# Patient Record
Sex: Male | Born: 1970 | Race: Black or African American | Hispanic: No | Marital: Married | Smoking: Never smoker
Health system: Southern US, Community
[De-identification: ages and names within clinical notes are randomized; demographics above are authoritative.]

## PROBLEM LIST (undated history)

## (undated) DIAGNOSIS — R030 Elevated blood-pressure reading, without diagnosis of hypertension: Secondary | ICD-10-CM

## (undated) DIAGNOSIS — I2699 Other pulmonary embolism without acute cor pulmonale: Secondary | ICD-10-CM

## (undated) DIAGNOSIS — K5080 Crohn's disease of both small and large intestine without complications: Secondary | ICD-10-CM

## (undated) DIAGNOSIS — I10 Essential (primary) hypertension: Secondary | ICD-10-CM

## (undated) DIAGNOSIS — E538 Deficiency of other specified B group vitamins: Secondary | ICD-10-CM

## (undated) DIAGNOSIS — D649 Anemia, unspecified: Secondary | ICD-10-CM

## (undated) DIAGNOSIS — F419 Anxiety disorder, unspecified: Secondary | ICD-10-CM

## (undated) HISTORY — DX: Anemia, unspecified: D64.9

## (undated) HISTORY — DX: Deficiency of other specified B group vitamins: E53.8

## (undated) HISTORY — DX: Crohn's disease of both small and large intestine without complications: K50.80

## (undated) HISTORY — PX: COLONOSCOPY: SHX174

---

## 1998-05-06 ENCOUNTER — Emergency Department (HOSPITAL_COMMUNITY): Admission: EM | Admit: 1998-05-06 | Discharge: 1998-05-06 | Payer: Self-pay | Admitting: Emergency Medicine

## 1998-05-06 ENCOUNTER — Encounter: Payer: Self-pay | Admitting: Emergency Medicine

## 2000-05-16 ENCOUNTER — Encounter (INDEPENDENT_AMBULATORY_CARE_PROVIDER_SITE_OTHER): Payer: Self-pay | Admitting: *Deleted

## 2000-05-17 ENCOUNTER — Encounter: Payer: Self-pay | Admitting: Emergency Medicine

## 2000-05-17 ENCOUNTER — Inpatient Hospital Stay (HOSPITAL_COMMUNITY): Admission: EM | Admit: 2000-05-17 | Discharge: 2000-05-22 | Payer: Self-pay | Admitting: *Deleted

## 2000-05-20 ENCOUNTER — Encounter: Payer: Self-pay | Admitting: Gastroenterology

## 2001-09-01 ENCOUNTER — Emergency Department (HOSPITAL_COMMUNITY): Admission: EM | Admit: 2001-09-01 | Discharge: 2001-09-01 | Payer: Self-pay | Admitting: Emergency Medicine

## 2002-08-15 ENCOUNTER — Encounter: Payer: Self-pay | Admitting: Emergency Medicine

## 2002-08-15 ENCOUNTER — Emergency Department (HOSPITAL_COMMUNITY): Admission: EM | Admit: 2002-08-15 | Discharge: 2002-08-15 | Payer: Self-pay | Admitting: Emergency Medicine

## 2002-10-04 ENCOUNTER — Encounter: Payer: Self-pay | Admitting: Gastroenterology

## 2005-01-22 ENCOUNTER — Ambulatory Visit: Payer: Self-pay | Admitting: Gastroenterology

## 2005-02-13 ENCOUNTER — Emergency Department (HOSPITAL_COMMUNITY): Admission: EM | Admit: 2005-02-13 | Discharge: 2005-02-14 | Payer: Self-pay | Admitting: Emergency Medicine

## 2005-02-25 ENCOUNTER — Ambulatory Visit: Payer: Self-pay | Admitting: Gastroenterology

## 2005-03-04 HISTORY — PX: OTHER SURGICAL HISTORY: SHX169

## 2005-03-05 ENCOUNTER — Inpatient Hospital Stay (HOSPITAL_COMMUNITY): Admission: EM | Admit: 2005-03-05 | Discharge: 2005-03-23 | Payer: Self-pay | Admitting: Emergency Medicine

## 2005-03-05 ENCOUNTER — Ambulatory Visit: Payer: Self-pay | Admitting: Gastroenterology

## 2005-03-08 ENCOUNTER — Encounter (INDEPENDENT_AMBULATORY_CARE_PROVIDER_SITE_OTHER): Payer: Self-pay | Admitting: Specialist

## 2005-03-08 ENCOUNTER — Encounter: Payer: Self-pay | Admitting: Gastroenterology

## 2005-03-09 ENCOUNTER — Encounter: Payer: Self-pay | Admitting: Gastroenterology

## 2005-03-16 ENCOUNTER — Encounter: Payer: Self-pay | Admitting: Gastroenterology

## 2005-03-16 ENCOUNTER — Encounter (INDEPENDENT_AMBULATORY_CARE_PROVIDER_SITE_OTHER): Payer: Self-pay | Admitting: Specialist

## 2005-03-21 ENCOUNTER — Encounter: Payer: Self-pay | Admitting: Gastroenterology

## 2005-03-30 ENCOUNTER — Ambulatory Visit: Payer: Self-pay | Admitting: Gastroenterology

## 2005-04-13 ENCOUNTER — Ambulatory Visit: Payer: Self-pay | Admitting: Gastroenterology

## 2005-04-15 ENCOUNTER — Encounter: Payer: Self-pay | Admitting: Gastroenterology

## 2005-04-26 ENCOUNTER — Ambulatory Visit: Payer: Self-pay | Admitting: Gastroenterology

## 2005-05-10 ENCOUNTER — Ambulatory Visit: Payer: Self-pay | Admitting: Gastroenterology

## 2005-05-20 ENCOUNTER — Ambulatory Visit: Payer: Self-pay | Admitting: Internal Medicine

## 2005-05-24 ENCOUNTER — Ambulatory Visit: Payer: Self-pay | Admitting: Gastroenterology

## 2005-05-25 ENCOUNTER — Ambulatory Visit: Payer: Self-pay | Admitting: Gastroenterology

## 2005-06-21 ENCOUNTER — Ambulatory Visit: Payer: Self-pay | Admitting: Internal Medicine

## 2005-06-22 ENCOUNTER — Ambulatory Visit: Payer: Self-pay | Admitting: Gastroenterology

## 2005-06-23 ENCOUNTER — Ambulatory Visit: Payer: Self-pay | Admitting: Gastroenterology

## 2005-06-30 ENCOUNTER — Ambulatory Visit: Payer: Self-pay | Admitting: Internal Medicine

## 2005-08-04 HISTORY — PX: ILEOSTOMY CLOSURE: SHX1784

## 2005-08-09 ENCOUNTER — Encounter (INDEPENDENT_AMBULATORY_CARE_PROVIDER_SITE_OTHER): Payer: Self-pay | Admitting: Specialist

## 2005-08-09 ENCOUNTER — Inpatient Hospital Stay (HOSPITAL_COMMUNITY): Admission: RE | Admit: 2005-08-09 | Discharge: 2005-08-18 | Payer: Self-pay | Admitting: General Surgery

## 2005-08-30 ENCOUNTER — Ambulatory Visit: Payer: Self-pay | Admitting: Gastroenterology

## 2005-08-31 ENCOUNTER — Ambulatory Visit: Payer: Self-pay | Admitting: Internal Medicine

## 2005-10-28 ENCOUNTER — Ambulatory Visit: Payer: Self-pay | Admitting: Gastroenterology

## 2006-07-20 ENCOUNTER — Ambulatory Visit: Payer: Self-pay | Admitting: Internal Medicine

## 2006-09-20 ENCOUNTER — Ambulatory Visit: Payer: Self-pay | Admitting: Internal Medicine

## 2006-09-21 ENCOUNTER — Ambulatory Visit: Payer: Self-pay | Admitting: Gastroenterology

## 2006-09-21 LAB — CONVERTED CEMR LAB
ALT: 21 units/L (ref 0–53)
AST: 18 units/L (ref 0–37)
Basophils Absolute: 0.2 10*3/uL — ABNORMAL HIGH (ref 0.0–0.1)
Basophils Relative: 3.3 % — ABNORMAL HIGH (ref 0.0–1.0)
CO2: 34 meq/L — ABNORMAL HIGH (ref 19–32)
Calcium: 9.1 mg/dL (ref 8.4–10.5)
Creatinine, Ser: 1.3 mg/dL (ref 0.4–1.5)
Eosinophils Absolute: 0.2 10*3/uL (ref 0.0–0.6)
Eosinophils Relative: 4 % (ref 0.0–5.0)
GFR calc non Af Amer: 67 mL/min
HCT: 39.3 % (ref 39.0–52.0)
Hemoglobin: 13.8 g/dL (ref 13.0–17.0)
MCV: 82.1 fL (ref 78.0–100.0)
Monocytes Absolute: 0.6 10*3/uL (ref 0.2–0.7)
RBC: 4.78 M/uL (ref 4.22–5.81)
Sodium: 143 meq/L (ref 135–145)
Total Bilirubin: 1.6 mg/dL — ABNORMAL HIGH (ref 0.3–1.2)
Total Protein: 6.7 g/dL (ref 6.0–8.3)
WBC: 4.6 10*3/uL (ref 4.5–10.5)

## 2007-02-08 ENCOUNTER — Ambulatory Visit: Payer: Self-pay | Admitting: Gastroenterology

## 2007-02-08 LAB — CONVERTED CEMR LAB
Albumin: 3.7 g/dL (ref 3.5–5.2)
Basophils Absolute: 0.1 10*3/uL (ref 0.0–0.1)
Basophils Relative: 1.6 % — ABNORMAL HIGH (ref 0.0–1.0)
CO2: 33 meq/L — ABNORMAL HIGH (ref 19–32)
Eosinophils Absolute: 0.1 10*3/uL (ref 0.0–0.6)
Eosinophils Relative: 2.9 % (ref 0.0–5.0)
Folate: 5.8 ng/mL
GFR calc Af Amer: 74 mL/min
Glucose, Bld: 90 mg/dL (ref 70–99)
Neutro Abs: 3.1 10*3/uL (ref 1.4–7.7)
Potassium: 4.1 meq/L (ref 3.5–5.1)
RDW: 13.8 % (ref 11.5–14.6)
Sodium: 140 meq/L (ref 135–145)
Total Bilirubin: 1.4 mg/dL — ABNORMAL HIGH (ref 0.3–1.2)
Vitamin B-12: 279 pg/mL (ref 211–911)

## 2007-08-03 ENCOUNTER — Ambulatory Visit: Payer: Self-pay | Admitting: Gastroenterology

## 2007-08-03 DIAGNOSIS — K5080 Crohn's disease of both small and large intestine without complications: Secondary | ICD-10-CM

## 2007-08-03 LAB — CONVERTED CEMR LAB
Albumin: 3.8 g/dL (ref 3.5–5.2)
Alkaline Phosphatase: 59 units/L (ref 39–117)
Basophils Absolute: 0 10*3/uL (ref 0.0–0.1)
Calcium: 9.1 mg/dL (ref 8.4–10.5)
Chloride: 104 meq/L (ref 96–112)
Eosinophils Absolute: 0.1 10*3/uL (ref 0.0–0.7)
Eosinophils Relative: 2.8 % (ref 0.0–5.0)
GFR calc Af Amer: 80 mL/min
GFR calc non Af Amer: 66 mL/min
HCT: 41.6 % (ref 39.0–52.0)
Hemoglobin: 14.3 g/dL (ref 13.0–17.0)
Lipase: 18 units/L (ref 11.0–59.0)
MCV: 83.1 fL (ref 78.0–100.0)
Monocytes Absolute: 0.5 10*3/uL (ref 0.1–1.0)
Neutro Abs: 3.1 10*3/uL (ref 1.4–7.7)
RBC: 5 M/uL (ref 4.22–5.81)
Vitamin B-12: 284 pg/mL (ref 211–911)

## 2008-03-19 ENCOUNTER — Telehealth: Payer: Self-pay | Admitting: Gastroenterology

## 2008-03-25 ENCOUNTER — Encounter (INDEPENDENT_AMBULATORY_CARE_PROVIDER_SITE_OTHER): Payer: Self-pay | Admitting: *Deleted

## 2008-04-25 ENCOUNTER — Ambulatory Visit: Payer: Self-pay | Admitting: Gastroenterology

## 2008-04-25 DIAGNOSIS — E538 Deficiency of other specified B group vitamins: Secondary | ICD-10-CM

## 2008-04-25 LAB — CONVERTED CEMR LAB
ALT: 20 units/L (ref 0–53)
AST: 20 units/L (ref 0–37)
Alkaline Phosphatase: 64 units/L (ref 39–117)
BUN: 9 mg/dL (ref 6–23)
Basophils Absolute: 0 10*3/uL (ref 0.0–0.1)
Creatinine, Ser: 1.2 mg/dL (ref 0.4–1.5)
GFR calc non Af Amer: 87.72 mL/min (ref >60)
Hemoglobin: 13.8 g/dL (ref 13.0–17.0)
Lipase: 25 units/L (ref 11.0–59.0)
Lymphocytes Relative: 14.5 % (ref 12.0–46.0)
Platelets: 234 10*3/uL (ref 150.0–400.0)
Potassium: 3.8 meq/L (ref 3.5–5.1)
RDW: 14 % (ref 11.5–14.6)
Sodium: 140 meq/L (ref 135–145)
Total Protein: 7 g/dL (ref 6.0–8.3)
Vitamin B-12: 252 pg/mL (ref 211–911)

## 2008-05-10 ENCOUNTER — Ambulatory Visit: Payer: Self-pay | Admitting: Internal Medicine

## 2008-06-18 ENCOUNTER — Ambulatory Visit: Payer: Self-pay | Admitting: Internal Medicine

## 2008-07-26 ENCOUNTER — Ambulatory Visit: Payer: Self-pay | Admitting: Internal Medicine

## 2008-08-30 ENCOUNTER — Ambulatory Visit: Payer: Self-pay | Admitting: Internal Medicine

## 2008-10-11 ENCOUNTER — Ambulatory Visit: Payer: Self-pay | Admitting: Internal Medicine

## 2008-12-24 ENCOUNTER — Telehealth: Payer: Self-pay | Admitting: Gastroenterology

## 2008-12-26 ENCOUNTER — Ambulatory Visit: Payer: Self-pay | Admitting: Internal Medicine

## 2008-12-26 ENCOUNTER — Ambulatory Visit: Payer: Self-pay | Admitting: Gastroenterology

## 2008-12-26 LAB — CONVERTED CEMR LAB
Basophils Absolute: 0 10*3/uL (ref 0.0–0.1)
CO2: 28 meq/L (ref 19–32)
Calcium: 9.2 mg/dL (ref 8.4–10.5)
Creatinine, Ser: 1.4 mg/dL (ref 0.4–1.5)
Eosinophils Relative: 1.6 % (ref 0.0–5.0)
Glucose, Bld: 98 mg/dL (ref 70–99)
Hemoglobin: 14 g/dL (ref 13.0–17.0)
Lipase: 12 units/L (ref 11.0–59.0)
MCHC: 33.2 g/dL (ref 30.0–36.0)
MCV: 85.5 fL (ref 78.0–100.0)
Monocytes Absolute: 0.4 10*3/uL (ref 0.1–1.0)
Monocytes Relative: 10.4 % (ref 3.0–12.0)
Potassium: 3.7 meq/L (ref 3.5–5.1)
Sodium: 138 meq/L (ref 135–145)
Total Bilirubin: 1 mg/dL (ref 0.3–1.2)
WBC: 3.5 10*3/uL — ABNORMAL LOW (ref 4.5–10.5)

## 2009-01-21 ENCOUNTER — Ambulatory Visit: Payer: Self-pay | Admitting: Gastroenterology

## 2009-01-21 DIAGNOSIS — E669 Obesity, unspecified: Secondary | ICD-10-CM | POA: Insufficient documentation

## 2009-01-24 ENCOUNTER — Ambulatory Visit: Payer: Self-pay | Admitting: Internal Medicine

## 2009-03-06 ENCOUNTER — Ambulatory Visit: Payer: Self-pay | Admitting: Internal Medicine

## 2009-05-14 ENCOUNTER — Ambulatory Visit: Payer: Self-pay | Admitting: Gastroenterology

## 2009-05-23 ENCOUNTER — Ambulatory Visit: Payer: Self-pay | Admitting: Internal Medicine

## 2009-07-25 ENCOUNTER — Ambulatory Visit: Payer: Self-pay | Admitting: Internal Medicine

## 2009-09-16 ENCOUNTER — Telehealth: Payer: Self-pay | Admitting: Gastroenterology

## 2009-09-16 ENCOUNTER — Encounter: Payer: Self-pay | Admitting: Gastroenterology

## 2009-09-19 ENCOUNTER — Ambulatory Visit: Payer: Self-pay | Admitting: Internal Medicine

## 2009-10-24 ENCOUNTER — Telehealth: Payer: Self-pay | Admitting: Gastroenterology

## 2009-11-03 ENCOUNTER — Ambulatory Visit: Payer: Self-pay | Admitting: Internal Medicine

## 2009-11-25 ENCOUNTER — Ambulatory Visit: Payer: Self-pay | Admitting: Gastroenterology

## 2009-12-01 LAB — CONVERTED CEMR LAB
BUN: 11 mg/dL (ref 6–23)
Basophils Absolute: 0 10*3/uL (ref 0.0–0.1)
Basophils Relative: 0.4 % (ref 0.0–3.0)
Bilirubin, Direct: 0.1 mg/dL (ref 0.0–0.3)
Calcium: 9.8 mg/dL (ref 8.4–10.5)
Chloride: 104 meq/L (ref 96–112)
Eosinophils Relative: 1.1 % (ref 0.0–5.0)
Glucose, Bld: 82 mg/dL (ref 70–99)
Lipase: 41 units/L (ref 11.0–59.0)
Neutro Abs: 3.6 10*3/uL (ref 1.4–7.7)
Neutrophils Relative %: 76.2 % (ref 43.0–77.0)
Potassium: 4.4 meq/L (ref 3.5–5.1)
RBC: 4.75 M/uL (ref 4.22–5.81)
Total Bilirubin: 1 mg/dL (ref 0.3–1.2)

## 2009-12-10 ENCOUNTER — Telehealth: Payer: Self-pay | Admitting: Gastroenterology

## 2010-01-25 ENCOUNTER — Encounter: Payer: Self-pay | Admitting: Gastroenterology

## 2010-02-03 NOTE — Assessment & Plan Note (Signed)
Summary: B12/ TLJ Bonney Leitz   Nurse Visit   Allergies: No Known Drug Allergies  Medication Administration  Injection # 1:    Medication: Vit B12 1000 mcg    Diagnosis: VITAMIN B12 DEFICIENCY (ICD-266.2)    Route: IM    Site: L deltoid    Exp Date: 04/2011    Lot #: [id number]    Mfr: American Regent    Patient tolerated injection without complications    Given by: Ami Bullins CMA (July 25, 2009 1:57 PM)  Orders Added: 1)  Admin of Therapeutic Inj  intramuscular or subcutaneous [96372] 2)  Vit B12 1000 mcg [M8413]

## 2010-02-03 NOTE — Assessment & Plan Note (Signed)
Summary: b-12 injection-lb   Nurse Visit   Allergies: No Known Drug Allergies  Medication Administration  Injection # 1:    Medication: Vit B12 1000 mcg    Diagnosis: VITAMIN B12 DEFICIENCY (ICD-266.2)    Route: IM    Site: R deltoid    Exp Date: 06/2011    Lot #: [id number]    Mfr: [place]    Patient tolerated injection without complications    Given by: Tomma Lightning RMA (September 19, 2009 8:26 AM)  Orders Added: 1)  Vit B12 1000 mcg [J3420] 2)  Admin of Therapeutic Inj  intramuscular or subcutaneous [92426]

## 2010-02-03 NOTE — Assessment & Plan Note (Signed)
Summary: MED REFILLS FOLLOW UP/YF   History of Present Illness Visit Type: Follow-up Visit Primary GI MD: Joylene Igo MD [facility] Primary Adar Rase: Scarlette Calico, MD Requesting [name] [name]: n/a Chief Complaint: Patient here for routine f/u crohns. He denies any GI problems at this time.  History of Present Illness:   Mr. 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 He is asymptomatic. His disease has been under excellent clinical control for several years.   GI Review of Systems      Denies abdominal pain, acid reflux, belching, bloating, chest pain, dysphagia with liquids, dysphagia with solids, heartburn, loss of appetite, nausea, vomiting, vomiting blood, weight loss, and  weight gain.        Denies anal fissure, black tarry stools, change in bowel habit, constipation, diarrhea, diverticulosis, fecal incontinence, heme positive stool, hemorrhoids, irritable bowel syndrome, jaundice, light color stool, liver problems, rectal bleeding, and  rectal pain.   Current Medications (verified): 1)  Mercaptopurine 50 Mg  Tabs (Mercaptopurine) .Marland Kitchen.. 1 1/2 Tablets By Mouth Once Daily....must Keep Your Office Visit. 2)  Cyanocobalamin 1000 Mcg/ml Soln (Cyanocobalamin) .... Inject 1 Ml Im Once Monthly  Allergies (verified): No Known Drug Allergies  Past History:  Past Medical History: Crohn's ileocolitis on 6MP since 2007 Iron defiency anemia Hemorrhoids B12 deficiency  Past Surgical History: Ileocecal resection 03/2005 Ileostomy/anastomotic resection for anastomotic leak 03/2005 Ileostomy closure 08/2005  Family History: Reviewed history from 04/25/2008 and no changes required. Family History of Breast Cancer:Mother No FH of Colon Cancer: Family History of Colitis/Crohn's: Cousins-crohn's Family History of Diabetes: Father  Social History: Single Patient has never smoked.  Alcohol Use - no Illicit Drug Use - no Patient does not get regular exercise.   Daily Caffeine Use sodas-4 cups daily  Review of Systems       The patient complains of back pain.  The patient denies allergy/sinus, anemia, anxiety-new, arthritis/joint pain, blood in urine, breast changes/lumps, change in vision, confusion, cough, coughing up blood, depression-new, fainting, fatigue, fever, headaches-new, hearing problems, heart murmur, heart rhythm changes, itching, menstrual pain, muscle pains/cramps, night sweats, nosebleeds, pregnancy symptoms, shortness of breath, skin rash, sleeping problems, sore throat, swelling of feet/legs, swollen lymph glands, thirst - excessive , urination - excessive , urination changes/pain, urine leakage, vision changes, and voice change.            Vital Signs:  Patient profile:   40 year old male Height:      69 inches Weight:      231 pounds BMI:     34.24 BSA:     2.20 Pulse rate:   80 / minute Pulse rhythm:   regular BP sitting:   100 / 64  (right arm)  Vitals Entered By: Madlyn Frankel CMA Deborra Medina) (November 25, 2009 9:46 AM)  Physical Exam  General:  Well developed, well nourished, no acute distress. Head:  Normocephalic and atraumatic. Eyes:  PERRLA, no icterus. Mouth:  No deformity or lesions, dentition normal. Lungs:  Clear throughout to auscultation. Heart:  Regular rate and rhythm; no murmurs, rubs,  or bruits. Abdomen:  Soft, nontender and nondistended. No masses, hepatosplenomegaly or hernias noted. Normal bowel sounds. Psych:  Alert and cooperative. Normal mood and affect.  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 Discussed discontinuing 6MP. He would like to rediscuss at his next visit. Colonoscopy to assess  for disease activity before considering stopping 6MP. Refill 6MP. Orders: TLB-BMP (Basic Metabolic Panel-BMET) (72761-OMQTTCN) TLB-Hepatic/Liver Function  Pnl (80076-HEPATIC) TLB-CBC Platelet - w/Differential (85025-CBCD) TLB-Lipase (83690-LIPASE)  Problem # 2:  VITAMIN B12 DEFICIENCY (ICD-266.2) Per PCP.  Patient Instructions: 1)  Get your labs drawn today in the basement.  2)  Your prescription has been sent to your pharmacy.  3)  Please schedule a follow-up appointment in 6 months. 4)  The medication list was reviewed and reconciled.  All changed / newly prescribed medications were explained.  A complete medication list was provided to the patient / caregiver.  Prescriptions: MERCAPTOPURINE 50 MG  TABS (MERCAPTOPURINE) 1 1/2 tablets by mouth once daily  #45 x 5   Entered by:   Marlon Pel CMA ([place])   Authorized by:   Ladene Artist MD [facility]   Signed by:   Marlon Pel CMA ([place]) on 11/25/2009   Method used:   Electronically to        [id number]* (retail)       [address]       [place], [place]  [address]       Ph: [phone] or [phone]       Fax: [phone]   RxID:   [id number]

## 2010-02-03 NOTE — Letter (Signed)
Summary: Appt Reminder [facility]  [place], [place] [address]   Phone: [phone]  Fax: [phone]        September 16, 2009 MRN: [id number]    [name] [name] [address] [place], [place]  [address]    Dear Mr. READING,   You have a return appointment with Dr. Fuller Plan on Sept 26,2011 at 9:15am.  Please remember to bring a complete list of the medicines you are taking, your insurance card and your co-pay.  If you have to cancel or reschedule this appointment, please call before 5:00 pm the evening before to avoid a $50.00 cancellation fee.  If you have any questions or concerns, please call [phone].    Sincerely, Irwin Brakeman [facility]

## 2010-02-03 NOTE — Assessment & Plan Note (Signed)
Summary: PER PT 1 MTH B12  TLJ  STC   Nurse Visit   Allergies: No Known Drug Allergies  Medication Administration  Injection # 1:    Medication: Vit B12 1000 mcg    Diagnosis: VITAMIN B12 DEFICIENCY (ICD-266.2)    Route: IM    Site: R deltoid    Exp Date: 08/05/2011    Lot #: [id number]    Mfr: American Regent    Patient tolerated injection without complications    Given by: Claudie Leach (November 03, 2009 8:28 AM)  Orders Added: 1)  Admin of Therapeutic Inj  intramuscular or subcutaneous [96372] 2)  Vit B12 1000 mcg [S3074]

## 2010-02-03 NOTE — Assessment & Plan Note (Signed)
Summary: b12/Valentine Barney/cd   Nurse Visit   Allergies: No Known Drug Allergies  Medication Administration  Injection # 1:    Medication: Vit B12 1000 mcg    Diagnosis: VITAMIN B12 DEFICIENCY (ICD-266.2)    Route: IM    Site: L deltoid    Exp Date: 11/05/2010    Lot #: [id number]    Mfr: American Regent    Patient tolerated injection without complications    Given by: Ernestene Mention (March 06, 2009 8:30 AM)  Orders Added: 1)  Vit B12 1000 mcg [J3420] 2)  Admin of Therapeutic Inj  intramuscular or subcutaneous [17510]

## 2010-02-03 NOTE — Assessment & Plan Note (Signed)
Summary: b-12 injection-lb   Nurse Visit   Allergies: No Known Drug Allergies  Medication Administration  Injection # 1:    Medication: Vit B12 1000 mcg    Diagnosis: VITAMIN B12 DEFICIENCY (ICD-266.2)    Route: IM    Site: L deltoid    Exp Date: 12/05/2010    Lot #: [id number]    Mfr: Susan Moore    Patient tolerated injection without complications    Given by: Charlsie Quest, CMA (May 23, 2009 9:22 AM)  Orders Added: 1)  Vit B12 1000 mcg [J3420] 2)  Admin of Therapeutic Inj  intramuscular or subcutaneous [96372]   Medication Administration  Injection # 1:    Medication: Vit B12 1000 mcg    Diagnosis: VITAMIN B12 DEFICIENCY (ICD-266.2)    Route: IM    Site: L deltoid    Exp Date: 12/05/2010    Lot #: [id number]    Mfr: American Regent    Patient tolerated injection without complications    Given by: Charlsie Quest, CMA (May 23, 2009 9:22 AM)  Orders Added: 1)  Vit B12 1000 mcg [J3420] 2)  Admin of Therapeutic Inj  intramuscular or subcutaneous [01586]

## 2010-02-03 NOTE — Progress Notes (Signed)
Summary: Medication refill   Phone Note Call from Patient Call back at Home Phone [phone]   Caller: Patient Call For: Dr. Fuller Plan Reason for Call: Refill Medication Summary of Call: needs refill on his MERCAPTOPURINE....was seen on 11-25-09 Initial call taken by: Webb Laws,  December 10, 2009 9:09 AM  Follow-up for Phone Call        Rx was sent to pts pharmacy again. Already sent on 12/08/09. Follow-up by: Marlon Pel CMA Deborra Medina),  December 10, 2009 9:17 AM    Prescriptions: MERCAPTOPURINE 50 MG  TABS (MERCAPTOPURINE) 1 1/2 tablets by mouth once daily  #45 x 5   Entered by:   Marlon Pel CMA ([place])   Authorized by:   Ladene Artist MD [facility]   Signed by:   Marlon Pel CMA ([place]) on 12/10/2009   Method used:   [facility]        [facility] [address] [phone][facility] (retail)       [address]       [place], [place]  [address]       Ph: [phone] or [phone]       Fax: [phone]   RxID:   [id number]

## 2010-02-03 NOTE — Progress Notes (Signed)
Summary: Med Refill   Phone Note Call from Patient   Call For: Dr Fuller Plan Reason for Call: Refill Medication Summary of Call: Scheduled next available on 09-29-09 can he please get his refill today? Does not need call back unless we are not refilling. Initial call taken by: Irwin Brakeman [facility],  September 16, 2009 8:24 AM  Follow-up for Phone Call        Dr. Fuller Plan patient no showed his July Appt which would have been his 6 month follow up. He has now made an appt for 09/29/2009. Is it ok to fill his 6MP? Follow-up by: Bernita Buffy CMA Deborra Medina),  September 16, 2009 11:11 AM  Additional Follow-up for Phone Call Additional follow up Details #1::        OK to refill until his appt later this month. Additional Follow-up by: Ladene Artist MD Marval Regal,  September 16, 2009 11:58 AM    Prescriptions: MERCAPTOPURINE 50 MG  TABS (MERCAPTOPURINE) 1 1/2 tablets by mouth once daily  #45 Tablet x 0   Entered by:   Bernita Buffy CMA ([place])   Authorized by:   Ladene Artist MD [facility]   Signed by:   Bernita Buffy CMA ([place]) on 09/16/2009   Method used:   Electronically to        [id number]* (retail)       [address]       [place], [place]  [address]       Ph: [phone] or [phone]       Fax: [phone]   RxID:   [id number]

## 2010-02-03 NOTE — Progress Notes (Signed)
Summary: refill request   Phone Note Call from Patient Call back at [phone]   Caller: Patient Call For: Dr. Fuller Plan Reason for Call: Refill Medication Summary of Call: needs refill of MERCAPTOPURINE before [facility]... [facility] [address] [facility] Initial call taken by: Lucien Mons,  October 24, 2009 9:28 AM  Follow-up for Phone Call        rx sent for one month and advised pt if he does not keep his office visit on 11/22 he will not be given any more rxs. Follow-up by: Bernita Buffy CMA ([place]),  October 24, 2009 9:45 AM    New/Updated Medications: MERCAPTOPURINE 50 MG  TABS (MERCAPTOPURINE) 1 1/2 tablets by mouth once daily....MUST KEEP YOUR OFFICE VISIT. Prescriptions: MERCAPTOPURINE 50 MG  TABS (MERCAPTOPURINE) 1 1/2 tablets by mouth once daily....MUST KEEP YOUR OFFICE VISIT.  #45 x 0   Entered by:   Bernita Buffy CMA ([place])   Authorized by:   Ladene Artist MD [facility]   Signed by:   Bernita Buffy CMA ([place]) on 10/24/2009   Method used:   [facility]        [facility] [address] [phone]* (retail)       [address]       [place], [place]  [address]       Ph: [phone] or [phone]       Fax: [phone]   RxID:   [id number]

## 2010-02-03 NOTE — Assessment & Plan Note (Signed)
Summary: 6 month f/u for Crohns disease/all   History of Present Illness Visit Type: follow up  Primary GI MD: Joylene Igo MD [facility] Primary Provider: Janith Lima MD Requesting Provider: n/a Chief Complaint: F/u for crohn's. Pt denies any GI complaints History of Present Illness:   This is a return office visit for followup of Crohn's disease. He is totally asymptomatic. Blood work from December showed a normal CBC, CMET, B12 and lipase except for mildly depressed white blood cell count at 3.5.   GI Review of Systems      Denies abdominal pain, acid reflux, belching, bloating, chest pain, dysphagia with liquids, dysphagia with solids, heartburn, loss of appetite, nausea, vomiting, vomiting blood, weight loss, and  weight gain.        Denies anal fissure, black tarry stools, change in bowel habit, constipation, diarrhea, diverticulosis, fecal incontinence, heme positive stool, hemorrhoids, irritable bowel syndrome, jaundice, light color stool, liver problems, rectal bleeding, and  rectal pain.   Current Medications (verified): 1)  Mercaptopurine 50 Mg  Tabs (Mercaptopurine) .Marland Kitchen.. 1 1/2 Tablets By Mouth Once Daily 2)  Nascobal 500 Mcg/0.23m Soln (Cyanocobalamin) .... One Puff Into A Nostril Once A Week  Allergies (verified): No Known Drug Allergies  Past History:  Past Medical History: Reviewed history from 04/25/2008 and no changes required. Crohn's ileocolitis Iron defiency anemia Hemorrhoids B12 deficiency  Past Surgical History: Reviewed history from 08/03/2007 and no changes required. Ileocecal resection 03/2005 Ileostomy/anastomotic resection for anastomotic leak 03/2005 Ileostomy closure 08/2005  Family History: Reviewed history from 04/25/2008 and no changes required. Family History of Breast Cancer:Mother No FH of Colon Cancer: Family History of Colitis/Crohn's: Cousins-crohn's Family History of Diabetes: Father  Social History: Reviewed history from  04/25/2008 and no changes required. Single Patient has never smoked.  Alcohol Use - no Illicit Drug Use - no Patient does not get regular exercise.  Daily Caffeine Use sodas  Review of Systems       The pertinent positives and negatives are noted as above and in the HPI. All other ROS were reviewed and were negative.   Vital Signs:  Patient profile:   40year old male Height:      69 inches Weight:      240 pounds BMI:     35.57 BSA:     2.23 Pulse rate:   88 / minute Pulse rhythm:   regular BP sitting:   120 / 76  (left arm) Cuff size:   regular  Vitals Entered By: KHope PigeonCMA (January 21, 2009 9:27 AM)  Physical Exam  General:  Well developed, well nourished, no acute distress. Head:  Normocephalic and atraumatic. Eyes:  PERRLA, no icterus. Mouth:  No deformity or lesions, dentition normal. Lungs:  Clear throughout to auscultation. Heart:  Regular rate and rhythm; no murmurs, rubs,  or bruits. Abdomen:  Soft, nontender and nondistended. No masses, hepatosplenomegaly or hernias noted. Normal bowel sounds. Psych:  Alert and cooperative. Normal mood and affect.  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 We discussed discontinuing 6-MP at his next visit. He understands the risk that 6-MP is working to maintain remission that he may have a flare. He also understands that if the disease is in remission on some 6-MP is not helping. Given that he is asymptomatic and his blood work is essentially normal I think it is reasonable to strongly  consider discontinuing 6-mercaptopurine in the near future.  Problem # 2:  OBESITY (ICD-278.00) He is encouraged to followup with his primary physician for a long-term weight-loss program.  Patient Instructions: 1)  Pick up your prescriptions at your pharmacy.  2)  Please schedule a follow-up appointment in 6  months. 3)  The medication list was reviewed and reconciled.  All changed / newly prescribed medications were explained.  A complete medication list was provided to the patient / caregiver.  Prescriptions: MERCAPTOPURINE 50 MG  TABS (MERCAPTOPURINE) 1 1/2 tablets by mouth once daily  #45 x 5   Entered by:   Marlon Pel CMA ([place])   Authorized by:   Ladene Artist MD [facility]   Signed by:   Ladene Artist MD FACG on 01/21/2009   Method used:   Electronically to        [id number]* (retail)       [address]       [place], [place]  [address]       Ph: [phone] or [phone]       Fax: [phone]   RxID:   [id number]

## 2010-02-03 NOTE — Assessment & Plan Note (Signed)
Summary: B12/[name] [name] - COMING AT 9AM/CD   Nurse Visit   Allergies: No Known Drug Allergies  Medication Administration  Injection # 1:    Medication: Vit B12 1000 mcg    Diagnosis: VITAMIN B12 DEFICIENCY (ICD-266.2)    Route: IM    Site: R deltoid    Exp Date: 06/05/2010    Lot #: [id number]    Mfr: [place]    Patient tolerated injection without complications    Given by: Ernestene Mention (January 24, 2009 9:07 AM)  Orders Added: 1)  Vit B12 1000 mcg [J3420] 2)  Admin of Therapeutic Inj  intramuscular or subcutaneous [82500]

## 2010-02-27 ENCOUNTER — Encounter: Payer: Self-pay | Admitting: Internal Medicine

## 2010-02-27 ENCOUNTER — Ambulatory Visit (INDEPENDENT_AMBULATORY_CARE_PROVIDER_SITE_OTHER): Payer: BC Managed Care – PPO

## 2010-02-27 DIAGNOSIS — H6120 Impacted cerumen, unspecified ear: Secondary | ICD-10-CM | POA: Insufficient documentation

## 2010-02-27 DIAGNOSIS — E538 Deficiency of other specified B group vitamins: Secondary | ICD-10-CM

## 2010-03-03 NOTE — Assessment & Plan Note (Signed)
Summary: PER PT B12 INJ  TLJ  STC   Nurse Visit   Allergies: No Known Drug Allergies  Medication Administration  Injection # 1:    Medication: Vit B12 1000 mcg    Diagnosis: VITAMIN B12 DEFICIENCY (ICD-266.2)    Route: IM    Site: R deltoid    Exp Date: 11/2011    Lot #: [id number]    Mfr: [place]    Patient tolerated injection without complications    Given by: Ami Bullins CMA (February 27, 2010 10:08 AM)  Orders Added: 1)  Admin of Therapeutic Inj  intramuscular or subcutaneous [96372] 2)  Vit B12 1000 mcg [J3420] 3)  Est. Patient Level I [35521]

## 2010-04-17 ENCOUNTER — Ambulatory Visit (INDEPENDENT_AMBULATORY_CARE_PROVIDER_SITE_OTHER): Payer: BC Managed Care – PPO | Admitting: *Deleted

## 2010-04-17 DIAGNOSIS — E538 Deficiency of other specified B group vitamins: Secondary | ICD-10-CM

## 2010-04-17 MED ORDER — CYANOCOBALAMIN 1000 MCG/ML IJ SOLN
1000.0000 ug | Freq: Once | INTRAMUSCULAR | Status: AC
  Administered 2010-04-17: 1000 ug via INTRAMUSCULAR

## 2010-05-19 NOTE — Assessment & Plan Note (Signed)
[facility] OFFICE NOTE   NAME:[name] [name], DETROIT FRIEDEN                     MRN:          [id number]  DATE:09/21/2006                            DOB:          March 04, 1970    This is a return office visit for Crohn's ileocolitis.  He is maintained  on 6MP.  He has not been seen in followup since October 2007.  He states  he has been doing very well.  He had 1 mild episode of diarrhea a few  days ago, but generally he has not had any abdominal pain, nausea,  vomiting, weight loss, anorexia, melena, hematochezia, or diarrhea.  His  appetite is excellent, and he has gained about 40 pounds since I last  saw him.   CURRENT MEDICATIONS:  1. 6 mercaptopurine 75 mg daily.  2. Vitamin B12 injection every other month.   MEDICATION ALLERGIES:  None known.   EXAM:  No acute distress.  Weight 228 pounds, up from 184.6 pounds in October 2007.  Blood pressure  100/68, pulse 72 and regular.  CHEST:  Clear to auscultation bilaterally.  CARDIAC:  Regular rate and rhythm without murmurs appreciated.  ABDOMEN:  Soft, nontender, nondistended, normoactive bowel sounds, no  palpable organomegaly, masses, or hernias.  His incisions are all well-  healed.   ASSESSMENT/PLAN:  Crohn's ileocolitis status post small bowel resection  complicated by an anastomotic leak requiring an ileostomy, and reversal  of his ileostomy.  He has done very well but unfortunately has gained  weight over the past 11 months.  He will remain on 6 mercaptopurine 75  mg daily; obtain a CMET, CBC, lipase, and TSH today.  He is advised to  modify his diet and excerise for a gradual weight loss. I advised him he  will need to have office visits every 3-6 months for ongoing management  while he remains on 6 mercaptopurine, and he understands this. Follow up  office visit in 3 months.     Pricilla Riffle. Fuller Plan, MD, [facility]  Electronically Signed    MTS/MedQ  DD:  09/23/2006  DT: 09/24/2006  Job #: [id number]

## 2010-05-19 NOTE — Assessment & Plan Note (Signed)
[facility] OFFICE NOTE   NAME:[name] [name], GERRIT RAFALSKI                     MRN:          [id number]  DATE:02/08/2007                            DOB:          [birth year]    This is a return office visit for Crohn's ileocolitis.  He is maintained  on 6-mercaptopurine and relates no gastrointestinal complaints.  He  noticed slight change in stool color with greenish stools in the past  few weeks.  He states he has been eating less healthy foods over the  past few weeks, particularly druing the NFL playoffs.  He notes no  diarrhea, abdominal pain, constipation, change in stool caliber, melena,  hematochezia, fevers, chills, or rashes.  He states his appetite is  good.   CURRENT MEDICATIONS:  6-mercaptopurine  75 mg daily.   MEDICATION ALLERGIES:  None known.   EXAM:  No acute distress.  Weight 234.2 pounds, up 6.2 pounds since his visit six months ago.  Blood pressure is 108/70, pulse 88 and regular.  CHEST:  Clear to auscultation bilaterally.  CARDIAC:  Regular rate and rhythm without murmurs.  ABDOMEN:  Soft, nontender, nondistended.  Normoactive bowel sounds.  No  palpable organomegaly, masses or hernias.   ASSESSMENT AND PLAN:  Crohn's ileocolitis, status post small bowel  resection, complicated by an anastomotic leak, requiring an ileostomy  and then reversal of his ileostomy.  He has done extremely well over the  past year, but unfortunately has gained a substantial amount of weight.  We will obtain a CBC, CMET, lipase and B12 today.  Renew 6-  mercaptopurine at 75 mg daily.  Return office visit six months.     Pricilla Riffle. Fuller Plan, MD, [facility]  Electronically Signed    MTS/MedQ  DD: 02/08/2007  DT: 02/08/2007  Job #: [id number]

## 2010-05-22 NOTE — H&P (Signed)
NAME:  [name] [name], [name] [name] NO.:  [id number]   MEDICAL RECORD NO.:  [id number]          PATIENT TYPE:  INP   LOCATION:  1843                         FACILITY:  [facility]   PHYSICIAN:  Milus Banister, M.D. DATE OF BIRTH:  03-10-1970   DATE OF ADMISSION:  03/05/2005  DATE OF DISCHARGE:                                HISTORY & PHYSICAL   USUAL GASTROENTEROLOGY PHYSICIAN:  Dr. Lucio Edward. The patient does not  have a primary care physician.   CHIEF COMPLAINT:  Weakness, anorexia, with a syncopal spell earlier today.   HISTORY OF PRESENT ILLNESS:  [name] [name] is a pleasant 40 year old African-  American gentleman with a history of Crohn's ileocolitis. He was diagnosed  in May 2002 with colonoscopy. At that study, there was colitis in the cecal  region and the IC valve was thickened and stenotic and he was unable to  visualize the terminal ileum. Also noted incidentally were internal and  external hemorrhoids. CT scan of the abdomen and pelvis showed right lower  quadrant inflammatory changes involving the cecum and terminal ileum. I do  not have an e-chart records of what medications he went home on but most  recently he has been on, but most recently he has been on Pentasa as well as  prednisone. The patient required an admission in 2005 at [facility] in [place]. For a period of time - maybe around 2006 - he was  off the Pentasa and prednisone, but for the last several months he has been  back on the Pentasa. Symptoms of bloating, anorexia, and the beginnings of  what is now a total of 25-30 pounds of weight loss began in December 2006.  He was seen in January and a couple of times in February by Dr. Fuller Plan.  Pentasa has been increased from 250 t.i.d. to 500 mg t.i.d. Prednisone is in  use on a recent taper, but now at 10 mg a day. When the patient was seen  last week by Dr. Fuller Plan, he was continuing to have symptoms so a CT scan was  ordered. This was  planned for today. The patient went to one of the  outpatient x-ray facilities and realized he was in the wrong place so he  went to another outpatient facility where while waiting for the procedure he  felt very weak and had a very brief syncopal spell. His father was with him  and says that the patient did not hit anything and the patient denies any  trauma. He was shipped over to the emergency room where he was evaluated by  Dr. Audie Pinto. His neurologic exam is negative. Dr. Audie [facility]into proceeded with the  CT scan of the abdomen and this is showing a distal small-bowel obstruction,  probably secondary to Crohn's disease. The patient's hemoglobin is 12.8 with  a borderline normal MCV. He is hyponatremic as well, with a sodium of 130.  Dr. Audie [facility] contacted [facility] for evaluation of the patient, and the plan  is to admit the patient for further care.   The patient specifically denies nausea or vomiting but  he does say that he  is very anorexic and really has to force himself to eat. He has had fatigue  and a little bit of dizziness, but this is not a chronic thing, and the  syncope today was certainly unusual for him.   The patient was seen in the emergency room on February 13, 2005. At that  time, he also displayed a small-bowel obstruction without evidence for any  perforation by plain films.   ALLERGIES:  None.   MEDICATIONS:  1.  Pentasa 250 mg two p.o. three times daily.  2.  Prednisone on a recent taper but now at 10 mg a day. The patient did run      out of his prednisone 2 days ago.   PAST MEDICAL HISTORY:  Crohn's ileocolitis diagnosed in 2002 as above.   SOCIAL HISTORY:  The patient lives in [place]. 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 No tobacco, no  alcohol.   FAMILY HISTORY:  Two of some distant cousins - I think they are third  cousins on his father's side - have Crohn's disease.   REVIEW OF SYSTEMS:  NEUROLOGIC:  No  headache, no visual changes, no loss of  memory. Did have the syncopal spell earlier today. GU:  Urine output is  perhaps diminished over average, no dysuria. MUSCULOSKELETAL:  Denies pain  in any of the limbs or trunk. HEMATOLOGIC:  No rectal bleeding, no unusual  bleeding or unexplained bruising. ENT:  No nose bleeds, no sinus congestion.  DERMATOLOGIC:  Denies sores or rashes. As far as bowel movements go, again,  they are nonbloody, they are kind of small, usually soft or loose. He has  not been constipated; he had a bowel movement yesterday.   LABORATORY DATA:  Initially on an i-STAT hemoglobin was 17 with hematocrit  of 50, but on recheck after some hydration his hemoglobin is 12.8,  hematocrit 38.6, MCV 78.7, and white blood cell count 3.6. Platelets 329,000  on a serum blood count. Sodium is 130, potassium is 3.6, chloride 99,  glucose 104, BUN is 8, creatinine 1.4.   CT scan as above.   PHYSICAL EXAMINATION:  VITAL SIGNS:  Temperature is 97.3, blood pressure  initially 112/77 with heart rate of 81, respirations 20. On recheck 3 hours  later, blood pressure is 109/72, pulse is 90.  GENERAL:  The patient is a pleasant, somewhat thin African-American young  gentleman who is in no distress. He is good historian. He is pleasant.  HEENT:  Sclerae nonicteric, conjunctivae are pink, extraocular movements are  intact. Oropharynx:  There is a little bit of yeast-like plaque on the  tongue. Mucous membranes are moist. Dentition in good repair.  NECK:  No JVD, no masses, no bruits.  CHEST:  Clear to auscultation and percussion bilaterally. There is no cough,  no shortness of breath.  CARDIOVASCULAR:  There is regular rate and rhythm. No murmurs, rubs, or  gallops.  ABDOMEN:  Distended and somewhat tense. There is mild diffuse tenderness. No  guarding or rebound. Bowel sounds are hyperactive and there is increased percussive tympany. No hepatosplenomegaly. No masses. There is a small  upper  midline ventral hernia.  GENITOURINARY AND RECTAL:  Not performed.  EXTREMITIES:  No clubbing, cyanosis, or edema.  DERMATOLOGIC:  No rashes, no open sores.  NEUROLOGIC:  The patient is alert and oriented x3. There is no tremor. Upper  and lower extremity strength is 5/5  bilaterally. He is entirely appropriate.   IMPRESSION:  1.  Crohn's ileocolitis with known stricturing disease at the terminal      ileum, now with bowel obstruction.  2.  Minor anemia with borderline low MCV.   PLAN:  The patient to be admitted to Dr. Owens Loffler' service for support  with IV fluids, NG tube suction, IV steroids, analgesics and antiemetics as  needed.      Azucena Freed, P.A. [facility]    ______________________________  Milus Banister, M.D.    SG/MEDQ  D:  03/05/2005  T:  03/05/2005  Job:  [id number]

## 2010-05-22 NOTE — H&P (Signed)
NAME:  [name] [name], [name] [name] NO.:  [id number]   MEDICAL RECORD NO.:  [id number]          PATIENT TYPE:  INP   LOCATION:  2899                         FACILITY:  [facility]   PHYSICIAN:  Odis Hollingshead, M.D.DATE OF BIRTH:  03-15-1970   DATE OF ADMISSION:  08/09/2005  DATE OF DISCHARGE:                                HISTORY & PHYSICAL   REASON:  Ileostomy closure.   HISTORY OF PRESENT ILLNESS:  [name] [name] is a 40 year old male who has  Crohn's disease and underwent an ileocecectomy secondary to obstruction from  his Crohn's disease.  This is complicated by an anastomotic leak requiring a  second operation one week later which included an ileostomy and then a long  Hartmann pouch.  This is about five months ago.  He has recovered and now  presents for ileostomy closure.   PAST MEDICAL HISTORY:  1. Crohn's disease.  2. Small bowel obstruction secondary to above.  3. Malnutrition.  4. Anemia.   PREVIOUS OPERATIONS:  1. Exploratory laparotomy, ileocecectomy.  2. Exploratory laparotomy, resection of ileocecectomy and ileostomy.   ALLERGIES:  None.   CURRENT MEDICATIONS:  6-MP, Pentasa, iron.   SOCIAL HISTORY:  No tobacco or alcohol use.  He normally is employed and  works two jobs, although has been off since he has had his surgeries.   REVIEW OF SYSTEMS:  Essentially unremarkable except for having ileostomy.   PHYSICAL EXAMINATION:  GENERAL:  A well-developed, well-nourished male who  is no acute distress, pleasant, cooperative.  VITAL SIGNS:  He is 5 feet 10 inches tall, weighs about 174 pounds.  His  temperature is 97.1, blood pressure is 126/71 with a pulse of 82.  O2  saturations 100% on room air.  HEENT:  Eyes:  Extraocular motions intact.  No icterus.  NECK:  Supple without obvious masses.  RESPIRATORY:  The breath sounds were equal and clear.  Respirations  unlabored.  CARDIOVASCULAR:  Regular rate and rhythm.  No murmur heard.  ABDOMEN:  Soft with a  well-healed midline scar and a right lower quadrant  stoma present with a little bit of skin breakdown around it.  MUSCULOSKELETAL:  He has got good range of motion, good muscle tone.   Laboratory data and x-ray have been reviewed.   IMPRESSION:  Crohn's disease appears to be quiescent and has ileostomy.  Presents for closure.   PLAN:  Exposure laparotomy with ileostomy closure.  The procedure and the  risks have been discussed with him preoperatively.      Odis Hollingshead, M.D.  Electronically Signed     TJR/MEDQ  D:  08/09/2005  T:  08/09/2005  Job:  [id number]

## 2010-05-22 NOTE — Consult Note (Signed)
NAME:  [name] [name], [name] [name]              ACCOUNT NO.:  [id number]   MEDICAL RECORD NO.:  [id number]          PATIENT TYPE:  INP   LOCATION:  5715                         FACILITY:  [facility]   PHYSICIAN:  Marland Kitchen T. Hoxworth, M.D.DATE OF BIRTH:  1970-10-25   DATE OF CONSULTATION:  03/05/2005  DATE OF DISCHARGE:                                   CONSULTATION   REFERRING PHYSICIAN:  Milus Banister, M.D.   REASON FOR CONSULTATION:  Terminal ileum stricture, inflammatory versus  fibrotic.   HISTORY OF PRESENT ILLNESS:  [name] [name] is a 40 year old male patient  known to [facility] from previous contacts.  He has a  history of Crohn's disease since 2002.  This was found to be focally located  in the terminal ileum.  He has been treated recently as an outpatient for  problems related to small bowel obstruction since December 2006.  This has  been treated with steroids orally and Pentasa.  KUB in February 2006  demonstrated a small bowel obstruction 7 cm dilatation.  A recent CT of the  abdomen confirmed high grade small bowel obstruction at the level of the  terminal ileum.  The patient has subsequently been admitted by GI for bowel  rest, IV flow hydration, IV steroids, and if no improvement in  symptomatology, believe that the patient will need to undergo small bowel  resection.  There is a high index of suspicion that despite having an  inflammatory component that fibrosis and fibrotic tissue is the most  significant issue causing the stricture here and feel that surgical  intervention will probably be the best treatment.   REVIEW OF SYSTEMS:  The patient has noticed increased weight loss, anorexia,  and abdominal distention since December 2006.  He has had to alter his diet  because of postprandial pain and bloating.  Many times he avoids food just  because of the discomfort it causes.  He has had no blood in his stools.  He  has not had any nausea or vomiting.   FAMILY HISTORY:  Positive for Crohn's in several cousins.   SOCIAL HISTORY:  He does not drink alcohol or use tobacco products.  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   PAST MEDICAL HISTORY:  Crohn's disease since 2002.   PAST SURGICAL HISTORY:  None.   ALLERGIES:  No known drug allergies.   CURRENT MEDICATIONS:  `Pentasa and prednisone taper.   PHYSICAL EXAMINATION:  GENERAL:  Pleasant male patient who is currently  being evaluated on the medical floor secondary to abdominal distention and  known small bowel obstruction.  VITAL SIGNS:  Temperature 97.3, blood pressure 109/72, pulse 90 and regular,  respirations 18.  NEUROLOGIC:  The patient is alert, oriented x3, moving all extremities x4.  No focal or neurologic deficits.  CARDIAC:  S1/S2.  No rubs, murmurs, thrills, no gallops.  No JVD.  CHEST:  Bilateral lung sounds are clear to auscultation.  Respiratory  nonlabored.  ABDOMEN:  Distended and tympanitic with high pitched bowel sounds over the  right middle quadrant, otherwise mostly quiet.  An NG has been placed to low  wall suction.  There is a small herniation in the umbilicus.  EXTREMITIES:  Symmetrical in appearance without any edema, cyanosis, or  clubbing.   LABORATORY DATA:  ESR is 3, white count is 3600, hemoglobin 12.8, hematocrit  38.6, platelets 329.  Sodium 134, potassium 3.8, CO2 28, BUN 6, creatinine  0.3.  LFTs are normal.  Albumin is 2.8.   DIAGNOSTICS:  A CT of the abdomen and pelvis has recently been done and  shows marked dilatation of the small bowel with a high grade obstruction of  the terminal ileum.  Colon is decompressed.  There is no free air.   IMPRESSION:  1.  Small bowel obstruction secondary to Crohn's disease focally located at      the terminal ileum.  2.  Fluid volume deficit secondary to small bowel obstruction.   PLAN:  Agree with GI's plans for bowel rest, aggressive medical treatment  with IV steroids including  rehydration.  Follow up within the next 2-3 days  if no improvement in symptoms.  Consider repeating diagnostic imaging such  as the CT versus proceeding directly with small bowel resection.  I have  discussed with this patient the surgical option portion including the  possibility that if the patient has significant parastrictural inflammation  that it may require an ileostomy.  Additional discussion regarding the  surgery and other recommendations per Dr. Excell Seltzer.      Plush Lissa Merlin, N.P.      Darene Lamer. Hoxworth, M.D.  Electronically Signed    ALE/MEDQ  D:  03/05/2005  T:  03/06/2005  Job:  [id number]   cc:   Milus Banister, M.D.

## 2010-05-22 NOTE — Op Note (Signed)
NAME:  [name] [name], [name] [name]              ACCOUNT NO.:  [id number]   MEDICAL RECORD NO.:  [id number]          PATIENT TYPE:  INP   LOCATION:  2550                         FACILITY:  [facility]   PHYSICIAN:  Odis Hollingshead, M.D.DATE OF BIRTH:  Apr 29, 1970   DATE OF PROCEDURE:  08/09/2005  DATE OF DISCHARGE:                                 OPERATIVE REPORT   PREOPERATIVE DIAGNOSIS:  Ileostomy and Crohn's disease.   POSTOPERATIVE DIAGNOSIS:  Ileostomy and Crohn's disease.   PROCEDURE:  Exploratory laparotomy, lysis of the effusions, resection of  distal lumen ileostomy and segment ascending colon and then ileostomy  closure.   SURGEON:  Odis Hollingshead, M.D.   ASSISTANT:  Edsel Petrin. Dalbert Batman, M.D.   ANESTHESIA:  General.   INDICATIONS:  This 40 year old male had a small bowel obstruction secondary  to Crohn's disease requiring two operations and he has an ileostomy and now  presents for closure.  We discussed the procedure and the risks  preoperatively.   TECHNIQUE:  He is seen in the holding area and brought to the operating  room, placed supine on the operating table and a general anesthetic was  administered.  The ileostomy device was removed and the skin cleaned around  the ileostomy.  The abdominal wall was sterilely prepped and draped.  A  Betadine-soaked sponge was placed over the ileostomy followed by an OpSite.  The midline skin incision was excised.  Subcutaneous tissue was divided with  the cautery.  The fascia was divided with the cautery.  Peritoneal cavity  was then entered under direct vision.  Omental adhesions were lysed from the  abdominal wall along the length of the incision exposing the peritoneal  cavity.  There were no signs of active Crohn's disease looking at the small  bowel.   The ileostomy was identified and adhesions between the ileum and abdominal  wall were divided.  I then made an elliptical incision on the skin where the  stomal was, carried this down  to the subcutaneous tissue and mobilized the  ileostomy and reduced it back into the peritoneal cavity.  I then used GIA  stapler and resected part of the distal ileum with the ileostomy and passed  this off the field.   Following this, I identified the transverse colon in the mid section and  traced it retrograde.  I lysed adhesions between the transverse colon and  the gallbladder and Gerota's fascia bringing up the stapled off end of the  transverse colon.  I then divided some mesentery between clamps and resected  portion of the ascending colon using the GIA stapler.  There was a little  bit of serosa left behind on the Gerota's fascia and I excised this with  cautery.   Following this, I performed a side-to-side anastomosis between the remaining  portion of the ileum and the ascending colon at approximately level to  hepatic flexure.  This was initially done with the GIA stapler.  The  remaining enterotomy was closed in two layers using a running 3-0 Vicryl  layer and an interrupted 3-0 silk layer in a Lembert type  fashion.  The  anastomosis was patent, viable and under no tension.  No leak was noted.   Following this, the gloves were changed and anastomosis reinspected.  Hemostasis was noted to be adequate.  Copious irrigation was performed with  warm saline solution and the fluid evacuated.  Sponge counts with needle and  instrument were reported to be correct at this time.  I subsequently closed  the fascial defect of the ileostomy initially internally by approximating  the posterior fascia with interrupted #1 PDS sutures.  The anterior fascia  was then approximated with interrupted #1 PDS sutures.  The midline incision  fascia was then closed with her running #1 PDS suture.  The subcutaneous  tissue of the midline wound and the ileostomy site were then irrigated  copiously.  Bleeding was controlled with a cautery.  The midline incision of  skin was closed with staples.  The  ileostomy skin was partially closed with  staples and I took two moistened gauze wicks and put them into part of the  wound.  A bulky dressing was applied.   He tolerated the procedure without any apparent complications and was taken  to the recovery room in satisfactory condition.      Odis Hollingshead, M.D.  Electronically Signed     TJR/MEDQ  D:  08/09/2005  T:  08/09/2005  Job:  [id number]   cc:   Pricilla Riffle. Fuller Plan, M.D. [facility]

## 2010-05-22 NOTE — Discharge Summary (Signed)
NAME:  [name] [name], [name] [name]              ACCOUNT NO.:  [id number]   MEDICAL RECORD NO.:  [id number]          PATIENT TYPE:  INP   LOCATION:  5715                         FACILITY:  [facility]   PHYSICIAN:  Fenton Malling. Lucia Gaskins, M.D.  DATE OF BIRTH:  1970/10/06   DATE OF ADMISSION:  03/05/2005  DATE OF DISCHARGE:  03/23/2005                                 DISCHARGE SUMMARY   SURGEON:  Odis Hollingshead, M.D.   CONSULTING SURGEON:  Imogene Burn. Tsuei, M.D.   CHIEF COMPLAINT AND REASON FOR ADMISSION:  [name] [name] is a 40 year old  male patient with known Crohn disease, has been having problems related to  chronic small bowel obstruction since December 2006, failed medical  treatment outpatient with oral steroids and Pentasa.  KUB in February  demonstrated a small bowel obstruction with a 7 cm dilatation at the  terminal ileum.  CT of the abdomen done prior to admission confirmed high-  grade small bowel obstruction at the level of the terminal ileum.  The  patient was admitted by GI for bowel rest, IV fluid hydration and IV  steroids with possible need for bowel resection due to fibrosis of the  terminal ileum.   ADMISSION DIAGNOSES:  1.  Small bowel obstruction secondary to Crohn's with focal location at the      terminal ileum.  2.  Fluid volume deficit secondary to bowel obstruction.   HOSPITAL COURSE:  The patient was admitted on the above-stated date.  Medical therapy for exacerbation of Crohn's and fluid volume deficit was  initiated.  He was on IV Solu-Medrol, kept on Pentasa and IV fluids and made  n.p.o.  He was bowel-rested over the weekend.  The symptoms had not improved  by March 08, 2005, so he was taken to the OR by Dr. Zella Richer, where he  underwent exploratory laparotomy, ileocecectomy and small bowel  decompression.  The patient had a significant amount of volume decompressed  from the small bowel.  An NG tube was inserted intraoperatively and was left  in place for several days  postoperatively due to the significant small bowel  decompression.  He was also kept on IV fluids at high rate for several days  postop.  In the immediate postop period he was started on cefoxitin.   The patient was slow to recover bowel function postoperatively.  Abdomen  remained flat.  NG with moderate to large amount of green returns.  Because  of preexisting hypoalbuminemia on admission, the patient also had been  started on TNA via a PICC line in the immediate postop period.  By postop  day 4, the patient's NG tube accidentally fell out.  Abdomen remained soft  and flat.  The patient had signs of postop ileus on x-rays and due to the  amount of small bowel decompression in the OR, diet was held and TNA was  continued.  By March 13, 2005, the patient was hungry and having no nausea,  felt the need to have a bowel movement, was beginning to pass gas.  A  Dulcolax suppository was given.  The patient was started on  clear liquids.  By March 11, postop day #6, the patient's diet was advanced to full liquid.  TNA was then weaned.   By March 15, 2005, which was postop day #7, the patient was afebrile, vital  signs were stable.  He was having no abdominal pain, no nausea or vomiting.  He began having bowel movements.  They were initially watery, progressing  now to more soft consistency.  He was advanced to a soft diet with plans to  discontinue the TNA if tolerated the diet, and expectations were to  discharge soon.  He was changed over to oral prednisone from IV Solu-Medrol  and prednisone taper initiated.   On postop day #8, the patient complained of increased abdominal pain  localized to the right side.  He noticed this after he began eating.  A CT  scan of the abdomen and pelvis was ordered for suspected anastomotic leak.  The results returned later this afternoon, and this did reveal an  anastomotic leak.  In the early morning hours of March 17, 2005, the patient  was taken back to  the OR by Dr. Georgette Dover, underwent exploratory laparotomy,  resection of ileocolic anastomosis, and ileostomy was performed.  The  patient tolerated the procedure well and was sent back to the general  surgical floor for recovery, noting that his white blood cell count was  20,400 preoperatively with a hemoglobin of 10.1 and creatinine of 0.8.   Postop day #1, the patient actually reported less pain in the postop period  compared to preoperatively the day before.  An NG tube was replaced at that  time during the most recent surgical procedure.  The patient was on IV  fluids, TNA was resumed.  He was subsequently started on Tygacil in the  immediate postoperative period.  His white count was down to 13,300.   The patient did well throughout the remaining postoperative days.  He  gradually regained bowel sounds.  Incision remained clean, dry, and intact.  He began to develop positive flatus, collection in the ostomy bag, and on  March 19, 2005, was started on a clear liquid diet.  His prednisone taper at  this point had been completed with the use of IV steroids.  The only problem  we were noticing was some mild tachycardia with associated anemia,  hemoglobin in the 8.4-9.2 range.  Anemia indices were checked and iron was  low at 11, TIBC 158, ferritin 258.  Hemoglobin on date of discharge was 9.2.  The patient had been advanced to a regular diet on March 22, 2005, and he  tolerated this well.  He has also been receiving ileostomy teaching per the  ostomy nurse and will receive his final instructions prior to discharge.  Per ostomy nurse's notes, the patient is working appropriately with the  ostomy bag.  On date of discharge the patient was afebrile, vital signs were  stable.  He was still mildly tachycardic.  Last prealbumin was checked on  March 19 and was 7.1.  The patient's TNA is now off.  He has a PICC line in the right upper extremity, which will be discontinued prior to discharge.  The  patient's staples are intact and these will need to come out sometime  next week at follow-up with Dr. Zella Richer.   FINAL DISCHARGE DIAGNOSES:  1.  Recent ileocecostomy on March 08, 2005, by Dr. Zella Richer.  2.  Status post ileocecectomy with ileostomy due to anastomotic leak.  This  was by Dr. Donnie Mesa on March 16 2005.  3.  Iron-deficiency anemia with low TIBC.  4.  Protein-calorie malnutrition with recent TNA.  5.  Leukocytosis, resolved.  6.  Crohn disease, post prednisone taper.   DISCHARGE MEDICATIONS:  1.  Pentasa 250 mg two capsules t.i.d.  2.  Iron sulfate 325 mg b.i.d.  3.  Percocet 5/325 mg one to two tablets every four hours as needed for      pain.  4.  Stop prednisone.   Return to work no sooner than six weeks after second surgery.   DIET:  No restrictions.   ACTIVITY:  Increase activity slowly.  May shower.  No driving for two weeks.  No lifting greater than five weeks.   WOUND CARE:  Return to our office for staple removal next week.   OTHER INSTRUCTIONS:  Routine ostomy care.  Home health requested for  supplies and any additional teaching.   FOLLOW-UP:  He is to see Dr. Fuller Plan of GI on March 27 at 1:30 p.m.  He is to  follow up with Dr. Zella Richer on Wednesday, March 28, at 10:45 a.m.      [place] Lissa Merlin, N.P.      Fenton Malling. Lucia Gaskins, M.D.  Electronically Signed    ALE/MEDQ  D:  03/23/2005  T:  03/24/2005  Job:  [id number]

## 2010-05-22 NOTE — H&P (Signed)
[facility]  Patient:    [name] [name], [name] [name]                       MRN: [id number] Adm. Date:  [id number] Attending:  Hilda Lias Dictator:   Huel Cote, P.A.-C.                         History and Physical  CHIEF COMPLAINT:  Acute right lower quadrant abdominal pain.  HISTORY OF PRESENT ILLNESS:  [name] [name] is a very nice 40 year old African-American male generally in good health with no known chronic medical problems.  At this time, he had acute onset on Sunday, May 12, with fever, sweats, and dizziness and generalized "flu" symptoms and anorexia.  This was not associated with any nausea or vomiting and no diarrhea; however, he started with lower abdominal pain at that time, which eventually settled into his right lower quadrant.  He describes the pain as being constant, worse with walking or moving, and crampy in nature.  He has had no radiation into his back.  He says he has had no appetite since onset of his pain.  Recently, over the past couple of months, however, his appetite has been fine.  His weight has been stable.  He has no complaint of chronic heartburn or indigestion.  No recent problems with diarrhea or melena.  He did have a black bowel movement yesterday, which was loose, but had taken Pepto-Bismol.  He has occasionally seen bright red blood on the tissue, which he has attributed to hemorrhoids.  He had one episode of abdominal pain in his lower abdomen in mid April, which lasted for approximately 24 hours and then resolved, and denies any chronic pain since that time.  At this time, he presents to the emergency room last evening due to progressive pain.  Evaluation in the emergency room with CT scan of the abdomen and pelvis shows an inflammatory mass in the right lower quadrant involving the cecum and terminal ileum, question Crohns versus infectious process.  LABORATORY DATA:  WBC 10.9, hemoglobin 13.8, hematocrit 42.1, MCV  74.3, potassium 3.1.  Electrolytes otherwise within normal limits and urinalysis is negative.  At this time, the patient is admitted for stabilization and further diagnostic workup.  CURRENT MEDICATIONS:  None regular.  Dimetapp and Pepto-Bismol p.r.n.  ALLERGIES:  No known drug allergies.  PAST MEDICAL HISTORY:  Pertinent for pneumonia in the 1980s.  No other known illnesses.  No hospitalizations or surgeries.  FAMILY HISTORY:  Negative for inflammatory bowel disease, colon cancer, or polyps.  Otherwise, noncontributory.  SOCIAL HISTORY:  The patient is single.  He is employed at [employer] in [employer].  No tobacco and no ETOH.  REVIEW OF SYSTEMS:  CARDIOVASCULAR:  Denies any chest pain or anginal symptoms.  PULMONARY:  Negative for cough, shortness of breath, or sputum production.  GENITOURINARY:  Negative for dysuria, urgency, or frequency.  GI: As above.  PHYSICAL EXAMINATION:  GENERAL:  A well-developed, healthy-appearing African-American male in no acute distress.  He is very warm to the touch.  VITAL SIGNS:  Temperature 101.7, blood pressure 117/69, pulse in the 90s, respirations 16.  HEENT:  Nontraumatic, normocephalic.  EOMI, PERRLA.  Sclerae anicteric. CARDIOVASCULAR:  Regular rate and rhythm with S1 and S2.  He is slightly tachycardic.  PULMONARY:  Clear to A&P.  ABDOMEN:  Soft.  Bowel sounds are present but hypoactive.  He is markedly tender  in the right lower quadrant with guarding and rebound.  There is no palpable mass or hepatosplenomegaly.  RECTAL:  Heme negative and without mass.  EXTREMITIES:  No clubbing, cyanosis, or edema.  Pulses are equal.  NEUROLOGIC:  Nonfocal.  IMPRESSION: 75. A 40 year old African-American male with acute right lower quadrant pain    and fever with CT findings consistent with inflammatory mass of the    terminal ileum and cecum, most consistent with Crohns.  Rule out    infectious etiology. 2. Hypokalemia secondary  to above. 3. Microcytic indices, rule out iron deficiency.  PLAN:  The patient is admitted to the service of Dr. Lucio Edward for IV fluid hydration, bowel rest.  We will cover him empirically with IV Solu-Medrol, IV Cipro, and Flagyl.  Check blood cultures, check iron studies, and plan eventual colonoscopy.  We will obtain surgical consultation if he deteriorates clinically.  DD:  05/17/00 TD:  05/17/00 Job: [id number]

## 2010-05-22 NOTE — Op Note (Signed)
NAME:  [name] [name], [name] [name]              ACCOUNT NO.:  [id number]   MEDICAL RECORD NO.:  [id number]          PATIENT TYPE:  INP   LOCATION:  5715                         FACILITY:  [facility]   PHYSICIAN:  Imogene Burn. Georgette Dover, M.D. DATE OF BIRTH:  1970/02/23   DATE OF PROCEDURE:  03/16/2005  DATE OF DISCHARGE:                                 OPERATIVE REPORT   PREOP DIAGNOSIS:  Anastomotic leak status post ileocecectomy.   POSTOPERATIVE DIAGNOSIS:  Anastomotic leak status post ileocecectomy.   PROCEDURE PERFORMED:  1.  Exploratory laparotomy.  2.  Resection of ileocolic anastomosis.  3.  Ileostomy.   SURGEON:  Imogene Burn. Georgette Dover, M.D.   ASSISTANT:  Dr. Rebekah Chesterfield.   ANESTHESIA:  General endotracheal.   INDICATIONS:  The patient is a 40 year old male with Crohn disease who is  eight days postop from an exploratory laparotomy with ileocecectomy for a  small bowel obstruction. This was performed on 03/08/2005 by Dr. Zella Richer.  The patient had his distal ileum and cecum resected with an ileocolonic  staple anastomosis. The patient had been doing reasonably well and was  getting ready to be discharged when he began having more severe abdominal  pain and became intolerant of his diet. He underwent a CT scan which showed  some retroperitoneal free air and inflammation in the right lower quadrant  consistent with an anastomotic leak. We discussed the situation with the  patient and we recommended exploration with creation of ileostomy. This  surgery was performed on call on the night of 03/16/2005.   DESCRIPTION OF PROCEDURE:  The patient is brought to the operating room,  placed supine position on operating table. After an adequate level of  general endotracheal anesthesia was obtained, a Foley catheter was placed  under sterile technique. The patient's abdomen was prepped with Betadine and  draped in sterile fashion. A time-out was taken to ensure the proper  patient, proper procedure. The staples  were removed and then the patient's  incision was opened. The sutures were removed from the fascia. There were  some flimsy adhesions to the undersurface of the abdominal wall. The small  bowel appeared very dilated. There were a lot of inflammatory adhesions but  these were all fairly flimsy and were broken up manually. We identified the  ligament of Treitz and ran the bowel distally. There was some fibrinous  exudate on the small bowel but no pockets of pus. The proximal small bowel  was relatively normal in appearance and caliber; however, the ileum was very  dilated, very thickened and diseased. We mobilized the ileocecal  anastomosis. The mesenteric defect which had previously had been closed  appeared to have dehisced. There is no obvious bowel obstruction at this  point. However, a small leak was noted coming from the staple line  anteriorly. This area was closed with a temporary pursestring Vicryl suture.  The tails were left long. We then examined the remainder of the colon and  beginning at the hepatic flexure, the transverse colon appeared relatively  normal. A GIA 75 stapler was used to divide the ascending colon in the  midportion.  This was left as a long Hartmann's pouch.  There was no evidence  of distal obstruction. The terminal ileum was divided with another reload of  the GIA stapler proximal to the ileocolic anastomosis. The specimen was  removed and sent for pathologic examination and is identified as ileocolic  anastomosis with perforation.   At this point we then began planning our ileostomy. The ileum was quite  dilated and thickened. The terminal ileum measured approximately 6 cm in  diameter. In an attempt to decrease the size of the ileostomy, a GIA stapler  was fired longitudinally down the terminal ileum. This created a short  narrower tube. The remaining portion of the small bowel at the mesenteric  border was resected with another firing the GIA stapler. The  skin incision  was created lateral to the umbilicus by grasping the skin with a Kocher  clamp. The subcutaneous fat was excised and the fascia was opened to allow  two fingers to pass. The newly created smaller segment of ileum was brought  up through the ostomy opening and held with a Babcock clamp. The staple line  was opened. A Poole suction catheter was inserted down the terminal ileum.  We then milked a lot of the air and fluid from the ileum to the suction.  This allowed adequate decompression of the dilated small bowel to allow all  the small bowel to be replaced in abdomen. The abdomen was then thoroughly  irrigated with warm saline. The fascia was closed with a #1 PDS suture.  Staples were loosely placed in the skin. The ileostomy was then trimmed and  matured with interrupted 4-0 Vicryl sutures. An ostomy appliance was cut to  fit. Dry dressing was placed over the staples in the midline. The patient  was then extubated and brought to recovery in stable condition. All sponge,  instrument and needle counts correct.      Imogene Burn. Tsuei, M.D.  Electronically Signed     MKT/MEDQ  D:  03/17/2005  T:  03/18/2005  Job:  [id number]

## 2010-05-22 NOTE — Discharge Summary (Signed)
NAME:  [name] [name], [name] [name]              ACCOUNT NO.:  [id number]   MEDICAL RECORD NO.:  [id number]          PATIENT TYPE:  INP   LOCATION:  9093                         FACILITY:  [facility]   PHYSICIAN:  Odis Hollingshead, M.D.DATE OF BIRTH:  [birth year]   DATE OF ADMISSION:  08/09/2005  DATE OF DISCHARGE:  08/18/2005                                 DISCHARGE SUMMARY   ADMISSION DATE:  August 09, 2005   DISCHARGE DATE:  August 18, 2005   PRINCIPAL DIAGNOSIS:  Ileostomy.   SECONDARY DIAGNOSIS:  Crohn disease.   PROCEDURE:  Ileostomy closure.   REASON FOR ADMISSION:  This is a 40 year old male who underwent an urgent  surgery because of small bowel obstruction secondary to Crohn disease in the  spring.  He has an ileostomy now, and is admitted for closure.   HOSPITAL COURSE:  He underwent the above procedure.  Postoperatively he did  have some ileus.  He had extensive lysis of adhesions.  He had only  partially closed the stomal wound, placed a dressing on it.  We restarted  his Crohn's medications.  By his ninth postoperative day, his ileus had  resolved, he is tolerating diet, bowels were moving, and he was discharged.   DISPOSITION:  Discharged to home August 18, 2005.  He will perform wound  care at home and continue on Crohn's medications.  He will come back to see  me in the office for a short interval followup.  A prescription for pain  medicine was provided to him.  I have also arranged for home health care to  do his dressing changes on the stoma wound.      Odis Hollingshead, M.D.  Electronically Signed     TJR/MEDQ  D:  10/06/2005  T:  10/06/2005  Job:  [id number]

## 2010-05-22 NOTE — Op Note (Signed)
NAME:  [name] [name], [name] [name]              ACCOUNT NO.:  [id number]   MEDICAL RECORD NO.:  [id number]          PATIENT TYPE:  INP   LOCATION:  5715                         FACILITY:  [facility]   PHYSICIAN:  Odis Hollingshead, M.D.DATE OF BIRTH:  Aug 28, 1970   DATE OF PROCEDURE:  03/08/2005  DATE OF DISCHARGE:                                 OPERATIVE REPORT   PREOPERATIVE DIAGNOSIS:  Small bowel obstruction secondary to Crohn disease.   POSTOPERATIVE DIAGNOSIS:  Small bowel obstruction secondary to Crohn  disease.   PROCEDURE:  Exploratory laparotomy, ileocecectomy, small bowel  decompression.   SURGEON:  Rosenbower.   ASSISTANT:  Neldon Mc MD   ANESTHESIA:  General.   INDICATIONS:  This is a 40 year old male with known Crohn disease with a  progressive small bowel obstruction that has not improved with medical  therapy over the past 72 hours. He has a known stricture the terminal ileum  secondary to Crohn disease and is now brought to the operating room.   TECHNIQUE:  He is brought to the operating room and placed supine on the  table and general anesthetic was administered. Foley catheter was placed  the bladder. The hair in the abdominal wall was clipped the area sterilely  prepped and draped. Midline incision was made through the skin, subcutaneous  tissue and fascia. Small bowel was very distended and eviscerated out  quickly once the peritoneum was opened. A very superficial cautery burn  affected part of the small bowel serosa and this was imbricated using 3-0  Vicryl Lembert type sutures.   Following this, I eviscerated the entire small bowel. Proximal small bowel  was relatively decompressed secondary to the NG tube, the distal small bowel  was massively distended. The right colon was decompressed. I began  mobilizing right colon by incising the lateral attachments. The right ureter  was identified and kept posterior to the plane of dissection. There are some  inflammatory adhesions to the lateral sidewall which I divided with  electrocautery thus bringing the distal terminal ileum and cecum up into the  wound. I then divided the distal ileum with a GIA stapler and divided the  right colon just beyond the cecal area. The mesentery was dissected and the  vessels to the divided between clamps and ligated.  The specimen was then  handed off the field.   Following this, I opened up part of the staple line and the small bowel and  began decompressing the small bowel of feculent type contents. Again  approximately a liter or so up from this way then became very thick and kept  occluding the suction tip.  At this point I then performed a side-to-side  anastomosis between the right colon and distal ileum in stapled fashion. The  remaining enterotomy was closed with a linear noncutting stapler.  Anastomosis was patent, viable and under no tension.   Gloves were changed. I then spent the next 45 minutes to 1 hour  decompressing the small bowel milking all these contents back into the  stomach and evacuated them. Once I had done this the small bowel was  decompressed and this was going to allow [place] to close the abdomen as without  this I may have had trouble closing the abdomen. No small bowel injuries  were made. Hemostasis was adequate.   The abdominal cavity was then irrigated and fluid evacuated. Needle, sponge  and instrument counts were reported to be correct at this time. I then  closed the fascia with a running #1 PDS suture. Subcutaneous tissue was  irrigated. Skin was closed staples.   He tolerated the procedure without any apparent complications and was taken  to recovery in satisfactory condition. I suspect he is at risk for  significant prolonged ileus.      Odis Hollingshead, M.D.  Electronically Signed     TJR/MEDQ  D:  03/08/2005  T:  03/09/2005  Job:  [id number]   cc:   Milus Banister, M.D.

## 2010-05-22 NOTE — Assessment & Plan Note (Signed)
[facility] OFFICE NOTE   NAME:[name] [name] [name]                     MRN:          [id number]  DATE:10/28/2005                            DOB:          03/13/1970    This is a return office visit for Crohn's ileocolitis. He is status post  exploratory laparotomy, lysis of adhesions, resection of the distal  ileostomy and a segment of the ascending colon. His ileostomy was then  closed. The surgery was performed on August 09, 2005, by Dr. Barkley Bruns and  assisted by Dr. Dalbert Batman. He has had a excellent recovery and feels well right  now. His weight is 184.6 pounds, which he states has been a pretty good  baseline weight for him over the past several years. He has no  gastrointestinal complaints whatsoever.   CURRENT MEDICATIONS:  __________ 75 mg daily.   ALLERGIES:  None known.   PHYSICAL EXAMINATION:  GENERAL: No acute distress.  VITAL SIGNS: Weight 184.6, blood pressure 100/60, pulse 72 and regular.  CHEST: Clear to auscultation bilaterally.  CARDIAC: Regular rate and rhythm without murmurs.  ABDOMEN: Soft, non-tender, non-distended. Normal active bowel sounds. No  palpable organomegaly, masses or hernias. Well healed incisions consistent  with his surgical history.   ASSESSMENT AND PLAN:  Crohn's ileocolitis, status post small bowel resection  and anastomotic leak requiring in ileostomy. He is now status post  additional resection of his ileostomy and closure of the ileostomy. He has  recovered very well from his surgery and his weight is at his baseline. He  has no gastrointestinal complaints. Continued __________  at 75 mg daily.  Obtain a CBC, C-met and lipase. Plan for routine office visits every three  months.     Pricilla Riffle. Fuller Plan, MD, [facility]    MTS/MedQ  DD: 10/28/2005  DT: 10/29/2005  Job #: [id number]   cc:   [name] [name], M.D.

## 2010-05-22 NOTE — Discharge Summary (Signed)
[facility]  Patient:    [name] [name], [name] [name]                       MRN: [id number] Adm. Date:  [id number] Disc. Date: [id number] Attending:  Hilda Lias Dictator:   Nicoletta Ba, P.A.-C.                           Discharge Summary  NO DICTATION. DD:  06/20/00 TD:  06/20/00 Job: [id number]

## 2010-06-17 ENCOUNTER — Encounter: Payer: Self-pay | Admitting: Gastroenterology

## 2010-07-13 ENCOUNTER — Ambulatory Visit: Payer: BC Managed Care – PPO | Admitting: Internal Medicine

## 2010-07-14 ENCOUNTER — Encounter: Payer: Self-pay | Admitting: Internal Medicine

## 2010-07-20 ENCOUNTER — Encounter: Payer: Self-pay | Admitting: Internal Medicine

## 2010-07-21 ENCOUNTER — Ambulatory Visit: Payer: BC Managed Care – PPO | Admitting: Internal Medicine

## 2010-07-21 DIAGNOSIS — Z0289 Encounter for other administrative examinations: Secondary | ICD-10-CM

## 2010-08-06 ENCOUNTER — Other Ambulatory Visit: Payer: Self-pay | Admitting: Gastroenterology

## 2010-08-06 NOTE — Telephone Encounter (Signed)
NEEDS OFFICE VISIT FOR ANY FURTHER REFILLS! 

## 2010-08-19 ENCOUNTER — Encounter: Payer: Self-pay | Admitting: Gastroenterology

## 2010-08-19 ENCOUNTER — Ambulatory Visit (INDEPENDENT_AMBULATORY_CARE_PROVIDER_SITE_OTHER): Payer: BC Managed Care – PPO | Admitting: Gastroenterology

## 2010-08-19 ENCOUNTER — Other Ambulatory Visit (INDEPENDENT_AMBULATORY_CARE_PROVIDER_SITE_OTHER): Payer: BC Managed Care – PPO

## 2010-08-19 VITALS — BP 110/70 | HR 80 | Ht 69.0 in | Wt 238.0 lb

## 2010-08-19 DIAGNOSIS — K5080 Crohn's disease of both small and large intestine without complications: Secondary | ICD-10-CM

## 2010-08-19 LAB — CBC WITH DIFFERENTIAL/PLATELET
Eosinophils Relative: 1.8 % (ref 0.0–5.0)
HCT: 43.4 % (ref 39.0–52.0)
Lymphocytes Relative: 14.9 % (ref 12.0–46.0)
Lymphs Abs: 0.5 10*3/uL — ABNORMAL LOW (ref 0.7–4.0)
Monocytes Absolute: 0.4 10*3/uL (ref 0.1–1.0)
Monocytes Relative: 11.3 % (ref 3.0–12.0)
Neutrophils Relative %: 71.8 % (ref 43.0–77.0)
RBC: 5.22 Mil/uL (ref 4.22–5.81)
WBC: 3.5 10*3/uL — ABNORMAL LOW (ref 4.5–10.5)

## 2010-08-19 LAB — HEPATIC FUNCTION PANEL
Alkaline Phosphatase: 62 U/L (ref 39–117)
Total Bilirubin: 1.1 mg/dL (ref 0.3–1.2)

## 2010-08-19 LAB — BASIC METABOLIC PANEL
BUN: 13 mg/dL (ref 6–23)
CO2: 32 mEq/L (ref 19–32)
Chloride: 108 mEq/L (ref 96–112)
GFR: 81.15 mL/min (ref >60.00)
Glucose, Bld: 94 mg/dL (ref 70–99)
Potassium: 5.1 mEq/L (ref 3.5–5.1)

## 2010-08-19 LAB — VITAMIN B12: Vitamin B-12: >1500 pg/mL — ABNORMAL HIGH (ref 211–911)

## 2010-08-19 MED ORDER — PEG-KCL-NACL-NASULF-NA ASC-C 100 G PO SOLR: 1.0000 | Freq: Once | ORAL | Status: DC

## 2010-08-19 NOTE — Patient Instructions (Signed)
You have been scheduled for a Colonoscopy. Separate instructions given. Suprep bowel kit has been given to patient.  Go directly to the basement to have your labs drawn today. cc: Scarlette Calico, MD

## 2010-08-19 NOTE — Progress Notes (Signed)
History of Present Illness: This is a 40 year old male with a history of Crohn's ileocolitis status post ileocecal resection who has been maintained on 6-mercaptopurine for years. He has no gastrointestinal complaints. He states he stopped receiving B12 injections and started on B12 pills several months ago.  Current Medications, Allergies, Past Medical History, Past Surgical History, Family History and Social History were reviewed in [employer] record.  Physical Exam: General: Well developed , well nourished, no acute distress Head: Normocephalic and atraumatic Eyes:  sclerae anicteric, EOMI Ears: Normal auditory acuity Mouth: No deformity or lesions Lungs: Clear throughout to auscultation Heart: Regular rate and rhythm; no murmurs, rubs or bruits Abdomen: Soft, non tender and non distended. No masses, hepatosplenomegaly or hernias noted. Normal Bowel sounds Rectal: Deferred to colonoscopy Musculoskeletal: Symmetrical with no gross deformities  Pulses:  Normal pulses noted Extremities: No clubbing, cyanosis, edema or deformities noted Neurological: Alert oriented x 4, grossly nonfocal Psychological:  Alert and cooperative. Normal mood and affect  Assessment and Recommendations:  1. Crohn's ileocolitis status post ileocecal resection maintained on 6-MP. He has had no gastrointestinal symptoms for quite some time. Plan to assess for disease activity with colonoscopy. If he has minimal or no disease apparent colonoscopy will plan to discontinue 6-mercaptopurine. He is in agreement with this plan. Standard blood work for 6-MP monitoring and B12 level. The risks, benefits, and alternatives to colonoscopy with possible biopsy and possible polypectomy were discussed with the patient and they consent to proceed.

## 2010-08-30 ENCOUNTER — Emergency Department (HOSPITAL_COMMUNITY)
Admission: EM | Admit: 2010-08-30 | Discharge: 2010-08-30 | Disposition: A | Discharge status: Home / Self Care | Payer: BC Managed Care – PPO | Attending: Emergency Medicine | Admitting: Emergency Medicine

## 2010-08-30 DIAGNOSIS — R55 Syncope and collapse: Secondary | ICD-10-CM | POA: Insufficient documentation

## 2010-08-30 DIAGNOSIS — E860 Dehydration: Secondary | ICD-10-CM | POA: Insufficient documentation

## 2010-08-30 LAB — CBC
HCT: 39 % (ref 39.0–52.0)
MCHC: 33.1 g/dL (ref 30.0–36.0)
MCV: 81.1 fL (ref 78.0–100.0)
Platelets: 257 10*3/uL (ref 150–400)
RDW: 14.3 % (ref 11.5–15.5)
WBC: 8.1 10*3/uL (ref 4.0–10.5)

## 2010-08-30 LAB — POCT I-STAT, CHEM 8
BUN: 12 mg/dL (ref 6–23)
Calcium, Ion: 1.2 mmol/L (ref 1.12–1.32)
Chloride: 105 mEq/L (ref 96–112)
Glucose, Bld: 114 mg/dL — ABNORMAL HIGH (ref 70–99)
TCO2: 26 mmol/L (ref 0–100)

## 2010-09-08 ENCOUNTER — Other Ambulatory Visit: Payer: Self-pay | Admitting: Gastroenterology

## 2010-09-11 ENCOUNTER — Other Ambulatory Visit: Payer: Self-pay | Admitting: Gastroenterology

## 2010-09-16 ENCOUNTER — Telehealth: Payer: Self-pay | Admitting: Gastroenterology

## 2010-09-16 NOTE — Telephone Encounter (Signed)
I have spoken with the patient .  He was provided a sample of suprep while he was in the office and a rx for Moviprep was sent into the pharmacy also.  I have offered to get him instructions for Moviprep, but he is unable to come pick up and there is not enough time to put it in the mail, he does not have access to a fax machine.  He says he will contact [facility] and see it they will take the kit back.

## 2010-09-18 ENCOUNTER — Ambulatory Visit (AMBULATORY_SURGERY_CENTER): Payer: BC Managed Care – PPO | Admitting: Gastroenterology

## 2010-09-18 ENCOUNTER — Encounter: Payer: Self-pay | Admitting: Gastroenterology

## 2010-09-18 VITALS — BP 106/72 | HR 71 | Temp 97.9°F | Resp 20 | Ht 69.0 in | Wt 238.0 lb

## 2010-09-18 DIAGNOSIS — K5090 Crohn's disease, unspecified, without complications: Secondary | ICD-10-CM

## 2010-09-18 DIAGNOSIS — D126 Benign neoplasm of colon, unspecified: Secondary | ICD-10-CM

## 2010-09-18 DIAGNOSIS — D1330 Benign neoplasm of unspecified part of small intestine: Secondary | ICD-10-CM

## 2010-09-18 DIAGNOSIS — K5080 Crohn's disease of both small and large intestine without complications: Secondary | ICD-10-CM

## 2010-09-18 MED ORDER — SODIUM CHLORIDE 0.9 % IV SOLN: 500.0000 mL | INTRAVENOUS | Status: DC

## 2010-09-18 NOTE — Patient Instructions (Signed)
FOLLOW YOUR DISCHARGE INSTRUCTIONS.   CONTINUE YOUR MEDICATIONS.  AWAIT BIOPSY RESULTS.

## 2010-09-21 ENCOUNTER — Telehealth: Payer: Self-pay

## 2010-09-21 NOTE — Telephone Encounter (Signed)

## 2010-09-25 ENCOUNTER — Encounter: Payer: Self-pay | Admitting: Gastroenterology

## 2011-09-17 ENCOUNTER — Encounter: Payer: Self-pay | Admitting: Gastroenterology

## 2011-09-17 ENCOUNTER — Other Ambulatory Visit (INDEPENDENT_AMBULATORY_CARE_PROVIDER_SITE_OTHER): Payer: BC Managed Care – PPO

## 2011-09-17 ENCOUNTER — Ambulatory Visit (INDEPENDENT_AMBULATORY_CARE_PROVIDER_SITE_OTHER): Payer: BC Managed Care – PPO | Admitting: Gastroenterology

## 2011-09-17 VITALS — BP 106/72 | HR 64 | Ht 70.5 in | Wt 236.6 lb

## 2011-09-17 DIAGNOSIS — K5080 Crohn's disease of both small and large intestine without complications: Secondary | ICD-10-CM

## 2011-09-17 DIAGNOSIS — R5383 Other fatigue: Secondary | ICD-10-CM

## 2011-09-17 DIAGNOSIS — E538 Deficiency of other specified B group vitamins: Secondary | ICD-10-CM

## 2011-09-17 DIAGNOSIS — R5381 Other malaise: Secondary | ICD-10-CM

## 2011-09-17 LAB — CBC WITH DIFFERENTIAL/PLATELET
Basophils Absolute: 0 10*3/uL (ref 0.0–0.1)
Basophils Relative: 0.4 % (ref 0.0–3.0)
Eosinophils Absolute: 0.1 10*3/uL (ref 0.0–0.7)
Lymphocytes Relative: 17.1 % (ref 12.0–46.0)
MCHC: 32.7 g/dL (ref 30.0–36.0)
MCV: 81.2 fl (ref 78.0–100.0)
Monocytes Absolute: 0.5 10*3/uL (ref 0.1–1.0)
Neutrophils Relative %: 71.5 % (ref 43.0–77.0)
Platelets: 300 10*3/uL (ref 150.0–400.0)
RBC: 5.17 Mil/uL (ref 4.22–5.81)
RDW: 13.9 % (ref 11.5–14.6)

## 2011-09-17 LAB — BASIC METABOLIC PANEL
Calcium: 9.4 mg/dL (ref 8.4–10.5)
GFR: 77.86 mL/min (ref >60.00)
Glucose, Bld: 93 mg/dL (ref 70–99)
Potassium: 4.4 mEq/L (ref 3.5–5.1)
Sodium: 138 mEq/L (ref 135–145)

## 2011-09-17 LAB — HEPATIC FUNCTION PANEL
AST: 19 U/L (ref 0–37)
Albumin: 4 g/dL (ref 3.5–5.2)
Alkaline Phosphatase: 58 U/L (ref 39–117)
Total Bilirubin: 0.9 mg/dL (ref 0.3–1.2)

## 2011-09-17 LAB — TSH: TSH: 1.57 u[IU]/mL (ref 0.35–5.50)

## 2011-09-17 NOTE — Progress Notes (Signed)
History of Present Illness: This is a 41 year old male with a history of Crohn's ileocolitis status post ileocecectomy. He was treated with 6-MP for several years however it was discontinued when his disease was inactive for 3 or 4 years. He has done well off 6-MP. He complains of fatigue for the past several months. He was diagnosed with B12 deficiency and was treated with B12 replacement. He stopped seeing his primary care physician for B12 shots about 16 months ago. He states he had one episode of watery, nonbloody diarrhea last week but that subsided and he has not had any gastrointestinal complaints except for that one episode of diarrhea. Colonoscopy performed September 2012 was normal, except for right hemicolectomy and internal hemorrhoids, including normal biopsies. Denies weight loss, abdominal pain, constipation, change in stool caliber, melena, hematochezia, nausea, vomiting, dysphagia, reflux symptoms, chest pain.  Current Medications, Allergies, Past Medical History, Past Surgical History, Family History and Social History were reviewed in [employer] record.  Physical Exam: General: Well developed , well nourished, no acute distress Head: Normocephalic and atraumatic Eyes:  sclerae anicteric, EOMI Ears: Normal auditory acuity Mouth: No deformity or lesions Lungs: Clear throughout to auscultation Heart: Regular rate and rhythm; no murmurs, rubs or bruits Abdomen: Soft, non tender and non distended. No masses, hepatosplenomegaly or hernias noted. Normal Bowel sounds Musculoskeletal: Symmetrical with no gross deformities  Pulses:  Normal pulses noted Extremities: No clubbing, cyanosis, edema or deformities noted Neurological: Alert oriented x 4, grossly nonfocal Psychological:  Alert and cooperative. Normal mood and affect  Assessment and Recommendations:  1. Crohn's ileocolitis. No symptoms of active Crohn's disease. Obtain standard blood work.  2. History  of B12 deficiency on B12 injections for approximately 16 months. He may be B12 deficient again. Recheck B12 level and advised to return to primary physician for adequate long-term management of his B12 deficiency. I advised him on the need for long term B12 replacement given his prior surgery.  3. Fatigue. Obtain standard blood work. Followup with his primary physician.

## 2011-09-17 NOTE — Patient Instructions (Addendum)
Your physician has requested that you go to the basement for the following lab work before leaving today: Immunologist, Vitamin B12.  Please call  Dr. Scarlette Calico (Primary Care Physician) to schedule a physical appointment.  cc: Scarlette Calico, MD

## 2012-01-07 ENCOUNTER — Ambulatory Visit: Payer: BC Managed Care – PPO | Admitting: Internal Medicine

## 2012-06-15 ENCOUNTER — Ambulatory Visit: Payer: BC Managed Care – PPO | Admitting: Internal Medicine

## 2012-08-14 ENCOUNTER — Ambulatory Visit: Payer: BC Managed Care – PPO | Admitting: Internal Medicine

## 2012-08-14 DIAGNOSIS — Z0289 Encounter for other administrative examinations: Secondary | ICD-10-CM

## 2012-11-07 ENCOUNTER — Ambulatory Visit: Payer: BC Managed Care – PPO | Admitting: Gastroenterology

## 2013-02-27 ENCOUNTER — Emergency Department (HOSPITAL_COMMUNITY): Payer: Worker's Compensation | Admitting: Certified Registered"

## 2013-02-27 ENCOUNTER — Emergency Department (HOSPITAL_COMMUNITY): Payer: Worker's Compensation

## 2013-02-27 ENCOUNTER — Inpatient Hospital Stay (HOSPITAL_COMMUNITY)
Admission: EM | Admit: 2013-02-27 | Discharge: 2013-03-12 | DRG: 492 | Disposition: A | Discharge status: Skilled Nursing Facility | Payer: Worker's Compensation | Attending: Orthopedic Surgery | Admitting: Orthopedic Surgery

## 2013-02-27 ENCOUNTER — Encounter (HOSPITAL_COMMUNITY): Payer: Worker's Compensation | Admitting: Certified Registered"

## 2013-02-27 ENCOUNTER — Encounter (HOSPITAL_COMMUNITY): Payer: Self-pay | Admitting: Emergency Medicine

## 2013-02-27 ENCOUNTER — Encounter (HOSPITAL_COMMUNITY)
Admission: EM | Disposition: A | Discharge status: Skilled Nursing Facility | Payer: Self-pay | Source: Home / Self Care | Attending: Orthopedic Surgery

## 2013-02-27 DIAGNOSIS — D638 Anemia in other chronic diseases classified elsewhere: Secondary | ICD-10-CM | POA: Diagnosis present

## 2013-02-27 DIAGNOSIS — E291 Testicular hypofunction: Secondary | ICD-10-CM

## 2013-02-27 DIAGNOSIS — K1370 Unspecified lesions of oral mucosa: Secondary | ICD-10-CM | POA: Diagnosis present

## 2013-02-27 DIAGNOSIS — R000 Tachycardia, unspecified: Secondary | ICD-10-CM | POA: Diagnosis present

## 2013-02-27 DIAGNOSIS — I2699 Other pulmonary embolism without acute cor pulmonale: Secondary | ICD-10-CM | POA: Diagnosis not present

## 2013-02-27 DIAGNOSIS — K5080 Crohn's disease of both small and large intestine without complications: Secondary | ICD-10-CM | POA: Diagnosis present

## 2013-02-27 DIAGNOSIS — K560 Paralytic ileus: Secondary | ICD-10-CM | POA: Diagnosis present

## 2013-02-27 DIAGNOSIS — E349 Endocrine disorder, unspecified: Secondary | ICD-10-CM | POA: Diagnosis present

## 2013-02-27 DIAGNOSIS — D62 Acute posthemorrhagic anemia: Secondary | ICD-10-CM | POA: Diagnosis not present

## 2013-02-27 DIAGNOSIS — S82109A Unspecified fracture of upper end of unspecified tibia, initial encounter for closed fracture: Principal | ICD-10-CM | POA: Diagnosis present

## 2013-02-27 DIAGNOSIS — S82209A Unspecified fracture of shaft of unspecified tibia, initial encounter for closed fracture: Secondary | ICD-10-CM

## 2013-02-27 DIAGNOSIS — R030 Elevated blood-pressure reading, without diagnosis of hypertension: Secondary | ICD-10-CM | POA: Diagnosis present

## 2013-02-27 DIAGNOSIS — S82143A Displaced bicondylar fracture of unspecified tibia, initial encounter for closed fracture: Secondary | ICD-10-CM | POA: Diagnosis present

## 2013-02-27 DIAGNOSIS — E538 Deficiency of other specified B group vitamins: Secondary | ICD-10-CM | POA: Diagnosis present

## 2013-02-27 DIAGNOSIS — R040 Epistaxis: Secondary | ICD-10-CM | POA: Diagnosis present

## 2013-02-27 DIAGNOSIS — J690 Pneumonitis due to inhalation of food and vomit: Secondary | ICD-10-CM | POA: Diagnosis not present

## 2013-02-27 DIAGNOSIS — E559 Vitamin D deficiency, unspecified: Secondary | ICD-10-CM | POA: Diagnosis present

## 2013-02-27 DIAGNOSIS — K909 Intestinal malabsorption, unspecified: Secondary | ICD-10-CM | POA: Diagnosis present

## 2013-02-27 DIAGNOSIS — R7989 Other specified abnormal findings of blood chemistry: Secondary | ICD-10-CM

## 2013-02-27 DIAGNOSIS — E669 Obesity, unspecified: Secondary | ICD-10-CM | POA: Diagnosis present

## 2013-02-27 HISTORY — DX: Elevated blood-pressure reading, without diagnosis of hypertension: R03.0

## 2013-02-27 HISTORY — PX: EXTERNAL FIXATION LEG: SHX1549

## 2013-02-27 LAB — CBC WITH DIFFERENTIAL/PLATELET
BASOS ABS: 0 10*3/uL (ref 0.0–0.1)
Basophils Relative: 0 % (ref 0–1)
Eosinophils Absolute: 0 10*3/uL (ref 0.0–0.7)
Eosinophils Relative: 0 % (ref 0–5)
HCT: 41.5 % (ref 39.0–52.0)
Hemoglobin: 14.5 g/dL (ref 13.0–17.0)
LYMPHS ABS: 0.7 10*3/uL (ref 0.7–4.0)
LYMPHS PCT: 5 % — AB (ref 12–46)
MCH: 27.6 pg (ref 26.0–34.0)
MCHC: 34.9 g/dL (ref 30.0–36.0)
MCV: 78.9 fL (ref 78.0–100.0)
Monocytes Absolute: 0.9 10*3/uL (ref 0.1–1.0)
Monocytes Relative: 6 % (ref 3–12)
NEUTROS ABS: 12.3 10*3/uL — AB (ref 1.7–7.7)
NEUTROS PCT: 88 % — AB (ref 43–77)
PLATELETS: 276 10*3/uL (ref 150–400)
RBC: 5.26 MIL/uL (ref 4.22–5.81)
RDW: 14 % (ref 11.5–15.5)
WBC: 13.9 10*3/uL — ABNORMAL HIGH (ref 4.0–10.5)

## 2013-02-27 LAB — BASIC METABOLIC PANEL
BUN: 10 mg/dL (ref 6–23)
CALCIUM: 9.7 mg/dL (ref 8.4–10.5)
CO2: 25 mEq/L (ref 19–32)
Chloride: 102 mEq/L (ref 96–112)
Creatinine, Ser: 1.33 mg/dL (ref 0.50–1.35)
GFR calc Af Amer: 75 mL/min — ABNORMAL LOW (ref >90)
GFR calc non Af Amer: 65 mL/min — ABNORMAL LOW (ref >90)
GLUCOSE: 103 mg/dL — AB (ref 70–99)
POTASSIUM: 3.8 meq/L (ref 3.7–5.3)
Sodium: 141 mEq/L (ref 137–147)

## 2013-02-27 SURGERY — EXTERNAL FIXATION, LOWER EXTREMITY
Anesthesia: General | Site: Leg Upper | Laterality: Left

## 2013-02-27 MED ORDER — METHOCARBAMOL 100 MG/ML IJ SOLN
500.0000 mg | Freq: Four times a day (QID) | INTRAVENOUS | Status: DC | PRN
  Filled 2013-02-27: qty 5

## 2013-02-27 MED ORDER — NEOSTIGMINE METHYLSULFATE 1 MG/ML IJ SOLN
INTRAMUSCULAR | Status: AC
  Filled 2013-02-27: qty 10

## 2013-02-27 MED ORDER — 0.9 % SODIUM CHLORIDE (POUR BTL) OPTIME
TOPICAL | Status: DC | PRN
  Administered 2013-02-27: 1000 mL

## 2013-02-27 MED ORDER — OXYCODONE HCL 5 MG PO TABS
ORAL_TABLET | ORAL | Status: AC
  Filled 2013-02-27: qty 1

## 2013-02-27 MED ORDER — PROMETHAZINE HCL 25 MG/ML IJ SOLN: 6.2500 mg | INTRAMUSCULAR | Status: DC | PRN

## 2013-02-27 MED ORDER — OXYCODONE HCL 5 MG/5ML PO SOLN: 5.0000 mg | Freq: Once | ORAL | Status: AC | PRN

## 2013-02-27 MED ORDER — BUPIVACAINE-EPINEPHRINE PF 0.25-1:200000 % IJ SOLN
INTRAMUSCULAR | Status: AC
  Filled 2013-02-27: qty 30

## 2013-02-27 MED ORDER — HYDROMORPHONE HCL PF 1 MG/ML IJ SOLN
INTRAMUSCULAR | Status: AC
  Filled 2013-02-27: qty 1

## 2013-02-27 MED ORDER — FENTANYL CITRATE 0.05 MG/ML IJ SOLN
INTRAMUSCULAR | Status: AC
  Filled 2013-02-27: qty 5

## 2013-02-27 MED ORDER — LACTATED RINGERS IV SOLN
INTRAVENOUS | Status: DC | PRN
  Administered 2013-02-27 (×2): via INTRAVENOUS

## 2013-02-27 MED ORDER — ONDANSETRON HCL 4 MG/2ML IJ SOLN: 4.0000 mg | Freq: Four times a day (QID) | INTRAMUSCULAR | Status: DC | PRN

## 2013-02-27 MED ORDER — HYDROCODONE-ACETAMINOPHEN 7.5-325 MG PO TABS
1.0000 | ORAL_TABLET | ORAL | Status: DC | PRN
  Administered 2013-02-28: 2 via ORAL
  Filled 2013-02-27: qty 2

## 2013-02-27 MED ORDER — ONDANSETRON HCL 4 MG/2ML IJ SOLN
INTRAMUSCULAR | Status: AC
  Filled 2013-02-27: qty 2

## 2013-02-27 MED ORDER — KCL IN DEXTROSE-NACL 20-5-0.45 MEQ/L-%-% IV SOLN
INTRAVENOUS | Status: DC
  Administered 2013-02-27: 100 mL via INTRAVENOUS
  Administered 2013-02-28: 10:00:00 via INTRAVENOUS
  Filled 2013-02-27 (×3): qty 1000

## 2013-02-27 MED ORDER — METOCLOPRAMIDE HCL 5 MG/ML IJ SOLN
5.0000 mg | Freq: Three times a day (TID) | INTRAMUSCULAR | Status: DC | PRN
  Filled 2013-02-27: qty 2

## 2013-02-27 MED ORDER — ENOXAPARIN SODIUM 40 MG/0.4ML ~~LOC~~ SOLN
40.0000 mg | SUBCUTANEOUS | Status: DC
  Administered 2013-02-27 – 2013-03-09 (×10): 40 mg via SUBCUTANEOUS
  Filled 2013-02-27 (×13): qty 0.4

## 2013-02-27 MED ORDER — ONDANSETRON HCL 4 MG/2ML IJ SOLN
INTRAMUSCULAR | Status: DC | PRN
  Administered 2013-02-27: 4 mg via INTRAVENOUS

## 2013-02-27 MED ORDER — KCL IN DEXTROSE-NACL 20-5-0.45 MEQ/L-%-% IV SOLN
INTRAVENOUS | Status: AC
  Filled 2013-02-27: qty 1000

## 2013-02-27 MED ORDER — ONDANSETRON 4 MG PO TBDP
8.0000 mg | ORAL_TABLET | Freq: Once | ORAL | Status: AC
  Administered 2013-02-27: 8 mg via ORAL
  Filled 2013-02-27: qty 2

## 2013-02-27 MED ORDER — PROPOFOL 10 MG/ML IV BOLUS
INTRAVENOUS | Status: DC | PRN
  Administered 2013-02-27: 200 mg via INTRAVENOUS

## 2013-02-27 MED ORDER — OXYCODONE-ACETAMINOPHEN 5-325 MG PO TABS
2.0000 | ORAL_TABLET | Freq: Once | ORAL | Status: DC
Start: 2013-02-27 — End: 2013-02-28
  Filled 2013-02-27: qty 2

## 2013-02-27 MED ORDER — ONDANSETRON HCL 4 MG PO TABS: 4.0000 mg | ORAL_TABLET | Freq: Four times a day (QID) | ORAL | Status: DC | PRN

## 2013-02-27 MED ORDER — HYDROMORPHONE HCL PF 1 MG/ML IJ SOLN
0.2500 mg | INTRAMUSCULAR | Status: DC | PRN
  Administered 2013-02-27 (×2): 0.5 mg via INTRAVENOUS

## 2013-02-27 MED ORDER — MIDAZOLAM HCL 2 MG/2ML IJ SOLN
INTRAMUSCULAR | Status: AC
  Filled 2013-02-27: qty 2

## 2013-02-27 MED ORDER — PROPOFOL 10 MG/ML IV BOLUS
INTRAVENOUS | Status: AC
  Filled 2013-02-27: qty 20

## 2013-02-27 MED ORDER — METHOCARBAMOL 500 MG PO TABS
500.0000 mg | ORAL_TABLET | Freq: Four times a day (QID) | ORAL | Status: DC | PRN
  Administered 2013-02-27 – 2013-02-28 (×2): 500 mg via ORAL
  Filled 2013-02-27 (×2): qty 1

## 2013-02-27 MED ORDER — MORPHINE SULFATE 4 MG/ML IJ SOLN
4.0000 mg | Freq: Once | INTRAMUSCULAR | Status: AC
  Administered 2013-02-27: 4 mg via INTRAVENOUS
  Filled 2013-02-27: qty 1

## 2013-02-27 MED ORDER — FENTANYL CITRATE 0.05 MG/ML IJ SOLN: 50.0000 ug | Freq: Once | INTRAMUSCULAR | Status: DC

## 2013-02-27 MED ORDER — GLYCOPYRROLATE 0.2 MG/ML IJ SOLN
INTRAMUSCULAR | Status: DC | PRN
  Administered 2013-02-27: 0.3 mg via INTRAVENOUS

## 2013-02-27 MED ORDER — NEOSTIGMINE METHYLSULFATE 1 MG/ML IJ SOLN
INTRAMUSCULAR | Status: DC | PRN
  Administered 2013-02-27: 3 mg via INTRAVENOUS

## 2013-02-27 MED ORDER — ESMOLOL HCL 10 MG/ML IV SOLN
INTRAVENOUS | Status: DC | PRN
  Administered 2013-02-27: 5 mg via INTRAVENOUS
  Administered 2013-02-27 (×2): 10 mg via INTRAVENOUS

## 2013-02-27 MED ORDER — SODIUM CHLORIDE 0.9 % IV SOLN
10.0000 mg | INTRAVENOUS | Status: DC | PRN
  Administered 2013-02-27: 10 ug/min via INTRAVENOUS

## 2013-02-27 MED ORDER — METOCLOPRAMIDE HCL 10 MG PO TABS
5.0000 mg | ORAL_TABLET | Freq: Three times a day (TID) | ORAL | Status: DC | PRN
  Filled 2013-02-27: qty 1

## 2013-02-27 MED ORDER — HYDROMORPHONE HCL PF 1 MG/ML IJ SOLN
0.5000 mg | INTRAMUSCULAR | Status: DC | PRN
  Administered 2013-02-27 – 2013-03-01 (×3): 1 mg via INTRAVENOUS
  Filled 2013-02-27 (×3): qty 1

## 2013-02-27 MED ORDER — DOCUSATE SODIUM 100 MG PO CAPS
100.0000 mg | ORAL_CAPSULE | Freq: Two times a day (BID) | ORAL | Status: DC
  Administered 2013-02-27 – 2013-03-12 (×23): 100 mg via ORAL
  Filled 2013-02-27 (×33): qty 1

## 2013-02-27 MED ORDER — LORAZEPAM 2 MG/ML IJ SOLN
1.0000 mg | Freq: Once | INTRAMUSCULAR | Status: AC
  Administered 2013-02-27: 1 mg via INTRAMUSCULAR
  Filled 2013-02-27: qty 1

## 2013-02-27 MED ORDER — MIDAZOLAM HCL 2 MG/2ML IJ SOLN: 1.0000 mg | INTRAMUSCULAR | Status: DC | PRN

## 2013-02-27 MED ORDER — FENTANYL CITRATE 0.05 MG/ML IJ SOLN
INTRAMUSCULAR | Status: DC | PRN
  Administered 2013-02-27 (×3): 50 ug via INTRAVENOUS
  Administered 2013-02-27 (×2): 100 ug via INTRAVENOUS

## 2013-02-27 MED ORDER — SODIUM CHLORIDE 0.9 % IV BOLUS (SEPSIS)
1000.0000 mL | Freq: Once | INTRAVENOUS | Status: AC
  Administered 2013-02-27: 1000 mL via INTRAVENOUS

## 2013-02-27 MED ORDER — ROCURONIUM BROMIDE 100 MG/10ML IV SOLN
INTRAVENOUS | Status: DC | PRN
  Administered 2013-02-27: 50 mg via INTRAVENOUS

## 2013-02-27 MED ORDER — MORPHINE SULFATE 4 MG/ML IJ SOLN
4.0000 mg | Freq: Once | INTRAMUSCULAR | Status: AC
  Administered 2013-02-27: 4 mg via INTRAMUSCULAR
  Filled 2013-02-27: qty 1

## 2013-02-27 MED ORDER — OXYCODONE HCL 5 MG PO TABS
5.0000 mg | ORAL_TABLET | Freq: Once | ORAL | Status: AC | PRN
  Administered 2013-02-27: 5 mg via ORAL

## 2013-02-27 MED ORDER — CEFAZOLIN SODIUM-DEXTROSE 2-3 GM-% IV SOLR
2.0000 g | Freq: Once | INTRAVENOUS | Status: AC
  Administered 2013-02-27: 2 g via INTRAVENOUS

## 2013-02-27 MED ORDER — CEFAZOLIN SODIUM-DEXTROSE 2-3 GM-% IV SOLR
2.0000 g | Freq: Four times a day (QID) | INTRAVENOUS | Status: AC
  Administered 2013-02-27 – 2013-02-28 (×2): 2 g via INTRAVENOUS
  Filled 2013-02-27 (×2): qty 50

## 2013-02-27 MED ORDER — BUPIVACAINE-EPINEPHRINE PF 0.25-1:200000 % IJ SOLN
INTRAMUSCULAR | Status: DC | PRN
  Administered 2013-02-27: 6 mL

## 2013-02-27 MED ORDER — ROCURONIUM BROMIDE 50 MG/5ML IV SOLN
INTRAVENOUS | Status: AC
  Filled 2013-02-27: qty 1

## 2013-02-27 MED ORDER — OXYCODONE-ACETAMINOPHEN 5-325 MG PO TABS
1.0000 | ORAL_TABLET | ORAL | Status: DC | PRN
  Administered 2013-02-28: 2 via ORAL
  Filled 2013-02-27: qty 2

## 2013-02-27 MED ORDER — GLYCOPYRROLATE 0.2 MG/ML IJ SOLN
INTRAMUSCULAR | Status: AC
  Filled 2013-02-27: qty 3

## 2013-02-27 MED ORDER — METHOCARBAMOL 500 MG PO TABS
ORAL_TABLET | ORAL | Status: AC
  Filled 2013-02-27: qty 1

## 2013-02-27 MED ORDER — LIDOCAINE HCL (CARDIAC) 20 MG/ML IV SOLN
INTRAVENOUS | Status: DC | PRN
  Administered 2013-02-27: 100 mg via INTRAVENOUS

## 2013-02-27 SURGICAL SUPPLY — 48 items
BIT DRILL 3.2X240 A/O LONG (BIT) ×2 IMPLANT
BLADE SURG 10 STRL SS (BLADE) ×2 IMPLANT
BNDG COHESIVE 6X5 TAN STRL LF (GAUZE/BANDAGES/DRESSINGS) ×2 IMPLANT
BNDG CONFORM 2 STRL LF (GAUZE/BANDAGES/DRESSINGS) ×8 IMPLANT
CLAMP PIN-ROD (Clamp) ×12 IMPLANT
CLAMP ROD-ROD (Clamp) ×2 IMPLANT
CLOTH BEACON ORANGE TIMEOUT ST (SAFETY) ×2 IMPLANT
COVER SURGICAL LIGHT HANDLE (MISCELLANEOUS) ×2 IMPLANT
DRAPE C-ARM 42X72 X-RAY (DRAPES) ×2 IMPLANT
DRAPE C-ARMOR (DRAPES) ×2 IMPLANT
DRAPE ORTHO SPLIT 77X108 STRL (DRAPES) ×2
DRAPE SURG ORHT 6 SPLT 77X108 (DRAPES) ×2 IMPLANT
DRAPE U-SHAPE 47X51 STRL (DRAPES) ×2 IMPLANT
DURAPREP 26ML APPLICATOR (WOUND CARE) ×2 IMPLANT
ELECT REM PT RETURN 9FT ADLT (ELECTROSURGICAL)
ELECTRODE REM PT RTRN 9FT ADLT (ELECTROSURGICAL) IMPLANT
GAUZE XEROFORM 5X9 LF (GAUZE/BANDAGES/DRESSINGS) ×2 IMPLANT
GLOVE BIO SURGEON STRL SZ8 (GLOVE) ×2 IMPLANT
GLOVE ORTHO TXT STRL SZ7.5 (GLOVE) ×2 IMPLANT
GLOVE SURG SS PI 8.0 STRL IVOR (GLOVE) ×4 IMPLANT
GOWN PREVENTION PLUS LG XLONG (DISPOSABLE) IMPLANT
GOWN STRL REUS W/ TWL LRG LVL3 (GOWN DISPOSABLE) ×1 IMPLANT
GOWN STRL REUS W/ TWL XL LVL3 (GOWN DISPOSABLE) ×4 IMPLANT
GOWN STRL REUS W/TWL LRG LVL3 (GOWN DISPOSABLE) ×1
GOWN STRL REUS W/TWL XL LVL3 (GOWN DISPOSABLE) ×4
KIT BASIN OR (CUSTOM PROCEDURE TRAY) ×2 IMPLANT
KIT ROOM TURNOVER OR (KITS) ×2 IMPLANT
MANIFOLD NEPTUNE II (INSTRUMENTS) ×2 IMPLANT
NEEDLE 22X1 1/2 (OR ONLY) (NEEDLE) ×2 IMPLANT
NS IRRIG 1000ML POUR BTL (IV SOLUTION) ×2 IMPLANT
PACK ORTHO EXTREMITY (CUSTOM PROCEDURE TRAY) ×2 IMPLANT
PAD ARMBOARD 7.5X6 YLW CONV (MISCELLANEOUS) ×4 IMPLANT
PADDING CAST COTTON 6X4 STRL (CAST SUPPLIES) ×2 IMPLANT
ROD CARBON FIBER 300X9.5MM (Rod) ×4 IMPLANT
ROD CARBON FIBER 9.5X350MM (Rod) ×2 IMPLANT
SCREW BONE 5MMX200 (Screw) ×2 IMPLANT
SCREW BONE SELF DRILL/TAP5X150 (Screw) ×6 IMPLANT
SPONGE GAUZE 4X4 12PLY (GAUZE/BANDAGES/DRESSINGS) ×2 IMPLANT
SPONGE LAP 18X18 X RAY DECT (DISPOSABLE) ×2 IMPLANT
STOCKINETTE IMPERVIOUS LG (DRAPES) ×2 IMPLANT
SUT ETHILON 2 0 FS 18 (SUTURE) IMPLANT
SUT VIC AB 2-0 CT1 27 (SUTURE)
SUT VIC AB 2-0 CT1 TAPERPNT 27 (SUTURE) IMPLANT
SYR CONTROL 10ML LL (SYRINGE) ×2 IMPLANT
TOWEL OR 17X24 6PK STRL BLUE (TOWEL DISPOSABLE) ×2 IMPLANT
TOWEL OR 17X26 10 PK STRL BLUE (TOWEL DISPOSABLE) ×2 IMPLANT
UNDERPAD 30X30 INCONTINENT (UNDERPADS AND DIAPERS) ×2 IMPLANT
WATER STERILE IRR 1000ML POUR (IV SOLUTION) ×2 IMPLANT

## 2013-02-27 NOTE — Interval H&P Note (Signed)
History and Physical Interval Note:  02/27/2013 6:02 PM  [name] [name]  has presented today for surgery, with the diagnosis of Left knee  The various methods of treatment have been discussed with the patient and family. After consideration of risks, benefits and other options for treatment, the patient has consented to  Procedure(s): EXTERNAL FIXATION LEFT KNEE/CLOSE REDUCTION (Left) as a surgical intervention .  The patient's history has been reviewed, patient examined, no change in status, stable for surgery.  I have reviewed the patient's chart and labs.  Questions were answered to the patient's satisfaction.     Britini Garcilazo C
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
## 2013-02-27 NOTE — Anesthesia Postprocedure Evaluation (Signed)
  Anesthesia Post-op Note  Patient: [name] [name]  Procedure(s) Performed: Procedure(s): EXTERNAL FIXATION LEFT KNEE  (Left)  Patient Location: PACU  Anesthesia Type:General  Level of Consciousness: awake  Airway and Oxygen Therapy: Patient Spontanous Breathing  Post-op Pain: mild  Post-op Assessment: Post-op Vital signs reviewed, Patient's Cardiovascular Status Stable, Respiratory Function Stable, Patent Airway, No signs of Nausea or vomiting and Pain level controlled  Post-op Vital Signs: Reviewed and stable  Complications: No apparent anesthesia complications
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
## 2013-02-27 NOTE — Preoperative (Signed)
Beta Blockers   Reason not to administer Beta Blockers:Not Applicable 

## 2013-02-27 NOTE — ED Notes (Addendum)
ORTHO at bed side to place knee immobilizer.
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
## 2013-02-27 NOTE — Brief Op Note (Signed)
02/27/2013  8:07 PM  PATIENT:  Royanne Foots  43 y.o. male  PRE-OPERATIVE DIAGNOSIS:  Left knee fracture  POST-OPERATIVE DIAGNOSIS:  Left knee fracture  PROCEDURE:  Procedure(s): EXTERNAL FIXATION LEFT KNEE  (Left)  SURGEON:  Surgeon(s) and Role:    * Johnn Hai, MD - Primary  PHYSICIAN ASSISTANT:   ASSISTANTS: none   ANESTHESIA:   general  EBL:     BLOOD ADMINISTERED:none  DRAINS: none   LOCAL MEDICATIONS USED:  MARCAINE     SPECIMEN:  No Specimen  DISPOSITION OF SPECIMEN:  N/A  COUNTS:  YES  TOURNIQUET:    DICTATION: .Other Dictation: Dictation Number     [id number]  PLAN OF CARE: Admit to inpatient   PATIENT DISPOSITION:  PACU - hemodynamically stable.   Delay start of Pharmacological VTE agent (>24hrs) due to surgical blood loss or risk of bleeding: no

## 2013-02-27 NOTE — ED Notes (Addendum)
Assisted pt into a gown. Wife at bedside, belongings given to her.
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
## 2013-02-27 NOTE — Anesthesia Preprocedure Evaluation (Addendum)
Anesthesia Evaluation  Patient identified by MRN, date of birth, ID band Patient awake    Reviewed: Allergy & Precautions, H&P , NPO status , Patient's Chart, lab work & pertinent test results  Airway Mallampati: II TM Distance: >3 FB Neck ROM: Full    Dental  (+) Dental Advisory Given   Pulmonary  breath sounds clear to auscultation        Cardiovascular Rhythm:Regular Rate:Normal     Neuro/Psych    GI/Hepatic Crohn's   Endo/Other    Renal/GU      Musculoskeletal   Abdominal (+) + obese,   Peds  Hematology   Anesthesia Other Findings   Reproductive/Obstetrics                          Anesthesia Physical Anesthesia Plan  ASA: II and emergent  Anesthesia Plan: General   Post-op Pain Management:    Induction: Intravenous  Airway Management Planned: Oral ETT  Additional Equipment:   Intra-op Plan:   Post-operative Plan: Extubation in OR  Informed Consent: I have reviewed the patients History and Physical, chart, labs and discussed the procedure including the risks, benefits and alternatives for the proposed anesthesia with the patient or authorized representative who has indicated his/her understanding and acceptance.     Plan Discussed with: CRNA and Surgeon  Anesthesia Plan Comments:         Anesthesia Quick Evaluation

## 2013-02-27 NOTE — Anesthesia Procedure Notes (Signed)
Procedure Name: Intubation Date/Time: 02/27/2013 6:30 PM Performed by: Octavio Graves Pre-anesthesia Checklist: Patient identified, Timeout performed, Emergency Drugs available, Suction available and Patient being monitored Patient Re-evaluated:Patient Re-evaluated prior to inductionOxygen Delivery Method: Circle system utilized Preoxygenation: Pre-oxygenation with 100% oxygen Intubation Type: IV induction Ventilation: Mask ventilation without difficulty Laryngoscope Size: Miller and 2 Grade View: Grade I Tube type: Oral Tube size: 7.5 mm Number of attempts: 1 Airway Equipment and Method: Stylet Placement Confirmation: ETT inserted through vocal cords under direct vision,  breath sounds checked- equal and bilateral and positive ETCO2 Secured at: 22 cm Tube secured with: Tape Dental Injury: Teeth and Oropharynx as per pre-operative assessment  Comments: IV induction Kasik- intubation AM CRNA atraumatic - teeth and mouth as preop

## 2013-02-27 NOTE — ED Notes (Signed)
Gilman CSI agent with pt at this time

## 2013-02-27 NOTE — Progress Notes (Signed)
Orthopedic Tech Progress Note Patient Details:  [name] [name] 29-Nov-1970 [id number]  Ortho Devices Type of Ortho Device: Knee Immobilizer Ortho Device/Splint Location: lle Ortho Device/Splint Interventions: Application   [name] 02/27/2013, 5:12 PM

## 2013-02-27 NOTE — Transfer of Care (Signed)
Immediate Anesthesia Transfer of Care Note  Patient: [name] [name]  Procedure(s) Performed: Procedure(s): EXTERNAL FIXATION LEFT KNEE  (Left)  Patient Location: PACU  Anesthesia Type:General  Level of Consciousness: awake and alert   Airway & Oxygen Therapy: Patient Spontanous Breathing and Patient connected to face mask oxygen  Post-op Assessment: Report given to PACU RN and Post -op Vital signs reviewed and stable  Post vital signs: Reviewed and stable  Complications: No apparent anesthesia complications

## 2013-02-28 ENCOUNTER — Encounter (HOSPITAL_COMMUNITY): Payer: Self-pay | Admitting: Specialist

## 2013-02-28 ENCOUNTER — Inpatient Hospital Stay (HOSPITAL_COMMUNITY): Payer: Worker's Compensation

## 2013-02-28 LAB — COMPREHENSIVE METABOLIC PANEL
ALT: 28 U/L (ref 0–53)
AST: 23 U/L (ref 0–37)
Albumin: 3.4 g/dL — ABNORMAL LOW (ref 3.5–5.2)
Alkaline Phosphatase: 62 U/L (ref 39–117)
BUN: 9 mg/dL (ref 6–23)
CALCIUM: 8.7 mg/dL (ref 8.4–10.5)
CHLORIDE: 102 meq/L (ref 96–112)
CO2: 25 meq/L (ref 19–32)
Creatinine, Ser: 1.26 mg/dL (ref 0.50–1.35)
GFR calc Af Amer: 80 mL/min — ABNORMAL LOW (ref >90)
GFR calc non Af Amer: 69 mL/min — ABNORMAL LOW (ref >90)
Glucose, Bld: 130 mg/dL — ABNORMAL HIGH (ref 70–99)
POTASSIUM: 3.8 meq/L (ref 3.7–5.3)
SODIUM: 141 meq/L (ref 137–147)
Total Bilirubin: 1.1 mg/dL (ref 0.3–1.2)
Total Protein: 6.9 g/dL (ref 6.0–8.3)

## 2013-02-28 MED ORDER — LACTATED RINGERS IV SOLN
INTRAVENOUS | Status: DC
  Administered 2013-02-28 – 2013-03-01 (×3): via INTRAVENOUS
  Administered 2013-03-02: 75 mL/h via INTRAVENOUS
  Administered 2013-03-02 – 2013-03-03 (×2): via INTRAVENOUS

## 2013-02-28 MED ORDER — METHOCARBAMOL 100 MG/ML IJ SOLN
500.0000 mg | Freq: Four times a day (QID) | INTRAVENOUS | Status: DC | PRN
  Filled 2013-02-28: qty 5

## 2013-02-28 MED ORDER — OXYCODONE HCL 5 MG PO TABS
5.0000 mg | ORAL_TABLET | ORAL | Status: DC | PRN
  Administered 2013-02-28: 15 mg via ORAL
  Administered 2013-02-28 (×2): 10 mg via ORAL
  Administered 2013-03-01 (×2): 15 mg via ORAL
  Filled 2013-02-28: qty 2
  Filled 2013-02-28 (×2): qty 3
  Filled 2013-02-28: qty 2
  Filled 2013-02-28: qty 3

## 2013-02-28 MED ORDER — INFLUENZA VAC SPLIT QUAD 0.5 ML IM SUSP
0.5000 mL | INTRAMUSCULAR | Status: AC
  Administered 2013-03-02: 0.5 mL via INTRAMUSCULAR
  Filled 2013-02-28 (×2): qty 0.5

## 2013-02-28 MED ORDER — METHOCARBAMOL 500 MG PO TABS
500.0000 mg | ORAL_TABLET | Freq: Four times a day (QID) | ORAL | Status: DC | PRN
  Administered 2013-02-28 – 2013-03-03 (×3): 500 mg via ORAL
  Administered 2013-03-03 – 2013-03-06 (×3): 1000 mg via ORAL
  Administered 2013-03-08: 500 mg via ORAL
  Filled 2013-02-28 (×2): qty 1
  Filled 2013-02-28 (×4): qty 2
  Filled 2013-02-28: qty 1
  Filled 2013-02-28: qty 2
  Filled 2013-02-28 (×2): qty 1

## 2013-02-28 MED ORDER — METHOCARBAMOL 100 MG/ML IJ SOLN
1000.0000 mg | Freq: Four times a day (QID) | INTRAVENOUS | Status: DC | PRN
  Filled 2013-02-28: qty 10

## 2013-02-28 MED ORDER — ACETAMINOPHEN 325 MG PO TABS: 325.0000 mg | ORAL_TABLET | Freq: Four times a day (QID) | ORAL | Status: DC | PRN

## 2013-02-28 MED ORDER — OXYCODONE-ACETAMINOPHEN 5-325 MG PO TABS
1.0000 | ORAL_TABLET | Freq: Four times a day (QID) | ORAL | Status: DC | PRN
  Administered 2013-02-28 – 2013-03-12 (×16): 2 via ORAL
  Filled 2013-02-28 (×17): qty 2

## 2013-02-28 NOTE — Care Management Utilization Note (Signed)
UR review completed. Ricki Miller, RN BSN Case Manager
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
## 2013-02-28 NOTE — ED Provider Notes (Signed)
Medical screening examination/treatment/procedure(s) were performed by non-physician practitioner and as supervising physician I was immediately available for consultation/collaboration.      Merryl Hacker, MD 02/28/13 [phone]

## 2013-02-28 NOTE — Care Management Note (Signed)
CARE MANAGEMENT NOTE 02/28/2013  Patient:  [name] [name], [name] [name]   Account Number:  [id number]  Date Initiated:  02/28/2013  Documentation initiated by:  Ricki Miller  Subjective/Objective Assessment:   43 yr old male s/p closed reduction with external fixator placement.     Action/Plan:   CM spoke with patient and wife. He is under workers comp. CM will contact for [facility] needs. Will follow   Anticipated DC Date:     Anticipated DC Plan:  [place]  CM consult      Choice offered to / List presented to:             Status of service:  In process, will continue to follow Medicare Important Message given?   (If response is "NO", the following Medicare IM given date fields will be blank) Date Medicare IM given:

## 2013-03-01 ENCOUNTER — Inpatient Hospital Stay (HOSPITAL_COMMUNITY): Payer: Worker's Compensation

## 2013-03-01 ENCOUNTER — Encounter (HOSPITAL_COMMUNITY)
Admission: EM | Disposition: A | Discharge status: Skilled Nursing Facility | Payer: Self-pay | Source: Home / Self Care | Attending: Orthopedic Surgery

## 2013-03-01 ENCOUNTER — Encounter (HOSPITAL_COMMUNITY): Payer: Self-pay | Admitting: Certified Registered"

## 2013-03-01 ENCOUNTER — Encounter (HOSPITAL_COMMUNITY): Payer: Worker's Compensation | Admitting: Certified Registered"

## 2013-03-01 ENCOUNTER — Inpatient Hospital Stay (HOSPITAL_COMMUNITY): Payer: Worker's Compensation | Admitting: Certified Registered"

## 2013-03-01 DIAGNOSIS — S82209A Unspecified fracture of shaft of unspecified tibia, initial encounter for closed fracture: Secondary | ICD-10-CM

## 2013-03-01 DIAGNOSIS — R0902 Hypoxemia: Secondary | ICD-10-CM

## 2013-03-01 DIAGNOSIS — R000 Tachycardia, unspecified: Secondary | ICD-10-CM

## 2013-03-01 HISTORY — PX: EXTERNAL FIXATION LEG: SHX1549

## 2013-03-01 LAB — PREALBUMIN: Prealbumin: 17.9 mg/dL — ABNORMAL LOW (ref 17.0–34.0)

## 2013-03-01 LAB — BASIC METABOLIC PANEL
BUN: 6 mg/dL (ref 6–23)
CO2: 30 mEq/L (ref 19–32)
CREATININE: 1.2 mg/dL (ref 0.50–1.35)
Calcium: 9.2 mg/dL (ref 8.4–10.5)
Chloride: 99 mEq/L (ref 96–112)
GFR calc Af Amer: 85 mL/min — ABNORMAL LOW (ref >90)
GFR calc non Af Amer: 74 mL/min — ABNORMAL LOW (ref >90)
GLUCOSE: 124 mg/dL — AB (ref 70–99)
Potassium: 3.9 mEq/L (ref 3.7–5.3)
Sodium: 139 mEq/L (ref 137–147)

## 2013-03-01 LAB — CBC
HCT: 37.3 % — ABNORMAL LOW (ref 39.0–52.0)
Hemoglobin: 12.6 g/dL — ABNORMAL LOW (ref 13.0–17.0)
MCH: 27.1 pg (ref 26.0–34.0)
MCHC: 33.8 g/dL (ref 30.0–36.0)
MCV: 80.2 fL (ref 78.0–100.0)
Platelets: 267 10*3/uL (ref 150–400)
RBC: 4.65 MIL/uL (ref 4.22–5.81)
RDW: 14.4 % (ref 11.5–15.5)
WBC: 7.7 10*3/uL (ref 4.0–10.5)

## 2013-03-01 LAB — VITAMIN D 25 HYDROXY (VIT D DEFICIENCY, FRACTURES): Vit D, 25-Hydroxy: 14 ng/mL — ABNORMAL LOW (ref 30–89)

## 2013-03-01 SURGERY — EXTERNAL FIXATION, LOWER EXTREMITY
Anesthesia: General | Site: Leg Lower | Laterality: Left

## 2013-03-01 MED ORDER — PROPOFOL 10 MG/ML IV BOLUS
INTRAVENOUS | Status: DC | PRN
  Administered 2013-03-01: 200 mg via INTRAVENOUS

## 2013-03-01 MED ORDER — OXYCODONE HCL 5 MG PO TABS
5.0000 mg | ORAL_TABLET | ORAL | Status: DC | PRN
  Administered 2013-03-03 – 2013-03-09 (×9): 10 mg via ORAL
  Administered 2013-03-12: 5 mg via ORAL
  Filled 2013-03-01 (×8): qty 2
  Filled 2013-03-01: qty 1
  Filled 2013-03-01 (×2): qty 2

## 2013-03-01 MED ORDER — IPRATROPIUM-ALBUTEROL 0.5-2.5 (3) MG/3ML IN SOLN
3.0000 mL | Freq: Four times a day (QID) | RESPIRATORY_TRACT | Status: DC
  Filled 2013-03-01: qty 3

## 2013-03-01 MED ORDER — FENTANYL CITRATE 0.05 MG/ML IJ SOLN
INTRAMUSCULAR | Status: DC | PRN
  Administered 2013-03-01: 100 ug via INTRAVENOUS

## 2013-03-01 MED ORDER — IOHEXOL 350 MG/ML SOLN
100.0000 mL | Freq: Once | INTRAVENOUS | Status: AC | PRN
  Administered 2013-03-01: 100 mL via INTRAVENOUS

## 2013-03-01 MED ORDER — MEPERIDINE HCL 25 MG/ML IJ SOLN: 6.2500 mg | INTRAMUSCULAR | Status: DC | PRN

## 2013-03-01 MED ORDER — ONDANSETRON HCL 4 MG/2ML IJ SOLN: 4.0000 mg | Freq: Once | INTRAMUSCULAR | Status: DC | PRN

## 2013-03-01 MED ORDER — MIDAZOLAM HCL 2 MG/2ML IJ SOLN
INTRAMUSCULAR | Status: AC
  Filled 2013-03-01: qty 2

## 2013-03-01 MED ORDER — 0.9 % SODIUM CHLORIDE (POUR BTL) OPTIME
TOPICAL | Status: DC | PRN
  Administered 2013-03-01: 1000 mL

## 2013-03-01 MED ORDER — LIDOCAINE HCL (CARDIAC) 20 MG/ML IV SOLN
INTRAVENOUS | Status: DC | PRN
  Administered 2013-03-01: 100 mg via INTRAVENOUS

## 2013-03-01 MED ORDER — OXYCODONE HCL 5 MG PO TABS
5.0000 mg | ORAL_TABLET | Freq: Once | ORAL | Status: AC | PRN
  Administered 2013-03-01: 5 mg via ORAL

## 2013-03-01 MED ORDER — HYDROMORPHONE HCL PF 1 MG/ML IJ SOLN
0.5000 mg | INTRAMUSCULAR | Status: DC | PRN
  Administered 2013-03-02 – 2013-03-09 (×4): 1 mg via INTRAVENOUS
  Filled 2013-03-01 (×4): qty 1

## 2013-03-01 MED ORDER — FENTANYL CITRATE 0.05 MG/ML IJ SOLN
INTRAMUSCULAR | Status: AC
  Filled 2013-03-01: qty 5

## 2013-03-01 MED ORDER — IPRATROPIUM-ALBUTEROL 0.5-2.5 (3) MG/3ML IN SOLN: 3.0000 mL | RESPIRATORY_TRACT | Status: DC | PRN

## 2013-03-01 MED ORDER — METOPROLOL TARTRATE 1 MG/ML IV SOLN
INTRAVENOUS | Status: AC
Start: 2013-03-01 — End: 2013-03-02
  Filled 2013-03-01: qty 5

## 2013-03-01 MED ORDER — NALOXONE HCL 0.4 MG/ML IJ SOLN
INTRAMUSCULAR | Status: DC | PRN
  Administered 2013-03-01: 0.2 mg via INTRAVENOUS

## 2013-03-01 MED ORDER — ONDANSETRON HCL 4 MG/2ML IJ SOLN
INTRAMUSCULAR | Status: DC | PRN
  Administered 2013-03-01: 4 mg via INTRAVENOUS

## 2013-03-01 MED ORDER — ONDANSETRON HCL 4 MG/2ML IJ SOLN
INTRAMUSCULAR | Status: AC
  Filled 2013-03-01: qty 2

## 2013-03-01 MED ORDER — OXYCODONE HCL 5 MG PO TABS
ORAL_TABLET | ORAL | Status: AC
  Filled 2013-03-01: qty 1

## 2013-03-01 MED ORDER — HYDROMORPHONE HCL PF 1 MG/ML IJ SOLN
INTRAMUSCULAR | Status: AC
  Filled 2013-03-01: qty 2

## 2013-03-01 MED ORDER — LIDOCAINE HCL (CARDIAC) 20 MG/ML IV SOLN
INTRAVENOUS | Status: AC
  Filled 2013-03-01: qty 5

## 2013-03-01 MED ORDER — LACTATED RINGERS IV SOLN
INTRAVENOUS | Status: DC
  Administered 2013-03-01: 10:00:00 via INTRAVENOUS

## 2013-03-01 MED ORDER — OXYCODONE HCL 5 MG/5ML PO SOLN: 5.0000 mg | Freq: Once | ORAL | Status: AC | PRN

## 2013-03-01 MED ORDER — SUCCINYLCHOLINE CHLORIDE 20 MG/ML IJ SOLN
INTRAMUSCULAR | Status: DC | PRN
  Administered 2013-03-01: 100 mg via INTRAVENOUS

## 2013-03-01 MED ORDER — HYDROMORPHONE HCL PF 1 MG/ML IJ SOLN
0.2500 mg | INTRAMUSCULAR | Status: DC | PRN
  Administered 2013-03-01 (×2): 0.5 mg via INTRAVENOUS

## 2013-03-01 MED ORDER — METOPROLOL TARTRATE 1 MG/ML IV SOLN
5.0000 mg | INTRAVENOUS | Status: DC | PRN
  Administered 2013-03-01 – 2013-03-09 (×2): 5 mg via INTRAVENOUS
  Filled 2013-03-01 (×2): qty 5

## 2013-03-01 SURGICAL SUPPLY — 81 items
BANDAGE ELASTIC 4 VELCRO ST LF (GAUZE/BANDAGES/DRESSINGS) IMPLANT
BANDAGE ELASTIC 6 VELCRO ST LF (GAUZE/BANDAGES/DRESSINGS) ×3 IMPLANT
BANDAGE ESMARK 6X9 LF (GAUZE/BANDAGES/DRESSINGS) ×1 IMPLANT
BANDAGE GAUZE ELAST BULKY 4 IN (GAUZE/BANDAGES/DRESSINGS) IMPLANT
BAR GLASS FIBER EXFX 11X150 (EXFIX) ×3 IMPLANT
BAR GLASS FIBER EXFX 11X500 (MISCELLANEOUS) ×6 IMPLANT
BNDG COHESIVE 4X5 WHT NS (GAUZE/BANDAGES/DRESSINGS) ×6 IMPLANT
BNDG COHESIVE 6X5 TAN STRL LF (GAUZE/BANDAGES/DRESSINGS) ×3 IMPLANT
BNDG ESMARK 6X9 LF (GAUZE/BANDAGES/DRESSINGS) ×3
BNDG GAUZE ELAST 4 BULKY (GAUZE/BANDAGES/DRESSINGS) ×3 IMPLANT
BRUSH SCRUB DISP (MISCELLANEOUS) IMPLANT
CLAMP BLUE BAR TO BAR (MISCELLANEOUS) ×6 IMPLANT
CLAMP BLUE BAR TO PIN (MISCELLANEOUS) ×6 IMPLANT
CLEANER TIP ELECTROSURG 2X2 (MISCELLANEOUS) IMPLANT
CLOSURE WOUND 1/2 X4 (GAUZE/BANDAGES/DRESSINGS)
CLOTH BEACON ORANGE TIMEOUT ST (SAFETY) IMPLANT
COVER SURGICAL LIGHT HANDLE (MISCELLANEOUS) ×3 IMPLANT
CUFF TOURNIQUET SINGLE 18IN (TOURNIQUET CUFF) IMPLANT
CUFF TOURNIQUET SINGLE 24IN (TOURNIQUET CUFF) IMPLANT
CUFF TOURNIQUET SINGLE 34IN LL (TOURNIQUET CUFF) IMPLANT
DRAPE C-ARM 42X72 X-RAY (DRAPES) IMPLANT
DRAPE C-ARMOR (DRAPES) ×3 IMPLANT
DRAPE ORTHO SPLIT 77X108 STRL (DRAPES) ×4
DRAPE SURG ORHT 6 SPLT 77X108 (DRAPES) ×2 IMPLANT
DRAPE U-SHAPE 47X51 STRL (DRAPES) ×3 IMPLANT
DRSG ADAPTIC 3X8 NADH LF (GAUZE/BANDAGES/DRESSINGS) IMPLANT
DRSG MEPILEX BORDER 4X4 (GAUZE/BANDAGES/DRESSINGS) ×6 IMPLANT
ELECT REM PT RETURN 9FT ADLT (ELECTROSURGICAL) ×3
ELECTRODE REM PT RTRN 9FT ADLT (ELECTROSURGICAL) ×1 IMPLANT
EVACUATOR 1/8 PVC DRAIN (DRAIN) IMPLANT
GLOVE BIO SURGEON STRL SZ7.5 (GLOVE) ×6 IMPLANT
GLOVE BIO SURGEON STRL SZ8 (GLOVE) ×3 IMPLANT
GLOVE BIOGEL PI IND STRL 7.0 (GLOVE) ×2 IMPLANT
GLOVE BIOGEL PI IND STRL 7.5 (GLOVE) ×1 IMPLANT
GLOVE BIOGEL PI IND STRL 8 (GLOVE) ×1 IMPLANT
GLOVE BIOGEL PI INDICATOR 7.0 (GLOVE) ×4
GLOVE BIOGEL PI INDICATOR 7.5 (GLOVE) ×2
GLOVE BIOGEL PI INDICATOR 8 (GLOVE) ×2
GLOVE SURG SS PI 6.5 STRL IVOR (GLOVE) ×3 IMPLANT
GLOVE SURG SS PI 7.0 STRL IVOR (GLOVE) ×3 IMPLANT
GOWN STRL REUS W/ TWL LRG LVL3 (GOWN DISPOSABLE) ×2 IMPLANT
GOWN STRL REUS W/ TWL XL LVL3 (GOWN DISPOSABLE) ×2 IMPLANT
GOWN STRL REUS W/TWL LRG LVL3 (GOWN DISPOSABLE) ×6
GOWN STRL REUS W/TWL XL LVL3 (GOWN DISPOSABLE) ×4
HALF PIN 5.0X160 (PIN) ×3 IMPLANT
HANDPIECE INTERPULSE COAX TIP (DISPOSABLE)
KIT BASIN OR (CUSTOM PROCEDURE TRAY) ×3 IMPLANT
KIT ROOM TURNOVER OR (KITS) ×3 IMPLANT
MANIFOLD NEPTUNE II (INSTRUMENTS) IMPLANT
NEEDLE 22X1 1/2 (OR ONLY) (NEEDLE) IMPLANT
NS IRRIG 1000ML POUR BTL (IV SOLUTION) ×3 IMPLANT
PACK ORTHO EXTREMITY (CUSTOM PROCEDURE TRAY) ×3 IMPLANT
PAD ARMBOARD 7.5X6 YLW CONV (MISCELLANEOUS) ×6 IMPLANT
PADDING CAST COTTON 6X4 STRL (CAST SUPPLIES) IMPLANT
PIN CLAMP 2BAR 75MM BLUE (PIN) ×3 IMPLANT
PIN HALF ORANGE 5X200X45MM (Pin) ×6 IMPLANT
PIN HALF YELLOW 5X160X35 (PIN) ×3 IMPLANT
SCRUB BETADINE 4OZ XXX (MISCELLANEOUS) ×3 IMPLANT
SET HNDPC FAN SPRY TIP SCT (DISPOSABLE) IMPLANT
SOLUTION BETADINE 4OZ (MISCELLANEOUS) ×3 IMPLANT
SPONGE GAUZE 4X4 12PLY (GAUZE/BANDAGES/DRESSINGS) IMPLANT
SPONGE LAP 18X18 X RAY DECT (DISPOSABLE) ×3 IMPLANT
SPONGE SCRUB IODOPHOR (GAUZE/BANDAGES/DRESSINGS) IMPLANT
SPRAY BETADINE AEROSOL 3OZ (MISCELLANEOUS) ×3 IMPLANT
STAPLER VISISTAT 35W (STAPLE) IMPLANT
STOCKINETTE IMPERVIOUS LG (DRAPES) ×3 IMPLANT
STRIP CLOSURE SKIN 1/2X4 (GAUZE/BANDAGES/DRESSINGS) IMPLANT
SUCTION FRAZIER TIP 10 FR DISP (SUCTIONS) IMPLANT
SUT ETHILON 3 0 PS 1 (SUTURE) ×3 IMPLANT
SUT VIC AB 0 CT1 27 (SUTURE)
SUT VIC AB 0 CT1 27XBRD ANBCTR (SUTURE) IMPLANT
SUT VIC AB 2-0 CT1 27 (SUTURE)
SUT VIC AB 2-0 CT1 TAPERPNT 27 (SUTURE) IMPLANT
SYR CONTROL 10ML LL (SYRINGE) IMPLANT
TOWEL OR 17X24 6PK STRL BLUE (TOWEL DISPOSABLE) ×3 IMPLANT
TOWEL OR 17X26 10 PK STRL BLUE (TOWEL DISPOSABLE) ×3 IMPLANT
TUBE CONNECTING 12'X1/4 (SUCTIONS)
TUBE CONNECTING 12X1/4 (SUCTIONS) IMPLANT
UNDERPAD 30X30 INCONTINENT (UNDERPADS AND DIAPERS) ×3 IMPLANT
WATER STERILE IRR 1000ML POUR (IV SOLUTION) IMPLANT
YANKAUER SUCT BULB TIP NO VENT (SUCTIONS) IMPLANT

## 2013-03-01 NOTE — Anesthesia Procedure Notes (Signed)
Procedure Name: LMA Insertion Date/Time: 03/01/2013 10:46 AM Performed by: Manuela Schwartz B Pre-anesthesia Checklist: Patient identified, Emergency Drugs available, Suction available, Patient being monitored and Timeout performed Patient Re-evaluated:Patient Re-evaluated prior to inductionOxygen Delivery Method: Circle system utilized Preoxygenation: Pre-oxygenation with 100% oxygen Intubation Type: IV induction LMA: LMA inserted LMA Size: 5.0 Number of attempts: 1 Placement Confirmation: positive ETCO2 and breath sounds checked- equal and bilateral Tube secured with: Tape Dental Injury: Teeth and Oropharynx as per pre-operative assessment

## 2013-03-01 NOTE — Consult Note (Signed)
Patient examined and I agree with the assessment and plan Fat emboli vs PNA or aspiration. Will F/U chest CT and abd x-ray. May also have an early ileus. Beta blocker for tachycardia.  Georganna Skeans, MD, MPH, FACS Trauma: [phone] General Surgery: [phone]  03/01/2013 4:48 PM

## 2013-03-01 NOTE — Progress Notes (Signed)
Orthopedic Tech Progress Note Patient Details:  [name] [name] 1970-05-14 [id number]  Ortho Devices Type of Ortho Device: Postop shoe/boot Ortho Device/Splint Location: lle Ortho Device/Splint Interventions: Application   Cammer, Theodoro Parma 03/01/2013, 3:39 PM

## 2013-03-01 NOTE — Anesthesia Preprocedure Evaluation (Signed)
Anesthesia Evaluation  Patient identified by MRN, date of birth, ID band Patient awake    Reviewed: Allergy & Precautions, H&P , NPO status , Patient's Chart, lab work & pertinent test results  Airway Mallampati: I TM Distance: >3 FB Neck ROM: Full    Dental   Pulmonary          Cardiovascular     Neuro/Psych    GI/Hepatic   Endo/Other    Renal/GU      Musculoskeletal   Abdominal   Peds  Hematology   Anesthesia Other Findings   Reproductive/Obstetrics                           Anesthesia Physical Anesthesia Plan  ASA: II  Anesthesia Plan: General   Post-op Pain Management:    Induction: Intravenous  Airway Management Planned: LMA  Additional Equipment:   Intra-op Plan:   Post-operative Plan: Extubation in OR  Informed Consent: I have reviewed the patients History and Physical, chart, labs and discussed the procedure including the risks, benefits and alternatives for the proposed anesthesia with the patient or authorized representative who has indicated his/her understanding and acceptance.     Plan Discussed with: CRNA and Surgeon  Anesthesia Plan Comments:         Anesthesia Quick Evaluation

## 2013-03-01 NOTE — Brief Op Note (Signed)
02/27/2013 - 03/01/2013  12:57 PM  PATIENT:  [name] [name]  43 y.o. male  PRE-OPERATIVE DIAGNOSIS:  LEFT LEG FRACTURE, bicondylar plateau  POST-OPERATIVE DIAGNOSIS:  LEFT LEG FRACTURE, bicondylar plateau  PROCEDURE:  Procedure(s): REVISION OF EXTERNAL FIXATOR LEFT LEG (Left) Revision of closed reduction bicondylar plateau  SURGEON:  Surgeon(s) and Role:    * Rozanna Box, MD - Primary  PHYSICIAN ASSISTANT: Ainsley Spinner, PA-C  ANESTHESIA:   general  I/O:     SPECIMEN:  No Specimen  TOURNIQUET:  * No tourniquets in log *  DICTATION: .Other Dictation: Dictation Number [id number]

## 2013-03-01 NOTE — Transfer of Care (Signed)
Immediate Anesthesia Transfer of Care Note  Patient: [name] [name]  Procedure(s) Performed: Procedure(s): REVISION OF EXTERNAL FIXATOR LEFT LEG (Left)  Patient Location: PACU  Anesthesia Type:General  Level of Consciousness: awake and alert   Airway & Oxygen Therapy: Patient Spontanous Breathing and Patient connected to nasal cannula oxygen  Post-op Assessment: Report given to PACU RN and Post -op Vital signs reviewed and stable  Post vital signs: Reviewed and stable  Complications: No apparent anesthesia complications
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
## 2013-03-01 NOTE — Progress Notes (Signed)
PT Cancellation Note  Patient Details Name: [name] [name] MRN: [id number] DOB: 06/17/1970   Cancelled Treatment:    Reason Eval/Treat Not Completed:  (Pt in OR). PT to return as appropraite.   Kingsley Callander 03/01/2013, 11:01 AM

## 2013-03-02 ENCOUNTER — Inpatient Hospital Stay (HOSPITAL_COMMUNITY): Payer: Worker's Compensation

## 2013-03-02 MED ORDER — METOPROLOL TARTRATE 25 MG PO TABS: 25.0000 mg | ORAL_TABLET | Freq: Two times a day (BID) | ORAL | Status: DC

## 2013-03-02 MED ORDER — OXYCODONE-ACETAMINOPHEN 5-325 MG PO TABS: 1.0000 | ORAL_TABLET | Freq: Four times a day (QID) | ORAL | Status: DC | PRN

## 2013-03-02 MED ORDER — POLYETHYLENE GLYCOL 3350 17 G PO PACK
17.0000 g | PACK | Freq: Every day | ORAL | Status: DC
  Administered 2013-03-02 – 2013-03-10 (×4): 17 g via ORAL
  Filled 2013-03-02 (×12): qty 1

## 2013-03-02 MED ORDER — VITAMIN D (ERGOCALCIFEROL) 1.25 MG (50000 UNIT) PO CAPS: 50000.0000 [IU] | ORAL_CAPSULE | ORAL | Status: DC

## 2013-03-02 MED ORDER — OXYCODONE HCL 5 MG PO TABS: 5.0000 mg | ORAL_TABLET | ORAL | Status: DC | PRN

## 2013-03-02 MED ORDER — ENOXAPARIN SODIUM 40 MG/0.4ML ~~LOC~~ SOLN: 40.0000 mg | SUBCUTANEOUS | Status: DC

## 2013-03-02 MED ORDER — VITAMIN D (ERGOCALCIFEROL) 1.25 MG (50000 UNIT) PO CAPS
50000.0000 [IU] | ORAL_CAPSULE | ORAL | Status: DC
  Administered 2013-03-02 – 2013-03-09 (×2): 50000 [IU] via ORAL
  Filled 2013-03-02 (×2): qty 1

## 2013-03-02 MED ORDER — METHOCARBAMOL 500 MG PO TABS: 500.0000 mg | ORAL_TABLET | Freq: Four times a day (QID) | ORAL | Status: DC | PRN

## 2013-03-02 NOTE — Progress Notes (Signed)
Trauma Service Note  Subjective: Patient feeling better this AM.  No acute distress.  Objective: Vital signs in last 24 hours: Temp:  [97.5 F (36.4 C)-99.7 F (37.6 C)] 98.9 F (37.2 C) (02/27 0749) Pulse Rate:  [110-147] 113 (02/27 0700) Resp:  [14-30] 23 (02/27 0700) BP: (117-151)/(70-106) 134/75 mmHg (02/27 0700) SpO2:  [85 %-100 %] 99 % (02/27 0700) Last BM Date: 02/26/13  Intake/Output from previous day: 02/26 0701 - 02/27 0700 In: 1395 [P.O.:120; I.V.:1275] Out: 1350 [Urine:1350] Intake/Output this shift:    General: No acute distress  Lungs: Clear to auscultation.  No CXR yest today.  Abd: Benign, slightly distended, but good bowel sounds.  Extremities: No changes  Neuro: Intact  Lab Results: CBC   Recent Labs  02/27/13 1603 03/01/13 0507  WBC 13.9* 7.7  HGB 14.5 12.6*  HCT 41.5 37.3*  PLT 276 267   BMET  Recent Labs  02/28/13 0456 03/01/13 0507  NA 141 139  K 3.8 3.9  CL 102 99  CO2 25 30  GLUCOSE 130* 124*  BUN 9 6  CREATININE 1.26 1.20  CALCIUM 8.7 9.2   PT/INR No results found for this basename: LABPROT, INR,  in the last 72 hours ABG No results found for this basename: PHART, PCO2, PO2, HCO3,  in the last 72 hours  Studies/Results: Dg Knee 1-2 Views Left  02/28/2013   CLINICAL DATA:  post ex fix for tibial plateau fracture  EXAM: LEFT KNEE - 1-2 VIEW  COMPARISON:  DG KNEE 1-2 VIEWS*L* dated 02/27/2013; CT KNEE*L* W/O CM dated 02/27/2013; DG HIP COMPLETE*L* dated 02/27/2013; DG KNEE COMPLETE 4 VIEWS*L* dated 02/27/2013  FINDINGS: Patient status post external fixator placement within the femur and tibia secondary to a comminuted tibial plateau fracture. A the hardware appears intact without evidence of loosening or failure. Joint effusion is identified. There has been interval reduction of the displacement, angulation and distraction of the comminuted tibial plateau fracture when comparing to the previous study.  IMPRESSION: Patient status  post external fixator placement for a tibial plateau fracture.   Electronically Signed   By: Margaree Mackintosh M.D.   On: 02/28/2013 11:29   Dg Abd 1 View  03/01/2013   CLINICAL DATA:  Abdominal distention with nausea and low-grade fever  EXAM: ABDOMEN - 1 VIEW  COMPARISON:  None.  FINDINGS: The bowel gas pattern is nonspecific. There is a moderate amount of gas within small-bowel loops as well as a moderate amount of colonic gas. A small amount of gas is present within the rectum. There is stool in the right colon. No definite free extraluminal gas is demonstrated. There is contrast within the renal collecting systems and urinary bladder from the previous chest CT scan.  IMPRESSION: The bowel gas pattern is nonspecific and may reflect a generalized ileus. A moderate amount of gas is present in both small and large bowel loops. Nasogastric suction may be useful.   Electronically Signed   By: David  Martin[place]   On: 03/01/2013 17:19   Ct Angio Chest Pe W/cm &/or Wo Cm  03/01/2013   CLINICAL DATA:  Sudden onset of dyspnea postoperatively.  EXAM: CT ANGIOGRAPHY CHEST WITH CONTRAST  TECHNIQUE: Multidetector CT imaging of the chest was performed using the standard protocol during bolus administration of intravenous contrast. Multiplanar CT image reconstructions and MIPs were obtained to evaluate the vascular anatomy.  CONTRAST:  155m OMNIPAQUE IOHEXOL 350 MG/ML SOLN  COMPARISON:  DG CHEST 1V PORT dated 03/01/2013  FINDINGS: There  are bilateral confluent alveolar densities. These involve the right upper and lower lobes and the left lower lobe. The cardiac chambers are top-normal in size. Contrast within the pulmonary arterial tree is normal in appearance. There are no filling defects to suggest an acute pulmonary embolism. The caliber of the thoracic aorta is normal. There is thickening of the wall of the distal esophagus which may reflect esophagitis. A small hiatal hernia is suspected. No bulky mediastinal or hilar  lymphadenopathy is demonstrated. There is no pleural or pericardial effusion.  Within the upper abdomen the stomach contains a moderate amount of fluid. The observed portions of the liver and spleen appear normal.  The thoracic vertebral bodies and sternum appear normal where visualized. No acute abnormalities of the visualized portions of the ribs are demonstrated.  Review of the MIP images confirms the above findings.  IMPRESSION: 1. Bilateral alveolar infiltrates are consistent with acute aspiration pneumonia. 2. There is no evidence of an acute pulmonary embolism. 3. There is no evidence of CHF. There is no pleural nor pericardial effusion. 4. The wall of the distal esophagus is thickened and there may be a small hiatal hernia. There is a moderate amount of fluid within the stomach. 5. These results were called by telephone at the time of interpretation on 03/01/2013 at 4:57 PM to Kasandra Knudsen, RN,, who verbally acknowledged these results.   Electronically Signed   By: David  [place]   On: 03/01/2013 16:58   Dg Chest Port 1 View  03/01/2013   CLINICAL DATA:  Tachycardia.  Aspiration.  EXAM: PORTABLE CHEST - 1 VIEW  COMPARISON:  None.  FINDINGS: Multifocal airspace disease is present in the right upper lobe, right base and left infrahilar region. This may represent asymmetric pulmonary edema, aspiration, with pneumonia on likely. In the setting of trauma, pulmonary contusions are possible. There is no pneumothorax. In the setting of orthopedic trauma, fat embolism is in the differential considerations although this is uncommon.  IMPRESSION: Development of bilateral multifocal airspace disease with differential considerations discussed above.   Electronically Signed   By: Dereck Ligas M.D.   On: 03/01/2013 15:12   Dg Knee Complete 4 Views Left  03/01/2013   CLINICAL DATA:  Left knee ex-fix  EXAM: DG C-ARM 1-60 MIN; LEFT KNEE - COMPLETE 4+ VIEW  COMPARISON:  DG C-ARM 1-60 MIN dated 02/27/2013; DG KNEE 1-2  VIEWS*L* dated 02/27/2013; DG KNEE COMPLETE 4 VIEWS*L* dated 02/27/2013; CT KNEE*L* W/O CM dated 02/27/2013  FLUOROSCOPY TIME:  Twenty-eight seconds  FINDINGS: Four spot intraoperative fluoroscopic images of the left knee are provided for review.  Coned images demonstrate the apparent sequela of placement of external fixation device hardware involving the distal femur and proximal tibia.  Re- demonstrated though incompletely evaluated known comminuted tibial plateau fracture.  IMPRESSION: Intraoperative radiographs during external fixation device placement as above.   Electronically Signed   By: Sandi Mariscal M.D.   On: 03/01/2013 13:14   Dg Knee Left Port  03/01/2013   CLINICAL DATA:  External fracture fixation adjustment.  EXAM: PORTABLE LEFT KNEE - 1-2 VIEW  COMPARISON:  02/28/2013 and 02/27/2013  FINDINGS: The proximal tibial fracture is stable when compared to the study dated 02/27/2013. On the AP view, there is an external fixator extending from the thigh, above the field of view, to the mid tibia. This is not seen on the lateral view.  IMPRESSION: External fixator adjustment.   Electronically Signed   By: Lajean Manes M.D.   On:  03/01/2013 13:35   Dg C-arm 1-60 Min  03/01/2013   CLINICAL DATA:  Left knee ex-fix  EXAM: DG C-ARM 1-60 MIN; LEFT KNEE - COMPLETE 4+ VIEW  COMPARISON:  DG C-ARM 1-60 MIN dated 02/27/2013; DG KNEE 1-2 VIEWS*L* dated 02/27/2013; DG KNEE COMPLETE 4 VIEWS*L* dated 02/27/2013; CT KNEE*L* W/O CM dated 02/27/2013  FLUOROSCOPY TIME:  Twenty-eight seconds  FINDINGS: Four spot intraoperative fluoroscopic images of the left knee are provided for review.  Coned images demonstrate the apparent sequela of placement of external fixation device hardware involving the distal femur and proximal tibia.  Re- demonstrated though incompletely evaluated known comminuted tibial plateau fracture.  IMPRESSION: Intraoperative radiographs during external fixation device placement as above.   Electronically Signed    By: Sandi Mariscal M.D.   On: 03/01/2013 13:14    Anti-infectives: Anti-infectives   Start     Dose/Rate Route Frequency Ordered Stop   02/27/13 2330  ceFAZolin (ANCEF) IVPB 2 g/50 mL premix     2 g 100 mL/hr over 30 Minutes Intravenous Every 6 hours 02/27/13 2155 02/28/13 0607   02/27/13 1815  [MAR Hold]  ceFAZolin (ANCEF) IVPB 2 g/50 mL premix     (On MAR Hold since 02/27/13 1827)   2 g 100 mL/hr over 30 Minutes Intravenous  Once 02/27/13 1803 02/27/13 1834      Assessment/Plan: s/p Procedure(s): REVISION OF EXTERNAL FIXATOR LEFT LEG Advance diet CXR  LOS: 3 days   Kathryne Eriksson. Dahlia Bailiff, MD, FACS [phone] Trauma Surgeon 03/02/2013
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
## 2013-03-02 NOTE — Progress Notes (Signed)
Patient's CXR is much better.  Okay to transfer out of the ICU.  Mild ileus.  [name] [name]. Dahlia Bailiff, MD, [place] [phone] Trauma Surgeon
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
## 2013-03-02 NOTE — Progress Notes (Signed)
UR completed.  Alexanderia Gorby, RN BSN [facility] CCM Trauma/Neuro ICU Case Manager [phone]  
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
## 2013-03-02 NOTE — Anesthesia Postprocedure Evaluation (Signed)
Anesthesia Post Note  Patient: [name] [name]  Procedure(s) Performed: Procedure(s) (LRB): REVISION OF EXTERNAL FIXATOR LEFT LEG (Left)  Anesthesia type: general  Patient location: PACU  Post pain: Pain level controlled  Post assessment: Patient's Cardiovascular Status Stable  Last Vitals:  Filed Vitals:   03/02/13 0800  BP: 134/71  Pulse: 106  Temp:   Resp: 14    Post vital signs: Reviewed and stable  Level of consciousness: sedated  Complications: No apparent anesthesia complications
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
## 2013-03-02 NOTE — Progress Notes (Signed)
Orthopaedic Trauma Service (OTS)  Subjective: 1 Day Post-Op Procedure(s) (LRB): REVISION OF EXTERNAL FIXATOR LEFT LEG (Left) Patient reports pain as mild.  Feels much better. Aspiration pneumonia sxs resolving, tachycardia improved.  Objective: Current Vitals Blood pressure 145/74, pulse 121, temperature 98.6 F (37 C), temperature source Oral, resp. rate 26, height 5' 10"  (1.778 m), weight 230 lb (104.327 kg), SpO2 96.00%. Vital signs in last 24 hours: Temp:  [97.5 F (36.4 C)-99.7 F (37.6 C)] 98.6 F (37 C) (02/27 1207) Pulse Rate:  [106-147] 121 (02/27 1000) Resp:  [14-30] 26 (02/27 1000) BP: (117-151)/(70-106) 145/74 mmHg (02/27 1000) SpO2:  [85 %-100 %] 96 % (02/27 1000)  Intake/Output from previous day: 02/26 0701 - 02/27 0700 In: 2229 [P.O.:120; I.V.:1275] Out: 1350 [Urine:1350]  LABS  Recent Labs  02/27/13 1603 03/01/13 0507  HGB 14.5 12.6*    Recent Labs  02/27/13 1603 03/01/13 0507  WBC 13.9* 7.7  RBC 5.26 4.65  HCT 41.5 37.3*  PLT 276 267    Recent Labs  02/28/13 0456 03/01/13 0507  NA 141 139  K 3.8 3.9  CL 102 99  CO2 25 30  BUN 9 6  CREATININE 1.26 1.20  GLUCOSE 130* 124*  CALCIUM 8.7 9.2   No results found for this basename: LABPT, INR,  in the last 72 hours  Physical Exam  LLE Sens DPN, SPN, TN intact  Motor EHL, ext, flex, evers intact  DP 2+, PT 2+  Slight drainage on dressing, edema well controlled  Imaging Dg Abd 1 View  03/01/2013   CLINICAL DATA:  Abdominal distention with nausea and low-grade fever  EXAM: ABDOMEN - 1 VIEW  COMPARISON:  None.  FINDINGS: The bowel gas pattern is nonspecific. There is a moderate amount of gas within small-bowel loops as well as a moderate amount of colonic gas. A small amount of gas is present within the rectum. There is stool in the right colon. No definite free extraluminal gas is demonstrated. There is contrast within the renal collecting systems and urinary bladder from the previous  chest CT scan.  IMPRESSION: The bowel gas pattern is nonspecific and may reflect a generalized ileus. A moderate amount of gas is present in both small and large bowel loops. Nasogastric suction may be useful.   Electronically Signed   By: David  Martinique   On: 03/01/2013 17:19   Ct Angio Chest Pe W/cm &/or Wo Cm  03/01/2013   CLINICAL DATA:  Sudden onset of dyspnea postoperatively.  EXAM: CT ANGIOGRAPHY CHEST WITH CONTRAST  TECHNIQUE: Multidetector CT imaging of the chest was performed using the standard protocol during bolus administration of intravenous contrast. Multiplanar CT image reconstructions and MIPs were obtained to evaluate the vascular anatomy.  CONTRAST:  110m OMNIPAQUE IOHEXOL 350 MG/ML SOLN  COMPARISON:  DG CHEST 1V PORT dated 03/01/2013  FINDINGS: There are bilateral confluent alveolar densities. These involve the right upper and lower lobes and the left lower lobe. The cardiac chambers are top-normal in size. Contrast within the pulmonary arterial tree is normal in appearance. There are no filling defects to suggest an acute pulmonary embolism. The caliber of the thoracic aorta is normal. There is thickening of the wall of the distal esophagus which may reflect esophagitis. A small hiatal hernia is suspected. No bulky mediastinal or hilar lymphadenopathy is demonstrated. There is no pleural or pericardial effusion.  Within the upper abdomen the stomach contains a moderate amount of fluid. The observed portions of the liver and spleen appear  normal.  The thoracic vertebral bodies and sternum appear normal where visualized. No acute abnormalities of the visualized portions of the ribs are demonstrated.  Review of the MIP images confirms the above findings.  IMPRESSION: 1. Bilateral alveolar infiltrates are consistent with acute aspiration pneumonia. 2. There is no evidence of an acute pulmonary embolism. 3. There is no evidence of CHF. There is no pleural nor pericardial effusion. 4. The wall of  the distal esophagus is thickened and there may be a small hiatal hernia. There is a moderate amount of fluid within the stomach. 5. These results were called by telephone at the time of interpretation on 03/01/2013 at 4:57 PM to Kasandra Knudsen, RN,, who verbally acknowledged these results.   Electronically Signed   By: David  Martinique   On: 03/01/2013 16:58   Dg Chest Port 1 View  03/02/2013   CLINICAL DATA:  43 year old male with respiratory distress. Initial encounter.  EXAM: PORTABLE CHEST - 1 VIEW  COMPARISON:  Chest CTA 01/29/2013.  FINDINGS: Portable AP semi upright view at 0839 hrs. Regressed but not resolved right greater than left perihilar pulmonary opacity. Improved lung volumes. No pneumothorax or effusion. No pulmonary edema. Stable cardiac size and mediastinal contours. Visualized tracheal air column is within normal limits.  IMPRESSION: Regressed but not resolved bilateral confluent perihilar airspace disease compatible with aspiration and/or pneumonia.   Electronically Signed   By: Lars Pinks M.D.   On: 03/02/2013 08:49   Dg Chest Port 1 View  03/01/2013   CLINICAL DATA:  Tachycardia.  Aspiration.  EXAM: PORTABLE CHEST - 1 VIEW  COMPARISON:  None.  FINDINGS: Multifocal airspace disease is present in the right upper lobe, right base and left infrahilar region. This may represent asymmetric pulmonary edema, aspiration, with pneumonia on likely. In the setting of trauma, pulmonary contusions are possible. There is no pneumothorax. In the setting of orthopedic trauma, fat embolism is in the differential considerations although this is uncommon.  IMPRESSION: Development of bilateral multifocal airspace disease with differential considerations discussed above.   Electronically Signed   By: Dereck Ligas M.D.   On: 03/01/2013 15:12   Dg Knee Complete 4 Views Left  03/01/2013   CLINICAL DATA:  Left knee ex-fix  EXAM: DG C-ARM 1-60 MIN; LEFT KNEE - COMPLETE 4+ VIEW  COMPARISON:  DG C-ARM 1-60 MIN  dated 02/27/2013; DG KNEE 1-2 VIEWS*L* dated 02/27/2013; DG KNEE COMPLETE 4 VIEWS*L* dated 02/27/2013; CT KNEE*L* W/O CM dated 02/27/2013  FLUOROSCOPY TIME:  Twenty-eight seconds  FINDINGS: Four spot intraoperative fluoroscopic images of the left knee are provided for review.  Coned images demonstrate the apparent sequela of placement of external fixation device hardware involving the distal femur and proximal tibia.  Re- demonstrated though incompletely evaluated known comminuted tibial plateau fracture.  IMPRESSION: Intraoperative radiographs during external fixation device placement as above.   Electronically Signed   By: Sandi Mariscal M.D.   On: 03/01/2013 13:14   Dg Knee Left Port  03/01/2013   CLINICAL DATA:  External fracture fixation adjustment.  EXAM: PORTABLE LEFT KNEE - 1-2 VIEW  COMPARISON:  02/28/2013 and 02/27/2013  FINDINGS: The proximal tibial fracture is stable when compared to the study dated 02/27/2013. On the AP view, there is an external fixator extending from the thigh, above the field of view, to the mid tibia. This is not seen on the lateral view.  IMPRESSION: External fixator adjustment.   Electronically Signed   By: Lajean Manes M.D.   On:  03/01/2013 13:35   Dg C-arm 1-60 Min  03/01/2013   CLINICAL DATA:  Left knee ex-fix  EXAM: DG C-ARM 1-60 MIN; LEFT KNEE - COMPLETE 4+ VIEW  COMPARISON:  DG C-ARM 1-60 MIN dated 02/27/2013; DG KNEE 1-2 VIEWS*L* dated 02/27/2013; DG KNEE COMPLETE 4 VIEWS*L* dated 02/27/2013; CT KNEE*L* W/O CM dated 02/27/2013  FLUOROSCOPY TIME:  Twenty-eight seconds  FINDINGS: Four spot intraoperative fluoroscopic images of the left knee are provided for review.  Coned images demonstrate the apparent sequela of placement of external fixation device hardware involving the distal femur and proximal tibia.  Re- demonstrated though incompletely evaluated known comminuted tibial plateau fracture.  IMPRESSION: Intraoperative radiographs during external fixation device placement as  above.   Electronically Signed   By: Sandi Mariscal M.D.   On: 03/01/2013 13:14    Assessment/Plan: 1 Day Post-Op Procedure(s) (LRB): REVISION OF EXTERNAL FIXATOR LEFT LEG (Left)  1. Followed up w trauma service re abx recs vs pneumonitis type supportive treatment with latter pursued now unless specific symptoms warrant initiation of abx. 2. Continue BB prn 3. Will need follow up with Dr. Kennedy Bucker at [facility] for Vit D, which could be exacerbated by Crohn's and previous GI surgery; start on Vit D now 4. Mild ileus but patient eating a hamburger and fries now. 5. Will transfer to floor.  [place], MD [facility] [phone] (p)  03/02/2013, 1:31 PM

## 2013-03-03 NOTE — Plan of Care (Deleted)
Problem: Phase III Progression Outcomes Goal: Discharge plan remains appropriate-arrangements made Outcome: Progressing Patient states his d/c plan is to leave hospital with mother and say at her home during recovery which includes Physical therapy along with home health. Patient states his mother will be visiting today from [place]. Travel arrangements not spoken of during this conversation. Further details anticipated during family visit today with mothers input.

## 2013-03-03 NOTE — Progress Notes (Signed)
Patient ID: [name] [name], male   DOB: [birth year], 43 y.o.   MRN: [id number]     Subjective:  Patient reports pain as mild to moderate.  Patient states that he is doing okay and that he was waiting on PT for today   Objective:   VITALS:   Filed Vitals:   03/02/13 1540 03/02/13 1608 03/02/13 2113 03/03/13 0558  BP:  137/79 129/74 131/76  Pulse:  124 100 100  Temp: 98.5 F (36.9 C) 100 F (37.8 C) 98 F (36.7 C) 98.7 F (37.1 C)  TempSrc: Oral Oral Oral Oral  Resp:  16 18 18   Height:      Weight:      SpO2:  97% 98% 99%    ABD soft Sensation intact distally Dorsiflexion/Plantar flexion intact Incision: dressing C/D/I and scant drainage   Lab Results  Component Value Date   WBC 7.7 03/01/2013   HGB 12.6* 03/01/2013   HCT 37.3* 03/01/2013   MCV 80.2 03/01/2013   PLT 267 03/01/2013     Assessment/Plan: 2 Days Post-Op   Active Problems:   Closed tibia fracture   Advance diet Up with therapy NWB left lower ext. Plan for [place], [place] 03/03/2013, 9:19 AM   Edmonia Lynch MD [phone]

## 2013-03-03 NOTE — Progress Notes (Signed)
OT NOTE  Patient with blue darco shoe don and foot plate positioned at heel. Pt reports "Dr handy put the shoe on."  OT calling Dr Percell Miller on call to verify that the [place] should be don with foot plate at this time. OT Wendi fabricated foot plate on 8/87/57 across metatarsal phalangeal joints attaching straps to external fixator bars. Dr Izetta Dakin that foot strap and darco shoe should be don together at this time.  Pt is to wear blue darco shoe with foot strap position at metatarsal phalangeal joints to create a 90 degree angle at the ankle with straps anchored to external fixator.  Elevation to LT LE important to decr edema.  Pt and family educated at this time on proper fit by OT   Jeri Modena   OTR/L Pager: [phone] Office: [phone] TIME: 8 mins fit check .

## 2013-03-03 NOTE — Progress Notes (Signed)
Patient ID: [name] [name], male   DOB: 07/21/1970, 43 y.o.   MRN: [id number] Trauma Service Note  Subjective: Pt breathing normally.  No shortness of breath.  Weaned off oxygen.  Still intermittently tachycardic.    Objective: Vital signs in last 24 hours: Temp:  [98 F (36.7 C)-100 F (37.8 C)] 98.7 F (37.1 C) (02/28 0558) Pulse Rate:  [100-129] 100 (02/28 0558) Resp:  [12-32] 18 (02/28 0558) BP: (112-145)/(70-94) 131/76 mmHg (02/28 0558) SpO2:  [94 %-100 %] 99 % (02/28 0558) Last BM Date: 02/26/13  Intake/Output from previous day: 02/27 0701 - 02/28 0700 In: 960 [P.O.:360; I.V.:600] Out: 2950 [Urine:2950] Intake/Output this shift: Total I/O In: 240 [P.O.:240] Out: -   General: No acute distress  Lungs: breathing comfortably.  Able to carry on conversation without dyspnea  Abd: Benign, slightly distended.  Extremities: No changes.  Ex fix in place on right.    Neuro: Intact  Lab Results: CBC   Recent Labs  03/01/13 0507  WBC 7.7  HGB 12.6*  HCT 37.3*  PLT 267   BMET  Recent Labs  03/01/13 0507  NA 139  K 3.9  CL 99  CO2 30  GLUCOSE 124*  BUN 6  CREATININE 1.20  CALCIUM 9.2   PT/INR No results found for this basename: LABPROT, INR,  in the last 72 hours ABG No results found for this basename: PHART, PCO2, PO2, HCO3,  in the last 72 hours  Studies/Results: Dg Abd 1 View  03/01/2013   CLINICAL DATA:  Abdominal distention with nausea and low-grade fever  EXAM: ABDOMEN - 1 VIEW  COMPARISON:  None.  FINDINGS: The bowel gas pattern is nonspecific. There is a moderate amount of gas within small-bowel loops as well as a moderate amount of colonic gas. A small amount of gas is present within the rectum. There is stool in the right colon. No definite free extraluminal gas is demonstrated. There is contrast within the renal collecting systems and urinary bladder from the previous chest CT scan.  IMPRESSION: The bowel gas pattern is nonspecific and may  reflect a generalized ileus. A moderate amount of gas is present in both small and large bowel loops. Nasogastric suction may be useful.   Electronically Signed   By: David  Martinique   On: 03/01/2013 17:19   Ct Angio Chest Pe W/cm &/or Wo Cm  03/01/2013   CLINICAL DATA:  Sudden onset of dyspnea postoperatively.  EXAM: CT ANGIOGRAPHY CHEST WITH CONTRAST  TECHNIQUE: Multidetector CT imaging of the chest was performed using the standard protocol during bolus administration of intravenous contrast. Multiplanar CT image reconstructions and MIPs were obtained to evaluate the vascular anatomy.  CONTRAST:  125m OMNIPAQUE IOHEXOL 350 MG/ML SOLN  COMPARISON:  DG CHEST 1V PORT dated 03/01/2013  FINDINGS: There are bilateral confluent alveolar densities. These involve the right upper and lower lobes and the left lower lobe. The cardiac chambers are top-normal in size. Contrast within the pulmonary arterial tree is normal in appearance. There are no filling defects to suggest an acute pulmonary embolism. The caliber of the thoracic aorta is normal. There is thickening of the wall of the distal esophagus which may reflect esophagitis. A small hiatal hernia is suspected. No bulky mediastinal or hilar lymphadenopathy is demonstrated. There is no pleural or pericardial effusion.  Within the upper abdomen the stomach contains a moderate amount of fluid. The observed portions of the liver and spleen appear normal.  The thoracic vertebral bodies and sternum appear  normal where visualized. No acute abnormalities of the visualized portions of the ribs are demonstrated.  Review of the MIP images confirms the above findings.  IMPRESSION: 1. Bilateral alveolar infiltrates are consistent with acute aspiration pneumonia. 2. There is no evidence of an acute pulmonary embolism. 3. There is no evidence of CHF. There is no pleural nor pericardial effusion. 4. The wall of the distal esophagus is thickened and there may be a small hiatal hernia.  There is a moderate amount of fluid within the stomach. 5. These results were called by telephone at the time of interpretation on 03/01/2013 at 4:57 PM to Kasandra Knudsen, RN,, who verbally acknowledged these results.   Electronically Signed   By: David  Martinique   On: 03/01/2013 16:58   Dg Chest Port 1 View  03/02/2013   CLINICAL DATA:  43 year old male with respiratory distress. Initial encounter.  EXAM: PORTABLE CHEST - 1 VIEW  COMPARISON:  Chest CTA 01/29/2013.  FINDINGS: Portable AP semi upright view at 0839 hrs. Regressed but not resolved right greater than left perihilar pulmonary opacity. Improved lung volumes. No pneumothorax or effusion. No pulmonary edema. Stable cardiac size and mediastinal contours. Visualized tracheal air column is within normal limits.  IMPRESSION: Regressed but not resolved bilateral confluent perihilar airspace disease compatible with aspiration and/or pneumonia.   Electronically Signed   By: Lars Pinks M.D.   On: 03/02/2013 08:49   Dg Chest Port 1 View  03/01/2013   CLINICAL DATA:  Tachycardia.  Aspiration.  EXAM: PORTABLE CHEST - 1 VIEW  COMPARISON:  None.  FINDINGS: Multifocal airspace disease is present in the right upper lobe, right base and left infrahilar region. This may represent asymmetric pulmonary edema, aspiration, with pneumonia on likely. In the setting of trauma, pulmonary contusions are possible. There is no pneumothorax. In the setting of orthopedic trauma, fat embolism is in the differential considerations although this is uncommon.  IMPRESSION: Development of bilateral multifocal airspace disease with differential considerations discussed above.   Electronically Signed   By: Dereck Ligas M.D.   On: 03/01/2013 15:12   Dg Knee Complete 4 Views Left  03/01/2013   CLINICAL DATA:  Left knee ex-fix  EXAM: DG C-ARM 1-60 MIN; LEFT KNEE - COMPLETE 4+ VIEW  COMPARISON:  DG C-ARM 1-60 MIN dated 02/27/2013; DG KNEE 1-2 VIEWS*L* dated 02/27/2013; DG KNEE COMPLETE 4  VIEWS*L* dated 02/27/2013; CT KNEE*L* W/O CM dated 02/27/2013  FLUOROSCOPY TIME:  Twenty-eight seconds  FINDINGS: Four spot intraoperative fluoroscopic images of the left knee are provided for review.  Coned images demonstrate the apparent sequela of placement of external fixation device hardware involving the distal femur and proximal tibia.  Re- demonstrated though incompletely evaluated known comminuted tibial plateau fracture.  IMPRESSION: Intraoperative radiographs during external fixation device placement as above.   Electronically Signed   By: Sandi Mariscal M.D.   On: 03/01/2013 13:14   Dg Knee Left Port  03/01/2013   CLINICAL DATA:  External fracture fixation adjustment.  EXAM: PORTABLE LEFT KNEE - 1-2 VIEW  COMPARISON:  02/28/2013 and 02/27/2013  FINDINGS: The proximal tibial fracture is stable when compared to the study dated 02/27/2013. On the AP view, there is an external fixator extending from the thigh, above the field of view, to the mid tibia. This is not seen on the lateral view.  IMPRESSION: External fixator adjustment.   Electronically Signed   By: Lajean Manes M.D.   On: 03/01/2013 13:35   Dg C-arm 1-60 Min  03/01/2013   CLINICAL DATA:  Left knee ex-fix  EXAM: DG C-ARM 1-60 MIN; LEFT KNEE - COMPLETE 4+ VIEW  COMPARISON:  DG C-ARM 1-60 MIN dated 02/27/2013; DG KNEE 1-2 VIEWS*L* dated 02/27/2013; DG KNEE COMPLETE 4 VIEWS*L* dated 02/27/2013; CT KNEE*L* W/O CM dated 02/27/2013  FLUOROSCOPY TIME:  Twenty-eight seconds  FINDINGS: Four spot intraoperative fluoroscopic images of the left knee are provided for review.  Coned images demonstrate the apparent sequela of placement of external fixation device hardware involving the distal femur and proximal tibia.  Re- demonstrated though incompletely evaluated known comminuted tibial plateau fracture.  IMPRESSION: Intraoperative radiographs during external fixation device placement as above.   Electronically Signed   By: Sandi Mariscal M.D.   On: 03/01/2013  13:14    Anti-infectives: Anti-infectives   Start     Dose/Rate Route Frequency Ordered Stop   02/27/13 2330  ceFAZolin (ANCEF) IVPB 2 g/50 mL premix     2 g 100 mL/hr over 30 Minutes Intravenous Every 6 hours 02/27/13 2155 02/28/13 0607   02/27/13 1815  [MAR Hold]  ceFAZolin (ANCEF) IVPB 2 g/50 mL premix     (On MAR Hold since 02/27/13 1827)   2 g 100 mL/hr over 30 Minutes Intravenous  Once 02/27/13 1803 02/27/13 1834      Assessment/Plan: s/p Procedure(s): REVISION OF EXTERNAL FIXATOR LEFT LEG, fat embolus Will sign off given transition to room air and lack of pulmonary symptoms.   Call for concerns.    LOS: 4 days  03/03/2013

## 2013-03-03 NOTE — Progress Notes (Signed)
I participated in the care of this patient and agree with the above history, physical and evaluation. I performed a review of the history and a physical exam as detailed   Carole Binning MD
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
## 2013-03-04 LAB — VITAMIN D 1,25 DIHYDROXY
Vitamin D 1, 25 (OH)2 Total: 78 pg/mL — ABNORMAL HIGH (ref 18–72)
Vitamin D2 1, 25 (OH)2: <8 pg/mL
Vitamin D3 1, 25 (OH)2: 78 pg/mL

## 2013-03-04 MED ORDER — MAGNESIUM CITRATE PO SOLN
1.0000 | Freq: Once | ORAL | Status: AC
  Administered 2013-03-04: 1 via ORAL
  Filled 2013-03-04: qty 296
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
## 2013-03-04 NOTE — Progress Notes (Signed)
Patient ID: [name] [name], male   DOB: 1970/10/24, 43 y.o.   MRN: [id number]     Subjective:  Patient reports pain as mild to moderate.  Patient has not been able to activate his bowels will try other methods.  Objective:   VITALS:   Filed Vitals:   03/03/13 1100 03/03/13 1522 03/03/13 2201 03/04/13 0605  BP: 131/76 135/79 138/87 124/88  Pulse: 100 119 113 103  Temp: 98.7 F (37.1 C) 98.1 F (36.7 C) 98.2 F (36.8 C) 98.2 F (36.8 C)  TempSrc: Oral Oral Oral Oral  Resp: 18 18 18 18   Height:      Weight:      SpO2: 99% 99% 96% 97%    ABD soft Sensation intact distally Dorsiflexion/Plantar flexion intact Incision: dressing C/D/I and scant drainage   Lab Results  Component Value Date   WBC 7.7 03/01/2013   HGB 12.6* 03/01/2013   HCT 37.3* 03/01/2013   MCV 80.2 03/01/2013   PLT 267 03/01/2013     Assessment/Plan: 3 Days Post-Op   Active Problems:   Closed tibia fracture   Advance diet Up with therapy Plan for discharge tomorrow Plan for Mag cite May DC home later today if bowels activate   Remonia Richter 03/04/2013, 10:05 AM   Edmonia Lynch MD [phone]
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
## 2013-03-05 DIAGNOSIS — S82143A Displaced bicondylar fracture of unspecified tibia, initial encounter for closed fracture: Secondary | ICD-10-CM | POA: Diagnosis present

## 2013-03-05 DIAGNOSIS — E559 Vitamin D deficiency, unspecified: Secondary | ICD-10-CM | POA: Diagnosis present
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
## 2013-03-05 NOTE — Progress Notes (Signed)
Rehab Admissions Coordinator Note:  Patient was screened by [name] [name] for appropriateness for an Inpatient Acute Rehab Consult.  At this time, we are recommending Inpatient Rehab consult.  [name] [name] [name] 03/05/2013, 10:04 AM  I can be reached at [phone].

## 2013-03-05 NOTE — Progress Notes (Signed)
03/06/13 PT and OT recommending inpatient rehab. Contacted [phone], with Workers Comp and informed her of recommendation and pending eval by inpatient rehab. She stated that inpatient rehab or SNF level rehab should not be a problem. CM will update Estill Bamberg once inpatient rehab eval has been completed. Patient and wife updated. They are agreeable to SNF if CIR not appropriate. Referral made to CSW. Will continue to follow. Fuller Plan RN, BSN, CCM
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
## 2013-03-06 ENCOUNTER — Encounter (HOSPITAL_COMMUNITY): Payer: Self-pay | Admitting: Orthopedic Surgery

## 2013-03-06 DIAGNOSIS — S82109A Unspecified fracture of upper end of unspecified tibia, initial encounter for closed fracture: Secondary | ICD-10-CM

## 2013-03-06 LAB — CREATININE, SERUM
CREATININE: 1.12 mg/dL (ref 0.50–1.35)
GFR calc Af Amer: >90 mL/min (ref >90)
GFR calc non Af Amer: 80 mL/min — ABNORMAL LOW (ref >90)

## 2013-03-06 NOTE — Clinical Social Work Placement (Signed)
Clinical Social Work Department  CLINICAL SOCIAL WORK PLACEMENT NOTE   Patient:[name] [name]  Account Number: [id number]  Admit date: 02/27/13 Clinical Social Worker: Rhea Pink LCSWA Date/time: 03/06/2013 11:30 AM  Clinical Social Work is seeking post-discharge placement for this patient at the following level of care: SKILLED NURSING (*CSW will update this form in Epic as items are completed)  03/06/2013 Patient/family provided with [facility] Department of Clinical Social Work's list of facilities offering this level of care within the geographic area requested by the patient (or if unable, by the patient's family).  03/06/2013 Patient/family informed of their freedom to choose among providers that offer the needed level of care, that participate in Medicare, Medicaid or managed care program needed by the patient, have an available bed and are willing to accept the patient.  3/3/2015Patient/family informed of [facility]' ownership interest in [facility], as well as of the fact that they are under no obligation to receive care at this facility.  PASARR submitted to EDS on 03/06/2013 PASARR number received from EDS on 03/06/2013 FL2 transmitted to all facilities in geographic area requested by pt/family on 03/06/2013 FL2 transmitted to all facilities within larger geographic area on  Patient informed that his/her managed care company has contracts with or will negotiate with certain facilities, including the following:  Patient/family informed of bed offers received:  Patient chooses bed at  Physician recommends and patient chooses bed at  Patient to be transferred to on  Patient to be transferred to facility by  The following physician request were entered in Epic:  Additional Comments:
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
## 2013-03-06 NOTE — Progress Notes (Signed)
I participated in the care of this patient and agree with the above history, physical and evaluation. I performed a review of the history and a physical exam as detailed   Carole Binning MD
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
## 2013-03-06 NOTE — Clinical Social Work Psychosocial (Signed)
Clinical Social Work Department  BRIEF PSYCHOSOCIAL ASSESSMENT  Patient: [name] [name]  Account Number: [id number]   Admit date: 02/27/13 Clinical Social Worker Rhea Pink, MSW Date/Time: 03/06/2013 2:00 PM Referred by: Physician Date Referred: NA Referred for   SNF Placement   Other Referral:  Interview type: Patient and patient's wife  Other interview type: PSYCHOSOCIAL DATA  Living Status: Spouse Admitted from facility:  Level of care:  Primary support name: [name] [name],[name] [name]  Primary support relationship to patient: Spouse Degree of support available:  Strong and vested  CURRENT CONCERNS  Current Concerns   Post-Acute Placement   Other Concerns:  SOCIAL WORK ASSESSMENT / PLAN  CSW met with pt re: PT recommendation for SNF.   Pt lives with his spouse  CSW explained placement process and answered questions.   Pt reports [facility] as their preference    CSW completed FL2 and initiated SNF search.     Assessment/plan status: Information/Referral to [employer]  Other assessment/ plan:  Information/referral to community resources:  SNF     PATIENT'S/FAMILY'S RESPONSE TO PLAN OF CARE:  Pt  reports he is agreeable to ST SNF in order to increase strength and independence with mobility prior to returning home. Patient reported no anxieties and appeared quite calm.  Pt verbalized understanding of placement process and appreciation for CSW assist.   Rhea Pink, MSW [phone]

## 2013-03-07 MED ORDER — CEFAZOLIN SODIUM-DEXTROSE 2-3 GM-% IV SOLR
2.0000 g | Freq: Once | INTRAVENOUS | Status: AC
  Administered 2013-03-08 (×2): 2 g via INTRAVENOUS
  Filled 2013-03-07: qty 50

## 2013-03-07 MED ORDER — LACTATED RINGERS IV SOLN
INTRAVENOUS | Status: DC
  Administered 2013-03-08 – 2013-03-11 (×4): via INTRAVENOUS

## 2013-03-07 MED ORDER — LACTATED RINGERS IV SOLN
INTRAVENOUS | Status: DC
  Administered 2013-03-07: 20 mL/h via INTRAVENOUS
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
## 2013-03-07 NOTE — Progress Notes (Signed)
Rehab admissions - Evaluated for possible admission.  I spoke with patient and he would like to come to inpatient rehab here in the hospital.  I called his adjuster, Estill Bamberg.  Estill Bamberg will speak with patient and then decide on venue of care needed after surgery.  Patient will likely need some kind of rehab after his surgery.  Call me for questions.  #[phone]

## 2013-03-07 NOTE — Care Management Note (Signed)
03/07/13 Ricki Miller, RN BSN Case Manager [phone] CM spoke with patient's adjuster-Amanda [phone]. Discussed patient returning to surgery on 03/08/13 for ORIF. CM faxed orders for DME to her at [phone]. Estill Bamberg will follow up concerning patient's disposition- home versus SNF for shortterm recovery. Case manager will continue to monitor.
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
## 2013-03-08 ENCOUNTER — Inpatient Hospital Stay (HOSPITAL_COMMUNITY): Payer: Worker's Compensation | Admitting: Critical Care Medicine

## 2013-03-08 ENCOUNTER — Encounter (HOSPITAL_COMMUNITY): Payer: Self-pay | Admitting: Critical Care Medicine

## 2013-03-08 ENCOUNTER — Inpatient Hospital Stay (HOSPITAL_COMMUNITY): Payer: Worker's Compensation

## 2013-03-08 ENCOUNTER — Encounter (HOSPITAL_COMMUNITY): Payer: Worker's Compensation | Admitting: Critical Care Medicine

## 2013-03-08 ENCOUNTER — Encounter (HOSPITAL_COMMUNITY)
Admission: EM | Disposition: A | Discharge status: Skilled Nursing Facility | Payer: Self-pay | Source: Home / Self Care | Attending: Orthopedic Surgery

## 2013-03-08 HISTORY — PX: ORIF TIBIA FRACTURE: SHX5416

## 2013-03-08 LAB — COMPREHENSIVE METABOLIC PANEL
ALBUMIN: 2.8 g/dL — AB (ref 3.5–5.2)
ALT: 60 U/L — AB (ref 0–53)
AST: 30 U/L (ref 0–37)
Alkaline Phosphatase: 104 U/L (ref 39–117)
BUN: 17 mg/dL (ref 6–23)
CO2: 25 mEq/L (ref 19–32)
Calcium: 9.6 mg/dL (ref 8.4–10.5)
Chloride: 101 mEq/L (ref 96–112)
Creatinine, Ser: 1.22 mg/dL (ref 0.50–1.35)
GFR calc Af Amer: 84 mL/min — ABNORMAL LOW (ref >90)
GFR calc non Af Amer: 72 mL/min — ABNORMAL LOW (ref >90)
Glucose, Bld: 105 mg/dL — ABNORMAL HIGH (ref 70–99)
POTASSIUM: 4.4 meq/L (ref 3.7–5.3)
SODIUM: 141 meq/L (ref 137–147)
TOTAL PROTEIN: 7.2 g/dL (ref 6.0–8.3)
Total Bilirubin: 0.8 mg/dL (ref 0.3–1.2)

## 2013-03-08 LAB — PROTIME-INR
INR: 1.06 (ref 0.00–1.49)
PROTHROMBIN TIME: 13.6 s (ref 11.6–15.2)

## 2013-03-08 LAB — TYPE AND SCREEN
ABO/RH(D): O NEG
Antibody Screen: NEGATIVE

## 2013-03-08 LAB — CBC
HEMATOCRIT: 35.5 % — AB (ref 39.0–52.0)
Hemoglobin: 12.1 g/dL — ABNORMAL LOW (ref 13.0–17.0)
MCH: 26.8 pg (ref 26.0–34.0)
MCHC: 34.1 g/dL (ref 30.0–36.0)
MCV: 78.5 fL (ref 78.0–100.0)
Platelets: 519 10*3/uL — ABNORMAL HIGH (ref 150–400)
RBC: 4.52 MIL/uL (ref 4.22–5.81)
RDW: 14.2 % (ref 11.5–15.5)
WBC: 10.6 10*3/uL — AB (ref 4.0–10.5)

## 2013-03-08 LAB — SURGICAL PCR SCREEN
MRSA, PCR: NEGATIVE
Staphylococcus aureus: NEGATIVE

## 2013-03-08 SURGERY — OPEN REDUCTION INTERNAL FIXATION (ORIF) TIBIA FRACTURE
Anesthesia: General | Site: Leg Upper | Laterality: Left

## 2013-03-08 MED ORDER — PROPRANOLOL HCL 1 MG/ML IV SOLN
INTRAVENOUS | Status: AC
  Filled 2013-03-08: qty 1

## 2013-03-08 MED ORDER — LIDOCAINE HCL (CARDIAC) 20 MG/ML IV SOLN
INTRAVENOUS | Status: AC
  Filled 2013-03-08: qty 5

## 2013-03-08 MED ORDER — SUCCINYLCHOLINE CHLORIDE 20 MG/ML IJ SOLN
INTRAMUSCULAR | Status: AC
  Filled 2013-03-08: qty 1

## 2013-03-08 MED ORDER — OXYCODONE HCL 5 MG/5ML PO SOLN: 5.0000 mg | Freq: Once | ORAL | Status: AC | PRN

## 2013-03-08 MED ORDER — LIDOCAINE HCL (CARDIAC) 20 MG/ML IV SOLN
INTRAVENOUS | Status: DC | PRN
  Administered 2013-03-08: 100 mg via INTRAVENOUS

## 2013-03-08 MED ORDER — OXYCODONE HCL 5 MG PO TABS
ORAL_TABLET | ORAL | Status: AC
  Filled 2013-03-08: qty 1

## 2013-03-08 MED ORDER — PROPOFOL 10 MG/ML IV BOLUS
INTRAVENOUS | Status: DC | PRN
  Administered 2013-03-08: 200 mg via INTRAVENOUS

## 2013-03-08 MED ORDER — OXYCODONE HCL 5 MG PO TABS
5.0000 mg | ORAL_TABLET | Freq: Once | ORAL | Status: AC | PRN
  Administered 2013-03-08: 5 mg via ORAL

## 2013-03-08 MED ORDER — FENTANYL CITRATE 0.05 MG/ML IJ SOLN
INTRAMUSCULAR | Status: AC
  Filled 2013-03-08: qty 5

## 2013-03-08 MED ORDER — CEFAZOLIN SODIUM-DEXTROSE 2-3 GM-% IV SOLR
INTRAVENOUS | Status: AC
  Filled 2013-03-08: qty 50

## 2013-03-08 MED ORDER — HYDROMORPHONE HCL PF 1 MG/ML IJ SOLN
INTRAMUSCULAR | Status: AC
  Filled 2013-03-08: qty 2

## 2013-03-08 MED ORDER — PHENYLEPHRINE HCL 10 MG/ML IJ SOLN
INTRAMUSCULAR | Status: DC | PRN
  Administered 2013-03-08 (×3): 40 ug via INTRAVENOUS

## 2013-03-08 MED ORDER — 0.9 % SODIUM CHLORIDE (POUR BTL) OPTIME
TOPICAL | Status: DC | PRN
  Administered 2013-03-08: 1000 mL

## 2013-03-08 MED ORDER — NEOSTIGMINE METHYLSULFATE 1 MG/ML IJ SOLN
INTRAMUSCULAR | Status: DC | PRN
  Administered 2013-03-08: 4 mg via INTRAVENOUS

## 2013-03-08 MED ORDER — NEOSTIGMINE METHYLSULFATE 1 MG/ML IJ SOLN
INTRAMUSCULAR | Status: AC
  Filled 2013-03-08: qty 10

## 2013-03-08 MED ORDER — PHENYLEPHRINE 40 MCG/ML (10ML) SYRINGE FOR IV PUSH (FOR BLOOD PRESSURE SUPPORT)
PREFILLED_SYRINGE | INTRAVENOUS | Status: AC
  Filled 2013-03-08: qty 10

## 2013-03-08 MED ORDER — ROCURONIUM BROMIDE 50 MG/5ML IV SOLN
INTRAVENOUS | Status: AC
  Filled 2013-03-08: qty 1

## 2013-03-08 MED ORDER — LACTATED RINGERS IV SOLN
INTRAVENOUS | Status: DC | PRN
  Administered 2013-03-08 (×4): via INTRAVENOUS

## 2013-03-08 MED ORDER — ONDANSETRON HCL 4 MG/2ML IJ SOLN
INTRAMUSCULAR | Status: DC | PRN
  Administered 2013-03-08: 4 mg via INTRAVENOUS

## 2013-03-08 MED ORDER — MIDAZOLAM HCL 5 MG/5ML IJ SOLN
INTRAMUSCULAR | Status: DC | PRN
  Administered 2013-03-08: 2 mg via INTRAVENOUS

## 2013-03-08 MED ORDER — MIDAZOLAM HCL 2 MG/2ML IJ SOLN
INTRAMUSCULAR | Status: AC
  Filled 2013-03-08: qty 2

## 2013-03-08 MED ORDER — PROPRANOLOL HCL 1 MG/ML IV SOLN
INTRAVENOUS | Status: DC | PRN
  Administered 2013-03-08 (×8): .25 mg via INTRAVENOUS

## 2013-03-08 MED ORDER — PROMETHAZINE HCL 25 MG/ML IJ SOLN: 6.2500 mg | INTRAMUSCULAR | Status: DC | PRN

## 2013-03-08 MED ORDER — ROCURONIUM BROMIDE 100 MG/10ML IV SOLN
INTRAVENOUS | Status: DC | PRN
  Administered 2013-03-08 (×2): 10 mg via INTRAVENOUS
  Administered 2013-03-08: 50 mg via INTRAVENOUS
  Administered 2013-03-08 (×2): 10 mg via INTRAVENOUS

## 2013-03-08 MED ORDER — HYDROMORPHONE HCL PF 1 MG/ML IJ SOLN
0.2500 mg | INTRAMUSCULAR | Status: DC | PRN
  Administered 2013-03-08 (×4): 0.5 mg via INTRAVENOUS

## 2013-03-08 MED ORDER — ROCURONIUM BROMIDE 50 MG/5ML IV SOLN
INTRAVENOUS | Status: AC
Start: 2013-03-08 — End: 2013-03-08
  Filled 2013-03-08: qty 1

## 2013-03-08 MED ORDER — FENTANYL CITRATE 0.05 MG/ML IJ SOLN
INTRAMUSCULAR | Status: DC | PRN
  Administered 2013-03-08 (×7): 50 ug via INTRAVENOUS
  Administered 2013-03-08: 25 ug via INTRAVENOUS
  Administered 2013-03-08 (×5): 50 ug via INTRAVENOUS
  Administered 2013-03-08: 25 ug via INTRAVENOUS
  Administered 2013-03-08 (×4): 50 ug via INTRAVENOUS

## 2013-03-08 MED ORDER — GLYCOPYRROLATE 0.2 MG/ML IJ SOLN
INTRAMUSCULAR | Status: AC
  Filled 2013-03-08: qty 3

## 2013-03-08 MED ORDER — GLYCOPYRROLATE 0.2 MG/ML IJ SOLN
INTRAMUSCULAR | Status: DC | PRN
  Administered 2013-03-08: 0.6 mg via INTRAVENOUS

## 2013-03-08 MED ORDER — CEFAZOLIN SODIUM 1-5 GM-% IV SOLN
1.0000 g | Freq: Three times a day (TID) | INTRAVENOUS | Status: AC
  Administered 2013-03-08 – 2013-03-09 (×3): 1 g via INTRAVENOUS
  Filled 2013-03-08 (×3): qty 50

## 2013-03-08 SURGICAL SUPPLY — 94 items
BANDAGE ELASTIC 4 VELCRO ST LF (GAUZE/BANDAGES/DRESSINGS) ×6 IMPLANT
BANDAGE ELASTIC 6 VELCRO ST LF (GAUZE/BANDAGES/DRESSINGS) ×3 IMPLANT
BANDAGE ESMARK 6X9 LF (GAUZE/BANDAGES/DRESSINGS) ×1 IMPLANT
BANDAGE GAUZE ELAST BULKY 4 IN (GAUZE/BANDAGES/DRESSINGS) ×3 IMPLANT
BIT DRILL 2.5X2.75 QC CALB (BIT) ×3 IMPLANT
BLADE SURG 10 STRL SS (BLADE) IMPLANT
BLADE SURG 15 STRL LF DISP TIS (BLADE) IMPLANT
BLADE SURG 15 STRL SS (BLADE)
BLADE SURG ROTATE 9660 (MISCELLANEOUS) IMPLANT
BNDG COHESIVE 4X5 TAN STRL (GAUZE/BANDAGES/DRESSINGS) ×3 IMPLANT
BNDG ESMARK 6X9 LF (GAUZE/BANDAGES/DRESSINGS) ×3
BNDG GAUZE ELAST 4 BULKY (GAUZE/BANDAGES/DRESSINGS) ×6 IMPLANT
BRUSH SCRUB DISP (MISCELLANEOUS) ×6 IMPLANT
COMPONENT TIB PRX PSTMD 3.5 2H (Orthopedic Implant) ×1 IMPLANT
COVER MAYO STAND STRL (DRAPES) ×3 IMPLANT
DRAPE C-ARM 42X72 X-RAY (DRAPES) ×3 IMPLANT
DRAPE C-ARMOR (DRAPES) ×3 IMPLANT
DRAPE INCISE IOBAN 66X45 STRL (DRAPES) ×6 IMPLANT
DRAPE ORTHO SPLIT 77X108 STRL (DRAPES)
DRAPE PROXIMA HALF (DRAPES) ×3 IMPLANT
DRAPE SURG ORHT 6 SPLT 77X108 (DRAPES) IMPLANT
DRAPE U-SHAPE 47X51 STRL (DRAPES) ×3 IMPLANT
DRILL BIT 2.5X100 214235007 (MISCELLANEOUS) ×3 IMPLANT
DRILL BIT 2.7X100 214235006 DU (MISCELLANEOUS) ×3 IMPLANT
DRSG ADAPTIC 3X8 NADH LF (GAUZE/BANDAGES/DRESSINGS) ×6 IMPLANT
DRSG PAD ABDOMINAL 8X10 ST (GAUZE/BANDAGES/DRESSINGS) ×12 IMPLANT
ELECT REM PT RETURN 9FT ADLT (ELECTROSURGICAL) ×3
ELECTRODE REM PT RTRN 9FT ADLT (ELECTROSURGICAL) ×1 IMPLANT
EVACUATOR 1/8 PVC DRAIN (DRAIN) IMPLANT
EVACUATOR 3/16  PVC DRAIN (DRAIN)
EVACUATOR 3/16 PVC DRAIN (DRAIN) IMPLANT
GLOVE BIO SURGEON STRL SZ7.5 (GLOVE) ×3 IMPLANT
GLOVE BIO SURGEON STRL SZ8 (GLOVE) ×3 IMPLANT
GLOVE BIOGEL PI IND STRL 7.5 (GLOVE) ×1 IMPLANT
GLOVE BIOGEL PI IND STRL 8 (GLOVE) ×1 IMPLANT
GLOVE BIOGEL PI INDICATOR 7.5 (GLOVE) ×2
GLOVE BIOGEL PI INDICATOR 8 (GLOVE) ×2
GOWN STRL REUS W/ TWL LRG LVL3 (GOWN DISPOSABLE) ×2 IMPLANT
GOWN STRL REUS W/ TWL XL LVL3 (GOWN DISPOSABLE) ×1 IMPLANT
GOWN STRL REUS W/TWL LRG LVL3 (GOWN DISPOSABLE) ×4
GOWN STRL REUS W/TWL XL LVL3 (GOWN DISPOSABLE) ×2
IMMOBILIZER KNEE 22 UNIV (SOFTGOODS) ×3 IMPLANT
K-WIRE ACE 1.6X6 (WIRE) ×24
KIT BASIN OR (CUSTOM PROCEDURE TRAY) ×3 IMPLANT
KIT ROOM TURNOVER OR (KITS) ×3 IMPLANT
KWIRE ACE 1.6X6 (WIRE) ×8 IMPLANT
MANIFOLD NEPTUNE II (INSTRUMENTS) ×3 IMPLANT
NDL SUT .5 MAYO 1.404X.05X (NEEDLE) IMPLANT
NEEDLE 22X1 1/2 (OR ONLY) (NEEDLE) IMPLANT
NEEDLE MAYO TAPER (NEEDLE)
NS IRRIG 1000ML POUR BTL (IV SOLUTION) ×3 IMPLANT
PACK ORTHO EXTREMITY (CUSTOM PROCEDURE TRAY) ×3 IMPLANT
PAD ABD 8X10 STRL (GAUZE/BANDAGES/DRESSINGS) ×6 IMPLANT
PAD ARMBOARD 7.5X6 YLW CONV (MISCELLANEOUS) ×6 IMPLANT
PAD CAST 4YDX4 CTTN HI CHSV (CAST SUPPLIES) ×1 IMPLANT
PADDING CAST COTTON 4X4 STRL (CAST SUPPLIES) ×2
PADDING CAST COTTON 6X4 STRL (CAST SUPPLIES) ×3 IMPLANT
PASSER SUT SWANSON 36MM LOOP (INSTRUMENTS) ×3 IMPLANT
PLATE LOCK 7H STD LT PROX TIB (Plate) ×3 IMPLANT
PLATE LOCK COMP 5H FOOT (Plate) ×3 IMPLANT
PUTTY GAMMA BSM 10CC (Putty) ×3 IMPLANT
PUTTY GAMMA BSM 5CC (Putty) ×3 IMPLANT
SCREW CORTEX 3.5 40MM (Screw) ×2 IMPLANT
SCREW CORTEX 3.5 45MM (Screw) ×3 IMPLANT
SCREW LOCK CORT ST 3.5X40 (Screw) ×1 IMPLANT
SCREW LOCK CORT STAR 3.5X36 (Screw) ×3 IMPLANT
SCREW LOCK CORT STAR 3.5X42 (Screw) ×3 IMPLANT
SCREW LOCK CORT STAR 3.5X70 (Screw) ×3 IMPLANT
SCREW LOCK CORT STAR 3.5X75 (Screw) ×12 IMPLANT
SCREW LOCK CORT STAR 3.5X80 (Screw) ×9 IMPLANT
SPONGE GAUZE 4X4 12PLY (GAUZE/BANDAGES/DRESSINGS) ×6 IMPLANT
SPONGE LAP 18X18 X RAY DECT (DISPOSABLE) ×9 IMPLANT
STAPLER VISISTAT 35W (STAPLE) ×3 IMPLANT
STOCKINETTE IMPERVIOUS LG (DRAPES) ×3 IMPLANT
SUCTION FRAZIER TIP 10 FR DISP (SUCTIONS) ×6 IMPLANT
SUT ETHILON 3 0 PS 1 (SUTURE) ×6 IMPLANT
SUT FIBERWIRE #2 38 T-5 BLUE (SUTURE) ×6
SUT PROLENE 0 CT 2 (SUTURE) ×18 IMPLANT
SUT VIC AB 0 CT1 27 (SUTURE) ×2
SUT VIC AB 0 CT1 27XBRD ANBCTR (SUTURE) ×1 IMPLANT
SUT VIC AB 1 CT1 27 (SUTURE) ×8
SUT VIC AB 1 CT1 27XBRD ANBCTR (SUTURE) ×4 IMPLANT
SUT VIC AB 2-0 CT1 27 (SUTURE) ×10
SUT VIC AB 2-0 CT1 TAPERPNT 27 (SUTURE) ×5 IMPLANT
SUTURE FIBERWR #2 38 T-5 BLUE (SUTURE) ×2 IMPLANT
SYR 20ML ECCENTRIC (SYRINGE) IMPLANT
TIBIA PROX POSTMED LCP 3.5 2H (Orthopedic Implant) ×3 IMPLANT
TOWEL OR 17X24 6PK STRL BLUE (TOWEL DISPOSABLE) ×3 IMPLANT
TOWEL OR 17X26 10 PK STRL BLUE (TOWEL DISPOSABLE) ×6 IMPLANT
TRAY FOLEY CATH 16FRSI W/METER (SET/KITS/TRAYS/PACK) IMPLANT
TUBE CONNECTING 12'X1/4 (SUCTIONS) ×1
TUBE CONNECTING 12X1/4 (SUCTIONS) ×2 IMPLANT
WATER STERILE IRR 1000ML POUR (IV SOLUTION) ×6 IMPLANT
YANKAUER SUCT BULB TIP NO VENT (SUCTIONS) ×3 IMPLANT

## 2013-03-08 NOTE — Brief Op Note (Signed)
02/27/2013 - 03/08/2013  3:28 PM  PATIENT:  [name] [name]  43 y.o. male  PRE-OPERATIVE DIAGNOSIS:  1. LEFT Bicondylar TIBIAL PLATEAU  FRACTURE 2. Tibial [place] Fracture 3. Retained ex-fix  POST-OPERATIVE DIAGNOSIS:   1. LEFT Bicondylar TIBIAL PLATEAU  FRACTURE 2. Tibial [place] Fracture 3. Retained ex-fix  PROCEDURE:   1. ORIF OF BICONDYLAR TIBIAL PLATEAU 2. ORIF OF TIBIAL EMINENCE FRACTURE 3. REMOVAL OF EXTERNAL FIXATOR UNDER ANESTHESIA 4. ANTERIOR COMPARTMENT FASCIOTOMY  SURGEON:  Surgeon(s) and Role:    * Rozanna Box, MD - Primary  PHYSICIAN ASSISTANT: Ainsley Spinner, PA-C  ANESTHESIA:   general  I/O:  Total I/O In: 1800 [I.V.:1800] Out: 650 [Urine:400; Blood:250]  SPECIMEN:  No Specimen  TOURNIQUET:   Total Tourniquet Time Documented: Thigh (Left) - 135 minutes Total: Thigh (Left) - 135 minutes   DICTATION: .Other Dictation: Dictation Number [id number]

## 2013-03-08 NOTE — Anesthesia Postprocedure Evaluation (Signed)
  Anesthesia Post-op Note  Patient: [name] [name]  Procedure(s) Performed: Procedure(s): LEFT OPEN REDUCTION INTERNAL FIXATION (ORIF) TIBIA FRACTURE (Left)  Patient Location: PACU  Anesthesia Type:General  Level of Consciousness: awake and alert   Airway and Oxygen Therapy: Patient Spontanous Breathing  Post-op Pain: moderate  Post-op Assessment: Post-op Vital signs reviewed and Patient's Cardiovascular Status Stable  Post-op Vital Signs: stable  Complications: No apparent anesthesia complications

## 2013-03-08 NOTE — Progress Notes (Signed)
Orthopedic Tech Progress Note Patient Details:  [name] [birth year] [id number] Brace completed by [employer] vendor Patient ID: [name], male   DOB: [birth year], 43 y.o.   MRN: [id number]   [name] 03/08/2013, 6:09 PM

## 2013-03-08 NOTE — Progress Notes (Signed)
I saw and examined the patient with Mr. [name] [name], communicating the findings and plan noted above. We can safely proceed with ORIF tomorrow.  Altamese Man, MD [facility] [phone] (p)

## 2013-03-08 NOTE — Progress Notes (Signed)
I have seen and examined the patient. I agree with the findings above.  Rozanna Box, MD 03/08/2013 8:06 AM

## 2013-03-08 NOTE — Preoperative (Signed)
Beta Blockers   Reason not to administer Beta Blockers:Not Applicable 

## 2013-03-08 NOTE — Discharge Summary (Signed)
Agree with above. Discharge delayed at this time. Altamese [place], MD [facility] [phone] (p)

## 2013-03-08 NOTE — Transfer of Care (Signed)
Immediate Anesthesia Transfer of Care Note  Patient: [name] [name]  Procedure(s) Performed: Procedure(s): LEFT OPEN REDUCTION INTERNAL FIXATION (ORIF) TIBIA FRACTURE (Left)  Patient Location: PACU  Anesthesia Type:General  Level of Consciousness: awake, alert  and oriented  Airway & Oxygen Therapy: Patient Spontanous Breathing and Patient connected to nasal cannula oxygen  Post-op Assessment: Report given to PACU RN, Post -op Vital signs reviewed and stable and Patient moving all extremities X 4  Post vital signs: Reviewed and stable  Complications: No apparent anesthesia complications

## 2013-03-08 NOTE — Anesthesia Procedure Notes (Signed)
Procedure Name: Intubation Date/Time: 03/08/2013 11:09 AM Performed by: Carola Frost Pre-anesthesia Checklist: Patient identified, Timeout performed, Emergency Drugs available, Suction available and Patient being monitored Patient Re-evaluated:Patient Re-evaluated prior to inductionOxygen Delivery Method: Circle system utilized Preoxygenation: Pre-oxygenation with 100% oxygen Intubation Type: IV induction Ventilation: Mask ventilation without difficulty Laryngoscope Size: Mac and 4 Grade View: Grade II Tube type: Oral Tube size: 7.5 mm Number of attempts: 1 Airway Equipment and Method: Stylet Placement Confirmation: positive ETCO2,  ETT inserted through vocal cords under direct vision and breath sounds checked- equal and bilateral Secured at: 23 cm Tube secured with: Tape Dental Injury: Teeth and Oropharynx as per pre-operative assessment

## 2013-03-08 NOTE — Progress Notes (Signed)
Orthopedic Tech Progress Note Patient Details:  [name] [name] Jan 05, 1971 [id number] [employer] called for brace order Patient ID: [name] [name], male   DOB: [birth year], 43 y.o.   MRN: [id number]   [name] [name] 03/08/2013, 5:00 PM

## 2013-03-08 NOTE — Anesthesia Preprocedure Evaluation (Addendum)
Anesthesia Evaluation  Patient identified by MRN, date of birth, ID band Patient awake    Reviewed: Allergy & Precautions, H&P , NPO status , Patient's Chart, lab work & pertinent test results  Airway Mallampati: I TM Distance: >3 FB Neck ROM: Full    Dental  (+) Dental Advisory Given   Pulmonary          Cardiovascular Rhythm:Regular Rate:Normal     Neuro/Psych    GI/Hepatic   Endo/Other  Morbid obesity  Renal/GU      Musculoskeletal   Abdominal   Peds  Hematology  (+) anemia ,   Anesthesia Other Findings   Reproductive/Obstetrics                          Anesthesia Physical Anesthesia Plan  ASA: II  Anesthesia Plan: General   Post-op Pain Management:    Induction: Intravenous  Airway Management Planned: Oral ETT  Additional Equipment:   Intra-op Plan:   Post-operative Plan: Extubation in OR  Informed Consent: I have reviewed the patients History and Physical, chart, labs and discussed the procedure including the risks, benefits and alternatives for the proposed anesthesia with the patient or authorized representative who has indicated his/her understanding and acceptance.   Dental advisory given  Plan Discussed with: Surgeon and Anesthesiologist  Anesthesia Plan Comments:         Anesthesia Quick Evaluation

## 2013-03-08 NOTE — Progress Notes (Signed)
OT Cancellation Note  Patient Details Name: [name] [name] MRN: [id number] DOB: 09/06/1970   Cancelled Treatment:     Cancel medical; pt in surgery this date.   Juluis Rainier [phone] 03/08/2013, 3:15 PM

## 2013-03-08 NOTE — Progress Notes (Signed)
I discussed with the patient the risks and benefits of surgery for removal of his fixator and definitive ORIF of his tibial plateau, including the possibility of infection, nerve injury, vessel injury, wound breakdown, arthritis, symptomatic hardware, DVT/ PE, loss of motion, and need for further surgery among others.  We also specifically discussed the elevated risk of soft tissue breakdown that could lead to amputation.  He understood these risks and wished to proceed.  Altamese , MD [facility] [phone] (p)

## 2013-03-09 ENCOUNTER — Inpatient Hospital Stay (HOSPITAL_COMMUNITY): Payer: Worker's Compensation

## 2013-03-09 ENCOUNTER — Encounter (HOSPITAL_COMMUNITY): Payer: Self-pay | Admitting: Orthopedic Surgery

## 2013-03-09 DIAGNOSIS — E349 Endocrine disorder, unspecified: Secondary | ICD-10-CM | POA: Diagnosis present

## 2013-03-09 DIAGNOSIS — I2699 Other pulmonary embolism without acute cor pulmonale: Secondary | ICD-10-CM | POA: Diagnosis not present

## 2013-03-09 DIAGNOSIS — R030 Elevated blood-pressure reading, without diagnosis of hypertension: Secondary | ICD-10-CM

## 2013-03-09 HISTORY — DX: Elevated blood-pressure reading, without diagnosis of hypertension: R03.0

## 2013-03-09 LAB — CBC
HCT: 32.8 % — ABNORMAL LOW (ref 39.0–52.0)
HEMATOCRIT: 32.7 % — AB (ref 39.0–52.0)
HEMOGLOBIN: 11.1 g/dL — AB (ref 13.0–17.0)
Hemoglobin: 11.1 g/dL — ABNORMAL LOW (ref 13.0–17.0)
MCH: 26.6 pg (ref 26.0–34.0)
MCH: 26.7 pg (ref 26.0–34.0)
MCHC: 33.8 g/dL (ref 30.0–36.0)
MCHC: 33.9 g/dL (ref 30.0–36.0)
MCV: 78.7 fL (ref 78.0–100.0)
MCV: 78.8 fL (ref 78.0–100.0)
PLATELETS: 457 10*3/uL — AB (ref 150–400)
Platelets: 442 10*3/uL — ABNORMAL HIGH (ref 150–400)
RBC: 4.17 MIL/uL — AB (ref 4.22–5.81)
RBC: 4.15 MIL/uL — ABNORMAL LOW (ref 4.22–5.81)
RDW: 14.4 % (ref 11.5–15.5)
RDW: 14.2 % (ref 11.5–15.5)
WBC: 13.3 10*3/uL — ABNORMAL HIGH (ref 4.0–10.5)
WBC: 14.8 10*3/uL — AB (ref 4.0–10.5)

## 2013-03-09 LAB — BASIC METABOLIC PANEL
BUN: 13 mg/dL (ref 6–23)
CHLORIDE: 98 meq/L (ref 96–112)
CO2: 25 mEq/L (ref 19–32)
Calcium: 8.7 mg/dL (ref 8.4–10.5)
Creatinine, Ser: 1.12 mg/dL (ref 0.50–1.35)
GFR calc Af Amer: >90 mL/min (ref >90)
GFR calc non Af Amer: 80 mL/min — ABNORMAL LOW (ref >90)
GLUCOSE: 119 mg/dL — AB (ref 70–99)
Potassium: 4.3 mEq/L (ref 3.7–5.3)
Sodium: 136 mEq/L — ABNORMAL LOW (ref 137–147)

## 2013-03-09 LAB — TESTOSTERONE: Testosterone: 105 ng/dL — ABNORMAL LOW (ref 300–890)

## 2013-03-09 LAB — TSH: TSH: 0.867 u[IU]/mL (ref 0.350–4.500)

## 2013-03-09 LAB — TESTOSTERONE, FREE: Testosterone, Free: 31.5 pg/mL — ABNORMAL LOW (ref 47.0–244.0)

## 2013-03-09 LAB — SEX HORMONE BINDING GLOBULIN: Sex Hormone Binding: 11 nmol/L — ABNORMAL LOW (ref 13–71)

## 2013-03-09 LAB — TESTOSTERONE, % FREE: Testosterone-% Free: 3 % — ABNORMAL HIGH (ref 1.6–2.9)

## 2013-03-09 MED ORDER — WARFARIN VIDEO: Freq: Once | Status: DC

## 2013-03-09 MED ORDER — APIXABAN 5 MG PO TABS: 5.0000 mg | ORAL_TABLET | Freq: Two times a day (BID) | ORAL | Status: DC

## 2013-03-09 MED ORDER — PATIENT'S GUIDE TO USING COUMADIN BOOK
Freq: Once | Status: DC
  Filled 2013-03-09: qty 1

## 2013-03-09 MED ORDER — METOPROLOL TARTRATE 25 MG PO TABS
25.0000 mg | ORAL_TABLET | Freq: Two times a day (BID) | ORAL | Status: DC
  Administered 2013-03-09: 25 mg via ORAL
  Filled 2013-03-09 (×2): qty 1

## 2013-03-09 MED ORDER — APIXABAN 5 MG PO TABS
10.0000 mg | ORAL_TABLET | Freq: Two times a day (BID) | ORAL | Status: DC
  Administered 2013-03-09 – 2013-03-12 (×6): 10 mg via ORAL
  Filled 2013-03-09 (×10): qty 2

## 2013-03-09 MED ORDER — IOHEXOL 350 MG/ML SOLN
100.0000 mL | Freq: Once | INTRAVENOUS | Status: AC | PRN
  Administered 2013-03-09: 100 mL via INTRAVENOUS

## 2013-03-09 MED ORDER — HEPARIN BOLUS VIA INFUSION
5000.0000 [IU] | Freq: Once | INTRAVENOUS | Status: DC
  Filled 2013-03-09: qty 5000

## 2013-03-09 MED ORDER — HEPARIN (PORCINE) IN NACL 100-0.45 UNIT/ML-% IJ SOLN
1600.0000 [IU]/h | INTRAMUSCULAR | Status: DC
  Filled 2013-03-09: qty 250

## 2013-03-09 MED ORDER — METOPROLOL TARTRATE 25 MG PO TABS: 25.0000 mg | ORAL_TABLET | Freq: Two times a day (BID) | ORAL | Status: DC

## 2013-03-09 MED ORDER — METOPROLOL TARTRATE 12.5 MG HALF TABLET
12.5000 mg | ORAL_TABLET | Freq: Two times a day (BID) | ORAL | Status: DC
  Administered 2013-03-09 – 2013-03-12 (×6): 12.5 mg via ORAL
  Filled 2013-03-09 (×11): qty 1
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
## 2013-03-09 NOTE — Progress Notes (Addendum)
Rehab admissions - I met with pt and his wife and explained the possibility of inpatient rehab. Informational brochures were given and questions were answered. Pt and his wife are interested in pursuing this to maximize his independence. Discussed case with Worker's Comp representative Kevan Ny [phone] and Richardson Landry gave approval from [facility] for acute inpatient rehab. Received medical clearance from ortho and spoke with Ainsley Spinner, PA. Updated Rn, Ricki Miller, case manager and PT as well. Bed is available today and anticipate pt will be admitted to CIR later today pending progress with PT.  Please call me with any questions. Thanks.  Nanetta Batty, PT Rehabilitation Admissions Coordinator [phone]
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
## 2013-03-09 NOTE — Progress Notes (Signed)
Rehab admissions - Noted today's syncopal event during PT session and rehab MD/PA are aware. Communicated with ortho PA Ainsley Spinner who preferred that pt hold on admission to CIR today and will likely be ready for CIR admit on Monday. Noted that CT chest and further work up pending. Updated pt as well of intended plan to admit on Monday to CIR.  Will check on pt status on Monday and am hopeful to admit pt then to inpatient rehab as he and his wife are highly motivated to regain his independence. Please call me with any questions. Thanks.  Nanetta Batty, PT Rehabilitation Admissions Coordinator [phone]

## 2013-03-09 NOTE — Significant Event (Addendum)
Rapid Response Event Note  Overview: Time Called: 2998 Arrival Time: 1400 Event Type: Neurologic  Initial Focused Assessment: 1400  Per therapist while ambulating patient to bathroom he became dazed and not communicating.  Patient stated that as he was "hopping" to the bathroom he felt dizzy, and things became dark.  Therapist assisted patient to sit on [facility], patient began to respond.  Bp 106/64  HR 122  Then able to move patient to Chair. Upon arrival patient alert and oriented, but diaphoretic and tachypnic, shivering.  Lung sounds clear Heart tones regular BP 132/78  HR 121 RR 40  O2 sat 98%   Interventions: [place] PA notified of event and patient status. PCXR and CBC ordered. BP 143/94  HR 120  O2 sat 98% on RA  RR 36 Patient no longer diaphoretic or shivering Will cont to monitor.  Event Summary: Name of Physician Notified: Verline Lema at 1420  Name of Consulting Physician Notified: Linna Hoff rehab PA at bedside shortly after event at    Outcome: Stayed in room and stabalized  Event End Time: [place]  Raliegh Ip
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
## 2013-03-09 NOTE — Progress Notes (Signed)
Orthopaedic Trauma Service (OTS)  Subjective: 1 Day Post-Op Procedure(s) (LRB): LEFT OPEN REDUCTION INTERNAL FIXATION (ORIF) TIBIA FRACTURE (Left) Patient reports pain as moderate.   Denies numbness, tingling. Eager to go home.  Objective: Current Vitals Blood pressure 129/79, pulse 131, temperature 100.2 F (37.9 C), temperature source Oral, resp. rate 18, height 5' 10"  (1.778 m), weight 230 lb (104.327 kg), SpO2 97.00%. Vital signs in last 24 hours: Temp:  [97.4 F (36.3 C)-100.2 F (37.9 C)] 100.2 F (37.9 C) (03/06 0500) Pulse Rate:  [105-131] 131 (03/06 0500) Resp:  [18-29] 18 (03/06 0500) BP: (122-152)/(72-100) 129/79 mmHg (03/06 0500) SpO2:  [94 %-100 %] 97 % (03/06 0500)  Intake/Output from previous day: 03/05 0701 - 03/06 0700 In: 3090 [P.O.:290; I.V.:2800] Out: 1650 [Urine:1400; Blood:250]  LABS  Recent Labs  03/08/13 0439 03/09/13 0511  HGB 12.1* 11.1*    Recent Labs  03/08/13 0439 03/09/13 0511  WBC 10.6* 13.3*  RBC 4.52 4.17*  HCT 35.5* 32.8*  PLT 519* 457*    Recent Labs  03/08/13 0439 03/09/13 0511  NA 141 136*  K 4.4 4.3  CL 101 98  CO2 25 25  BUN 17 13  CREATININE 1.22 1.12  GLUCOSE 105* 119*  CALCIUM 9.6 8.7    Recent Labs  03/08/13 0439  INR 1.06    Physical Exam  Still tachycardic but denies any chest symptoms or concerns LLE Sens DPN, SPN, TN intact  Motor EHL, ext, flex, evers intact  DP 2+  No drainage  Hinge in place and unlocked to allow full motion  Imaging Dg Knee Left Port  03/08/2013   CLINICAL DATA:  post op  EXAM: PORTABLE LEFT KNEE - 1-2 VIEW  COMPARISON:  DG KNEE 3 VIEWS*L* dated 03/08/2013  FINDINGS: Patient is status post medial and lateral buttress plate fixation of a comminuted tibial plateau and proximal tibial shaft fracture with cannulated screw fixation. The hardware appears intact. There is near-anatomic alignment of the osseous structures. Postsurgical changes identified within the soft tissues.   IMPRESSION: ORIF comminuted tibial plateau fracture with extension into the proximal tibial shaft.   Electronically Signed   By: Margaree Mackintosh M.D.   On: 03/08/2013 16:26   Dg C-arm 61-120 Min  03/08/2013   CLINICAL DATA:  fracture fixation  EXAM: LEFT KNEE - 3 VIEW; DG C-ARM 61-120 MIN  COMPARISON:  03/01/2013  FINDINGS: Lateral tibial plateau fracture has been fixed with a lateral plate and screws. Medial tibial plate and screws also present. Knee joint in satisfactory alignment.  IMPRESSION: Medial and lateral plate fixation of proximal tibial fracture.   Electronically Signed   By: Franchot Gallo M.D.   On: 03/08/2013 15:56   Dg Knee 2 Views Left  03/08/2013   CLINICAL DATA:  fracture fixation  EXAM: LEFT KNEE - 3 VIEW; DG C-ARM 61-120 MIN  COMPARISON:  03/01/2013  FINDINGS: Lateral tibial plateau fracture has been fixed with a lateral plate and screws. Medial tibial plate and screws also present. Knee joint in satisfactory alignment.  IMPRESSION: Medial and lateral plate fixation of proximal tibial fracture.   Electronically Signed   By: Franchot Gallo M.D.   On: 03/08/2013 15:56    Assessment/Plan: 1 Day Post-Op Procedure(s) (LRB): LEFT OPEN REDUCTION INTERNAL FIXATION (ORIF) TIBIA FRACTURE (Left) Low testosterone Tachycardia Low Vit D  1. NWB, unrestricted AROM and PROM of knee in hinge 2. Testosterone 3. Lopressor 4. Lovenox for 20 days 5. F/u in 10 days 6. Vit D 50,000  Altamese Tamaroa, MD [facility] [phone] (p)   03/09/2013, 8:13 AM

## 2013-03-09 NOTE — Progress Notes (Addendum)
Follow up at 1725 Patient returned from CTA chest:  Positive for PE per MD Assisted RN with transfer to ICU. Plan transfer to 3M04 Patient remains tachypnic  BP 130/70  HR 120  O2 sat 98 RR 32

## 2013-03-09 NOTE — Progress Notes (Signed)
Orthopedic Tech Progress Note Patient Details:  [name] [name] 02/05/1970 [id number] Prafo boot applied to Left LE Ortho Devices Type of Ortho Device: Other (comment) Ortho Device/Splint Location: Left Ortho Device/Splint Interventions: Application   Asia R Thompson 03/09/2013, 9:02 AM
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
## 2013-03-09 NOTE — Progress Notes (Signed)
APtietn has been accepted by CIR. Clinical Social Worker will sign off for now as social work intervention is no longer needed. Please consult [place] again if new need arises.   Rhea Pink, MSW, [place]
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
## 2013-03-09 NOTE — Progress Notes (Addendum)
Upon ambulating into bathroom with PT this afternoon, patient began to to feel as if he was in a daze, was sat down upon bedside commode chair, RN was alerted, where he experienced a brief moment of unresponsiveness, as well as had become diaphoretic.  Rapid response called, BP 106/64 HR 122, patient without any chest pain. Patient was transferred to recliner chair, where he appeared to become more alert  Patient began experiencing chills with increased respirations, but also stated he was shaking so badly because of pain. BP 132/78 HR 121 RR 40 and pulse ox 98% RA. Spoke with Ainsley Spinner, PA, new order for STAT CBC and portable CXR. Vitals retaken to be 98.5-122/78-118-22-96% on RA. Results reviewed with Ainsley Spinner, PA, patient to have CT angio of chest.  Call from radiology MD with results of PE present.  Ainsley Spinner aware, new orders for heparin and coumadin consults per pharmacy and for patient to be transferred to [facility]  Report called to Eddie Dibbles, RN on [place].  Patient transferred.

## 2013-03-09 NOTE — Progress Notes (Addendum)
Brief Nutrition Note:  Consult received for low vitamin D and low albumin.  1. Education provided on Vitamin D  2. Recommend once prescription Vitamin D is finished that pt remain on an over the counter vitamin D supplement as pt is unable to consume adequate amounts of Vitamin D.   It is very difficult to consume enough vitamin D PO. Very few foods contain a significant amount of Vitamin D. Pt does not drink milk.   Albumin has a half-life of 21 days and is strongly affected by stress response and inflammatory process, therefore, do not expect to see an improvement in this lab value during acute hospitalization and does not reflect his prior nutritional status.   Per pt he has had no recent weight changes and has a good appetite. Per pt he has crohn's but that it has been under control. Vitamin B12 level pending.   Encouraged continued good intake to support healing and recovery.  Please re-consult as needed.   [place], [place], [place] Pager [phone] After Hours Pager

## 2013-03-09 NOTE — Plan of Care (Signed)
Problem: Food- and Nutrition-Related Knowledge Deficit (NB-1.1) Goal: Nutrition education Formal process to instruct or train a patient/client in a skill or to impart knowledge to help patients/clients voluntarily manage or modify food choices and eating behavior to maintain or improve health. Outcome: Completed/Met Date Met:  03/09/13 Nutrition Education Note  RD consulted for nutrition education regarding low vitamin D level.   RD provided "Osteoporosis" handout from the Academy of [employer] Dietetics. Reviewed patient's dietary recall. Provided examples of vitamin D sources in pt's diet.   Teach back method used.  Expect good compliance.  Body mass index is 33 kg/(m^2). Pt meets criteria for obesity class I based on current BMI.  Current diet order is Heart Healthy, patient is consuming approximately 100% of meals at this time. Labs and medications reviewed. No further nutrition interventions warranted at this time. RD contact information provided. If additional nutrition issues arise, please re-consult RD.   [place], [place], [place] Pager [phone] After Hours Pager

## 2013-03-09 NOTE — Progress Notes (Signed)
[facility] Progress Note  Contacted by RN regarding pt and issues with therapy today Sounds like pt may have had a vasovagal episode related to pain and/or hypovolemia.  Rapid response called and evaluated pt, note pending. However, pt seems to be stable Pt w/o specific complaints per RN Pt continues to be tachycardic and has also been tachypneic  Pt sats are 96% on RA  BP 122/78  Pulse 118  Temp(Src) 98.5 F (36.9 C) (Oral)  Resp 22  Ht 5' 10"  (1.778 m)  Wt 104.327 kg (230 lb)  BMI 33.00 kg/m2  SpO2 96%   Hold dc to CIR until further notice  Recheck CBC CXR Continue with IVF Decrease lopressor to 12.5 mg po bid  CT angio of chest Continue to monitor in acute setting  Continue with therapies  Jari Pigg, PA-C [facility] [phone] (P) 03/09/2013 3:18 PM
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
## 2013-03-10 DIAGNOSIS — I2699 Other pulmonary embolism without acute cor pulmonale: Secondary | ICD-10-CM

## 2013-03-10 DIAGNOSIS — S82209A Unspecified fracture of shaft of unspecified tibia, initial encounter for closed fracture: Secondary | ICD-10-CM

## 2013-03-10 DIAGNOSIS — E559 Vitamin D deficiency, unspecified: Secondary | ICD-10-CM

## 2013-03-10 LAB — VITAMIN B12
Vitamin B-12: 555 pg/mL (ref 211–911)
Vitamin B-12: 611 pg/mL (ref 211–911)

## 2013-03-10 LAB — IRON AND TIBC
IRON: 12 ug/dL — AB (ref 42–135)
Iron: 13 ug/dL — ABNORMAL LOW (ref 42–135)
SATURATION RATIOS: 6 % — AB (ref 20–55)
Saturation Ratios: 5 % — ABNORMAL LOW (ref 20–55)
TIBC: 220 ug/dL (ref 215–435)
TIBC: 221 ug/dL (ref 215–435)
UIBC: 208 ug/dL (ref 125–400)
UIBC: 208 ug/dL (ref 125–400)

## 2013-03-10 LAB — PREALBUMIN
PREALBUMIN: 12 mg/dL — AB (ref 17.0–34.0)
Prealbumin: 11 mg/dL — ABNORMAL LOW (ref 17.0–34.0)

## 2013-03-10 LAB — RETICULOCYTES
RBC.: 3.93 MIL/uL — AB (ref 4.22–5.81)
RBC.: 3.9 MIL/uL — ABNORMAL LOW (ref 4.22–5.81)
RETIC COUNT ABSOLUTE: 47.2 10*3/uL (ref 19.0–186.0)
Retic Count, Absolute: 50.7 10*3/uL (ref 19.0–186.0)
Retic Ct Pct: 1.2 % (ref 0.4–3.1)
Retic Ct Pct: 1.3 % (ref 0.4–3.1)

## 2013-03-10 LAB — FERRITIN
FERRITIN: 776 ng/mL — AB (ref 22–322)
Ferritin: 878 ng/mL — ABNORMAL HIGH (ref 22–322)

## 2013-03-10 LAB — PROLACTIN: PROLACTIN: 11.2 ng/mL (ref 2.1–17.1)

## 2013-03-10 LAB — TESTOSTERONE: Testosterone: 79 ng/dL — ABNORMAL LOW (ref 300–890)

## 2013-03-10 LAB — FOLATE
Folate: 3.7 ng/mL
Folate: 4.7 ng/mL

## 2013-03-10 LAB — LUTEINIZING HORMONE: LH: 4 m[IU]/mL (ref 1.5–9.3)

## 2013-03-10 LAB — FOLLICLE STIMULATING HORMONE: FSH: 1.9 m[IU]/mL (ref 1.4–18.1)
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
## 2013-03-10 NOTE — Progress Notes (Signed)
VASCULAR LAB PRELIMINARY  PRELIMINARY  PRELIMINARY  PRELIMINARY  Bilateral lower extremity venous Dopplers completed.    Preliminary report:  There is no DVT or SVT noted in the visualized veins of the bilateral lower extremities.  Aidric Endicott, RVT 03/10/2013, 5:23 PM

## 2013-03-11 LAB — TESTOSTERONE: Testosterone: 72 ng/dL — ABNORMAL LOW (ref 300–890)

## 2013-03-11 LAB — LUTEINIZING HORMONE: LH: 4.3 m[IU]/mL (ref 1.5–9.3)

## 2013-03-11 LAB — PROLACTIN: PROLACTIN: 7.7 ng/mL (ref 2.1–17.1)
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
## 2013-03-11 NOTE — Discharge Instructions (Addendum)
Orthopaedic Trauma Service Discharge Instructions   General Discharge Instructions  WEIGHT BEARING STATUS: Nonweightbearing   RANGE OF MOTION/ACTIVITY: ROM of left knee and ankle as tolerated. Do not place pillow under Left knee at rest  Wound care: daily dressing changes starting on 03/14/2013. See instructions below   Diet: as you were eating previously.  Can use over the counter stool softeners and bowel preparations, such as Miralax, to help with bowel movements.  Narcotics can be constipating.  Be sure to drink plenty of fluids  STOP SMOKING OR USING NICOTINE PRODUCTS!!!!  As discussed nicotine severely impairs your body's ability to heal surgical and traumatic wounds but also impairs bone healing.  Wounds and bone heal by forming microscopic blood vessels (angiogenesis) and nicotine is a vasoconstrictor (essentially, shrinks blood vessels).  Therefore, if vasoconstriction occurs to these microscopic blood vessels they essentially disappear and are unable to deliver necessary nutrients to the healing tissue.  This is one modifiable factor that you can do to dramatically increase your chances of healing your injury.    (This means no smoking, no nicotine gum, patches, etc)  DO NOT USE NONSTEROIDAL ANTI-INFLAMMATORY DRUGS (NSAID'S)  Using products such as Advil (ibuprofen), Aleve (naproxen), Motrin (ibuprofen) for additional pain control during fracture healing can delay and/or prevent the healing response.  If you would like to take over the counter (OTC) medication, Tylenol (acetaminophen) is ok.  However, some narcotic medications that are given for pain control contain acetaminophen as well. Therefore, you should not exceed more than 4000 mg of tylenol in a day if you do not have liver disease.  Also note that there are may OTC medicines, such as cold medicines and allergy medicines that my contain tylenol as well.  If you have any questions about medications and/or interactions please ask  your doctor/PA or your pharmacist.   PAIN MEDICATION USE AND EXPECTATIONS  You have likely been given narcotic medications to help control your pain.  After a traumatic event that results in an fracture (broken bone) with or without surgery, it is ok to use narcotic pain medications to help control one's pain.  We understand that everyone responds to pain differently and each individual patient will be evaluated on a regular basis for the continued need for narcotic medications. Ideally, narcotic medication use should last no more than 6-8 weeks (coinciding with fracture healing).   As a patient it is your responsibility as well to monitor narcotic medication use and report the amount and frequency you use these medications when you come to your office visit.   We would also advise that if you are using narcotic medications, you should take a dose prior to therapy to maximize you participation.  IF YOU ARE ON NARCOTIC MEDICATIONS IT IS NOT PERMISSIBLE TO OPERATE A MOTOR VEHICLE (MOTORCYCLE/CAR/TRUCK/MOPED) OR HEAVY MACHINERY DO NOT MIX NARCOTICS WITH OTHER CNS (CENTRAL NERVOUS SYSTEM) DEPRESSANTS SUCH AS ALCOHOL       ICE AND ELEVATE INJURED/OPERATIVE EXTREMITY  Using ice and elevating the injured extremity above your heart can help with swelling and pain control.  Icing in a pulsatile fashion, such as 20 minutes on and 20 minutes off, can be followed.    Do not place ice directly on skin. Make sure there is a barrier between to skin and the ice pack.    Using frozen items such as frozen peas works well as the conform nicely to the are that needs to be iced.  USE AN ACE WRAP OR TED  HOSE FOR SWELLING CONTROL  In addition to icing and elevation, Ace wraps or TED hose are used to help limit and resolve swelling.  It is recommended to use Ace wraps or TED hose until you are informed to stop.    When using Ace Wraps start the wrapping distally (farthest away from the body) and wrap proximally (closer  to the body)   Example: If you had surgery on your leg or thing and you do not have a splint on, start the ace wrap at the toes and work your way up to the thigh        If you had surgery on your upper extremity and do not have a splint on, start the ace wrap at your fingers and work your way up to the upper arm  IF YOU ARE IN A SPLINT OR CAST DO NOT [place]   If your splint gets wet for any reason please contact the office immediately. You may shower in your splint or cast as long as you keep it dry.  This can be done by wrapping in a cast cover or garbage back (or similar)  Do Not stick any thing down your splint or cast such as pencils, money, or hangers to try and scratch yourself with.  If you feel itchy take benadryl as prescribed on the bottle for itching  IF YOU ARE IN A CAM BOOT (BLACK BOOT)  You may remove boot periodically. Perform daily dressing changes as noted below.  Wash the liner of the boot regularly and wear a sock when wearing the boot. It is recommended that you sleep in the boot until told otherwise  CALL THE OFFICE WITH ANY QUESTIONS OR CONCERTS: [phone]   Discharge Wound Care Instructions  Do NOT apply any ointments, solutions or lotions to pin sites or surgical wounds.  These prevent needed drainage and even though solutions like hydrogen peroxide kill bacteria, they also damage cells lining the pin sites that help fight infection.  Applying lotions or ointments can keep the wounds moist and can cause them to breakdown and open up as well. This can increase the risk for infection. When in doubt call the office.  Surgical incisions should be dressed daily.  If any drainage is noted, use one layer of adaptic, then gauze, Kerlix, and an ace wrap.  Once the incision is completely dry and without drainage, it may be left open to air out.  Showering may begin 36-48 hours later.  Cleaning gently with soap and water.  Traumatic wounds should be dressed  daily as well.    One layer of adaptic, gauze, Kerlix, then ace wrap.  The adaptic can be discontinued once the draining has ceased    If you have a wet to dry dressing: wet the gauze with saline the squeeze as much saline out so the gauze is moist (not soaking wet), place moistened gauze over wound, then place a dry gauze over the moist one, followed by Kerlix wrap, then ace wrap.  Information on my medicine - ELIQUIS (apixaban)  This medication education was reviewed with me or my healthcare representative as part of my discharge preparation.  The pharmacist that spoke with me during my hospital stay was:  Rikki Spearing, [facility]  Why was Eliquis prescribed for you? Eliquis was prescribed to treat blood clots that may have been found in the veins of your legs (deep vein thrombosis) or in your lungs (pulmonary embolism) and to reduce  the risk of them occurring again.  What do You need to know about Eliquis ? The starting dose is 10 mg (two 5 mg tablets) taken TWICE daily for the FIRST SEVEN (7) DAYS, then  the dose is reduced to ONE 5 mg tablet taken TWICE daily.  Eliquis may be taken with or without food.   Try to take the dose about the same time in the morning and in the evening. If you have difficulty swallowing the tablet whole please discuss with your pharmacist how to take the medication safely.  Take Eliquis exactly as prescribed and DO NOT stop taking Eliquis without talking to the doctor who prescribed the medication.  Stopping may increase your risk of developing a new blood clot.  Refill your prescription before you run out.  After discharge, you should have regular check-up appointments with your healthcare provider that is prescribing your Eliquis.    What do you do if you miss a dose? If a dose of ELIQUIS is not taken at the scheduled time, take it as soon as possible on the same day and twice-daily administration should be resumed. The dose should not be doubled to make  up for a missed dose.  Important Safety Information A possible side effect of Eliquis is bleeding. You should call your healthcare provider right away if you experience any of the following:   Bleeding from an injury or your nose that does not stop.   Unusual colored urine (red or dark brown) or unusual colored stools (red or black).   Unusual bruising for unknown reasons.   A serious fall or if you hit your head (even if there is no bleeding).  Some medicines may interact with Eliquis and might increase your risk of bleeding or clotting while on Eliquis. To help avoid this, consult your healthcare provider or pharmacist prior to using any new prescription or non-prescription medications, including herbals, vitamins, non-steroidal anti-inflammatory drugs (NSAIDs) and supplements.  This website has more information on Eliquis (apixaban): [email or web address].no.

## 2013-03-12 ENCOUNTER — Encounter (HOSPITAL_COMMUNITY): Payer: Self-pay | Admitting: *Deleted

## 2013-03-12 ENCOUNTER — Inpatient Hospital Stay (HOSPITAL_COMMUNITY)
Admission: RE | Admit: 2013-03-12 | Discharge: 2013-03-22 | DRG: 945 | Disposition: A | Discharge status: Home Health | Payer: Worker's Compensation | Source: Intra-hospital | Attending: Physical Medicine & Rehabilitation | Admitting: Physical Medicine & Rehabilitation

## 2013-03-12 DIAGNOSIS — S82209A Unspecified fracture of shaft of unspecified tibia, initial encounter for closed fracture: Secondary | ICD-10-CM

## 2013-03-12 DIAGNOSIS — S82109A Unspecified fracture of upper end of unspecified tibia, initial encounter for closed fracture: Principal | ICD-10-CM

## 2013-03-12 DIAGNOSIS — R55 Syncope and collapse: Secondary | ICD-10-CM | POA: Diagnosis not present

## 2013-03-12 DIAGNOSIS — Z5189 Encounter for other specified aftercare: Principal | ICD-10-CM

## 2013-03-12 DIAGNOSIS — Z803 Family history of malignant neoplasm of breast: Secondary | ICD-10-CM

## 2013-03-12 DIAGNOSIS — K5080 Crohn's disease of both small and large intestine without complications: Secondary | ICD-10-CM | POA: Diagnosis present

## 2013-03-12 DIAGNOSIS — Z7901 Long term (current) use of anticoagulants: Secondary | ICD-10-CM

## 2013-03-12 DIAGNOSIS — D509 Iron deficiency anemia, unspecified: Secondary | ICD-10-CM

## 2013-03-12 DIAGNOSIS — E291 Testicular hypofunction: Secondary | ICD-10-CM

## 2013-03-12 DIAGNOSIS — K5090 Crohn's disease, unspecified, without complications: Secondary | ICD-10-CM

## 2013-03-12 DIAGNOSIS — T1490XA Injury, unspecified, initial encounter: Secondary | ICD-10-CM | POA: Diagnosis present

## 2013-03-12 DIAGNOSIS — S82143A Displaced bicondylar fracture of unspecified tibia, initial encounter for closed fracture: Secondary | ICD-10-CM

## 2013-03-12 DIAGNOSIS — I2699 Other pulmonary embolism without acute cor pulmonale: Secondary | ICD-10-CM

## 2013-03-12 DIAGNOSIS — D638 Anemia in other chronic diseases classified elsewhere: Secondary | ICD-10-CM | POA: Diagnosis present

## 2013-03-12 DIAGNOSIS — E538 Deficiency of other specified B group vitamins: Secondary | ICD-10-CM | POA: Diagnosis present

## 2013-03-12 DIAGNOSIS — D62 Acute posthemorrhagic anemia: Secondary | ICD-10-CM | POA: Diagnosis present

## 2013-03-12 DIAGNOSIS — E559 Vitamin D deficiency, unspecified: Secondary | ICD-10-CM | POA: Diagnosis present

## 2013-03-12 DIAGNOSIS — K5900 Constipation, unspecified: Secondary | ICD-10-CM | POA: Diagnosis present

## 2013-03-12 DIAGNOSIS — R030 Elevated blood-pressure reading, without diagnosis of hypertension: Secondary | ICD-10-CM

## 2013-03-12 DIAGNOSIS — R7989 Other specified abnormal findings of blood chemistry: Secondary | ICD-10-CM

## 2013-03-12 DIAGNOSIS — Z79899 Other long term (current) drug therapy: Secondary | ICD-10-CM

## 2013-03-12 DIAGNOSIS — Z833 Family history of diabetes mellitus: Secondary | ICD-10-CM

## 2013-03-12 LAB — PTH, INTACT AND CALCIUM
Calcium, Total (PTH): 8.5 mg/dL (ref 8.4–10.5)
PTH: 42.5 pg/mL (ref 14.0–72.0)

## 2013-03-12 MED ORDER — POLYETHYLENE GLYCOL 3350 17 G PO PACK
17.0000 g | PACK | Freq: Every day | ORAL | Status: DC
  Administered 2013-03-13 – 2013-03-20 (×7): 17 g via ORAL
  Filled 2013-03-12 (×11): qty 1

## 2013-03-12 MED ORDER — APIXABAN 5 MG PO TABS
10.0000 mg | ORAL_TABLET | Freq: Two times a day (BID) | ORAL | Status: AC
  Administered 2013-03-12 – 2013-03-16 (×9): 10 mg via ORAL
  Filled 2013-03-12 (×9): qty 2

## 2013-03-12 MED ORDER — SENNOSIDES-DOCUSATE SODIUM 8.6-50 MG PO TABS
1.0000 | ORAL_TABLET | Freq: Two times a day (BID) | ORAL | Status: DC
  Administered 2013-03-12 – 2013-03-21 (×18): 1 via ORAL
  Filled 2013-03-12 (×20): qty 1

## 2013-03-12 MED ORDER — APIXABAN 5 MG PO TABS: 5.0000 mg | ORAL_TABLET | Freq: Two times a day (BID) | ORAL | Status: DC

## 2013-03-12 MED ORDER — METHOCARBAMOL 500 MG PO TABS
500.0000 mg | ORAL_TABLET | Freq: Four times a day (QID) | ORAL | Status: DC | PRN
  Administered 2013-03-22: 500 mg via ORAL
  Filled 2013-03-12: qty 1

## 2013-03-12 MED ORDER — ONDANSETRON HCL 4 MG PO TABS: 4.0000 mg | ORAL_TABLET | Freq: Four times a day (QID) | ORAL | Status: DC | PRN

## 2013-03-12 MED ORDER — METOPROLOL TARTRATE 12.5 MG HALF TABLET
12.5000 mg | ORAL_TABLET | Freq: Two times a day (BID) | ORAL | Status: DC
  Administered 2013-03-12 – 2013-03-22 (×20): 12.5 mg via ORAL
  Filled 2013-03-12 (×22): qty 1

## 2013-03-12 MED ORDER — ONDANSETRON HCL 4 MG/2ML IJ SOLN: 4.0000 mg | Freq: Four times a day (QID) | INTRAMUSCULAR | Status: DC | PRN

## 2013-03-12 MED ORDER — APIXABAN 5 MG PO TABS: 10.0000 mg | ORAL_TABLET | Freq: Two times a day (BID) | ORAL | Status: DC

## 2013-03-12 MED ORDER — APIXABAN 5 MG PO TABS
5.0000 mg | ORAL_TABLET | Freq: Two times a day (BID) | ORAL | Status: DC
  Administered 2013-03-17 – 2013-03-22 (×11): 5 mg via ORAL
  Filled 2013-03-12 (×13): qty 1

## 2013-03-12 MED ORDER — OXYCODONE HCL 5 MG PO TABS
5.0000 mg | ORAL_TABLET | ORAL | Status: DC | PRN
  Administered 2013-03-13 – 2013-03-22 (×16): 10 mg via ORAL
  Filled 2013-03-12 (×16): qty 2

## 2013-03-12 MED ORDER — ACETAMINOPHEN 325 MG PO TABS: 325.0000 mg | ORAL_TABLET | Freq: Four times a day (QID) | ORAL | Status: DC | PRN

## 2013-03-12 MED ORDER — METOPROLOL TARTRATE 12.5 MG HALF TABLET: 12.5000 mg | ORAL_TABLET | Freq: Two times a day (BID) | ORAL | Status: DC

## 2013-03-12 MED ORDER — SORBITOL 70 % SOLN
30.0000 mL | Freq: Every day | Status: DC | PRN
  Administered 2013-03-14: 30 mL via ORAL
  Filled 2013-03-12: qty 30

## 2013-03-12 MED ORDER — VITAMIN D (ERGOCALCIFEROL) 1.25 MG (50000 UNIT) PO CAPS
50000.0000 [IU] | ORAL_CAPSULE | ORAL | Status: DC
  Administered 2013-03-16: 50000 [IU] via ORAL
  Filled 2013-03-12: qty 1
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
## 2013-03-12 NOTE — Progress Notes (Signed)
Rehab admissions - Noted pt's medical events over the weekend and finding of PE. We confirmed with ortho and Ainsley Spinner, PA that pt is medically ready for inpatient rehab today. Spoke with patient and paper work signed. Noted updated PT progress note and pt's activity. Updated RN and case manager that pt will be admitted to inpatient rehab later today.  Please call me with any questions. Thanks.  Nanetta Batty, PT Rehabilitation Admissions Coordinator [phone]

## 2013-03-12 NOTE — Progress Notes (Signed)
Patient and wife oriented to room and unit; all questions answered.  Vitals stable.  Bledsoe brace intact to LLE; dressing intact underneath brace.  Per Marlowe Shores, PA, RN is not to change dressing at this time.  Will continue to monitor.

## 2013-03-13 ENCOUNTER — Inpatient Hospital Stay (HOSPITAL_COMMUNITY): Payer: Self-pay | Admitting: Occupational Therapy

## 2013-03-13 ENCOUNTER — Inpatient Hospital Stay (HOSPITAL_COMMUNITY): Payer: Self-pay | Admitting: Rehabilitation

## 2013-03-13 ENCOUNTER — Inpatient Hospital Stay (HOSPITAL_COMMUNITY): Payer: Self-pay

## 2013-03-13 DIAGNOSIS — S82209A Unspecified fracture of shaft of unspecified tibia, initial encounter for closed fracture: Secondary | ICD-10-CM

## 2013-03-13 LAB — CBC WITH DIFFERENTIAL/PLATELET
Basophils Absolute: 0 10*3/uL (ref 0.0–0.1)
Basophils Relative: 0 % (ref 0–1)
EOS ABS: 0.1 10*3/uL (ref 0.0–0.7)
Eosinophils Relative: 1 % (ref 0–5)
HCT: 30 % — ABNORMAL LOW (ref 39.0–52.0)
Hemoglobin: 10 g/dL — ABNORMAL LOW (ref 13.0–17.0)
Lymphocytes Relative: 16 % (ref 12–46)
Lymphs Abs: 1.5 10*3/uL (ref 0.7–4.0)
MCH: 26 pg (ref 26.0–34.0)
MCHC: 33.3 g/dL (ref 30.0–36.0)
MCV: 78.1 fL (ref 78.0–100.0)
Monocytes Absolute: 0.6 10*3/uL (ref 0.1–1.0)
Monocytes Relative: 6 % (ref 3–12)
NEUTROS PCT: 76 % (ref 43–77)
Neutro Abs: 7.1 10*3/uL (ref 1.7–7.7)
PLATELETS: 599 10*3/uL — AB (ref 150–400)
RBC: 3.84 MIL/uL — AB (ref 4.22–5.81)
RDW: 14 % (ref 11.5–15.5)
WBC: 9.3 10*3/uL (ref 4.0–10.5)

## 2013-03-13 LAB — COMPREHENSIVE METABOLIC PANEL
ALK PHOS: 97 U/L (ref 39–117)
ALT: 94 U/L — AB (ref 0–53)
AST: 44 U/L — ABNORMAL HIGH (ref 0–37)
Albumin: 2.4 g/dL — ABNORMAL LOW (ref 3.5–5.2)
BUN: 16 mg/dL (ref 6–23)
CALCIUM: 9.5 mg/dL (ref 8.4–10.5)
CO2: 28 mEq/L (ref 19–32)
Chloride: 97 mEq/L (ref 96–112)
Creatinine, Ser: 1.14 mg/dL (ref 0.50–1.35)
GFR calc Af Amer: >90 mL/min (ref >90)
GFR calc non Af Amer: 78 mL/min — ABNORMAL LOW (ref >90)
Glucose, Bld: 103 mg/dL — ABNORMAL HIGH (ref 70–99)
POTASSIUM: 4.3 meq/L (ref 3.7–5.3)
SODIUM: 137 meq/L (ref 137–147)
TOTAL PROTEIN: 6.9 g/dL (ref 6.0–8.3)
Total Bilirubin: 0.7 mg/dL (ref 0.3–1.2)

## 2013-03-13 LAB — GLUCOSE, CAPILLARY: Glucose-Capillary: 126 mg/dL — ABNORMAL HIGH (ref 70–99)

## 2013-03-13 NOTE — Progress Notes (Signed)
Physical Therapy Session Note  Patient Details  Name: [name] [name] MRN: [id number] Date of Birth: 20-Mar-1970  Today's Date: 03/13/2013 Time: 1517-1600 Time Calculation (min): 43 min  Short Term Goals: Week 1:  PT Short Term Goal 1 (Week 1): =LTG  Skilled Therapeutic Interventions/Progress Updates:   Pt received lying in bed this afternoon, agreeable to therapy.  Wife present during session.  Performed bed mobility at min assist level to control and assist LLE into/out of bed.  Pt also using leg lifter to self assist.  Performed stand pivot transfer with RW at mod assist (more max assist from w/c due to lower surface).  Provided tactile assist at LLE to ensure NWB during mobility.  Assisted pt down to therapy gym and performed 5 reps of sit <> stand with mat at 23" surface with goals of eventually decreasing height to better simulate home bed height.  Performed at [facility] assist level with cues for correct foot positioning on RLE to increase leverage when standing.  Also cues for continued breathing.  Note no dizziness or light headedness during session.  Also performed standing mini squats on RLE x 5 reps, seated push ups using yoga blocks x 5 reps with cues for increased lat activation.  Ended session with supine hip abd (AAROM) x 10 reps and knee presses x 10 reps.  Pt returned to w/c and to bed as stated above.  Left pt in bed with all needs in reach.    Therapy Documentation Precautions:  Precautions Precautions: Fall Required Braces or Orthoses: Other Brace/Splint Knee Immobilizer - Left: On at all times Other Brace/Splint: bledsoe brace Restrictions Weight Bearing Restrictions: Yes LLE Weight Bearing: Non weight bearing General:   Vital Signs: Therapy Vitals Temp: 98.6 F (37 C) Temp src: Oral Pulse Rate: 114 Resp: 22 BP: 118/82 mmHg Patient Position, if appropriate: Lying Oxygen Therapy SpO2: 100 % O2 Device: None (Room air) Pain: pt with mild c/o pain, continues to do  more "anticpating" of pain, rather than real pain.     See FIM for current functional status  Therapy/Group: Individual Therapy  Denice Bors 03/13/2013, 4:30 PM

## 2013-03-13 NOTE — Progress Notes (Signed)
Patient's last bowel movement was 03/10/13; patient denies any discomfort.  Offered patient a laxative but patient refused.  Explained to patient the importance of bowel function; patient verbalizes understanding.  Patient agrees to take a laxative tomorrow if he does not have a bowel movement.  Will continue to monitor.
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
## 2013-03-13 NOTE — Progress Notes (Signed)
Inpatient Rehabilitation Center Individual Statement of Services  Patient Name:  [name] [name]  Date:  03/13/2013  Welcome to the [facility].  Our goal is to provide you with an individualized program based on your diagnosis and situation, designed to meet your specific needs.  With this comprehensive rehabilitation program, you will be expected to participate in at least 3 hours of rehabilitation therapies Monday-Friday, with modified therapy programming on the weekends.  Your rehabilitation program will include the following services:  Physical Therapy (PT), Occupational Therapy (OT), 24 hour per day rehabilitation nursing, Therapeutic Recreation (TR), Case Management (Social Worker), Rehabilitation Medicine, Nutrition Services and Pharmacy Services  Weekly team conferences will be held on Tuesdays to discuss your progress.  Your Social Worker will talk with you frequently to get your input and to update you on team discussions.  Team conferences with you and your family in attendance may also be held.  Expected length of stay: 7 to 10 days  Overall anticipated outcome: Modified Independent and Supervision for ambulation, bathing, and car transfers  Depending on your progress and recovery, your program may change. Your Social Worker will coordinate services and will keep you informed of any changes. Your Social Worker's name and contact numbers are listed  below.  The following services may also be recommended but are not provided by the [facility] will be made to provide these services after discharge if needed.  Arrangements include referral to agencies that provide these services.  Your insurance has been verified to be:  [employer] researcher primary doctor is:  Dr. Cooper Render  Pertinent  information will be shared with your doctor and your insurance company.  Social Worker:  Alfonse Alpers, LCSW  [phone] or ([phone]  Information discussed with and copy given to patient by: Trey Sailors, 03/13/2013, 12:27 PM
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
## 2013-03-13 NOTE — Evaluation (Signed)
Reviewed and in agreement with evaluation and treatment provided.

## 2013-03-13 NOTE — Progress Notes (Signed)
Patient information reviewed and entered into e[facility] system by Nobie Alleyne, RN, CRRN, PPS Coordinator.  Information including medical coding and functional independence measure will be reviewed and updated through discharge.    
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
## 2013-03-14 ENCOUNTER — Inpatient Hospital Stay (HOSPITAL_COMMUNITY): Payer: Self-pay | Admitting: Physical Therapy

## 2013-03-14 ENCOUNTER — Inpatient Hospital Stay (HOSPITAL_COMMUNITY): Payer: Self-pay | Admitting: Occupational Therapy

## 2013-03-14 ENCOUNTER — Inpatient Hospital Stay (HOSPITAL_COMMUNITY): Payer: Self-pay

## 2013-03-14 ENCOUNTER — Encounter (HOSPITAL_COMMUNITY): Payer: Self-pay | Admitting: Occupational Therapy

## 2013-03-14 DIAGNOSIS — S82209A Unspecified fracture of shaft of unspecified tibia, initial encounter for closed fracture: Secondary | ICD-10-CM

## 2013-03-14 MED ORDER — HYDROCERIN EX CREA
TOPICAL_CREAM | Freq: Two times a day (BID) | CUTANEOUS | Status: DC
  Administered 2013-03-14 – 2013-03-17 (×6): via TOPICAL
  Administered 2013-03-17: 1 via TOPICAL
  Administered 2013-03-18 – 2013-03-22 (×9): via TOPICAL
  Filled 2013-03-14: qty 113
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
## 2013-03-14 NOTE — Progress Notes (Signed)
Physical Therapy Session Note  Patient Details  Name: [name] [name] MRN: [id number] Date of Birth: [birth year]  Today's Date: 03/14/2013 Time: 4193-7902 Time Calculation (min): 60 min  Short Term Goals: Week 1:  PT Short Term Goal 1 (Week 1): =LTG  Skilled Therapeutic Interventions/Progress Updates:    Session consisted of pt performing stand pivot transfers with S-Min A and the use of a RW (Min A required on 2 occasions in order to help pt guide his descent when sitting down on the mat with pt also needing constant verbal cueing in order to follow weightbearing precautions), pt performing w/c propulsion from his room to the gym with S (pt presenting with slow propulsion speed with cueing needed for turns) in order to improve functional independence, pt climbing and descending 3 steps (7") with Min-Max A (Max A required when pt descending steps due to pt c/o light headedness and sudden weakness in bilateral UE's and and RLE), and an activity where pt performed sit to stand transfers with Min guard and the use of a RW and then performed hand taps in standing with the use of unilateral UE support with Min guard (pt RLE began to fatigue when performing this activity but pt presented with good UE intralimb coordination throughout) in order to improve pt's dynamic standing balance. Pt took long rest breaks throughout the session and pt vitals were immediately checked after performing stairs due to pt c/o light headedness, pt weakness, and pt sweating perfusely with vitals also constantly being monitored throughout the session. Vital results are as follows: BP=91/60, SPO2=100%, and HR=137 in sitting after performing stairs; BP= 116/78, HR=126, and SPO2=100% in sitting 10 minutes after finishing stairs; BP= 123/72, SPO2=100%, and HR=142 in sitting immediately after performing sit to stand transfers; BP=122/82, SPO2=99%, and HR=122 at the conclusion of the session with pt sitting up in the recliner. RN was  made aware of this during the session and checked on the pt after the session concluded.   Therapy Documentation Precautions:  Precautions Precautions: Fall Required Braces or Orthoses: Other Brace/Splint Knee Immobilizer - Left: On at all times Other Brace/Splint: bledsoe brace Restrictions Weight Bearing Restrictions: Yes LLE Weight Bearing: Non weight bearing Pain: Pt c/o 2/10 LLE pain throughout the session. Pt was premedicated for pain and took rest breaks throughout in order to alleviate this pain.   See FIM for current functional status  Therapy/Group: Individual Therapy  [name] Stair 03/14/2013, 12:34 PM

## 2013-03-14 NOTE — Progress Notes (Signed)
Reviewed and in agreement with treatment provided.  

## 2013-03-14 NOTE — Progress Notes (Signed)
Physical Therapy Session Note  Patient Details  Name: MENACHEM URBANEK MRN: [id number] Date of Birth: Jan 01, 1971  Today's Date: 03/14/2013 Time: 1135-1203 Time Calculation (min): 28 min  Short Term Goals: Week 1:  PT Short Term Goal 1 (Week 1): =LTG  Skilled Therapeutic Interventions/Progress Updates:    Donned abdominal binder per new orders. Session focused on slow progression of activity. Sit <> stands from recliner and w/c performed with min assist, cues for UE/LE positioning for pain modulation. Standing tolerance and forwards/backwards hopping (pre-gait) with RW and min-guard assist no c/o lightheadedness however BP did drop (see vitals below). Stand pivot transfers performed at min assist cues for sequencing. Pain 4/10 Lt LE - RN aware.  Sitting Pre- Activity: BP 122/81; SpO2 99%; HR 105 Sitting Post Standing Activity: BP 97/65; SpO2 99%; HR 118  Therapy Documentation Precautions:  Precautions Precautions: Fall Required Braces or Orthoses: Other Brace/Splint Knee Immobilizer - Left: On at all times Other Brace/Splint: bledsoe brace Restrictions Weight Bearing Restrictions: Yes LLE Weight Bearing: Non weight bearing  See FIM for current functional status  Therapy/Group: Individual Therapy  Lahoma Rocker 03/14/2013, 12:23 PM
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
## 2013-03-14 NOTE — Progress Notes (Signed)
Orthopedic Tech Progress Note Patient Details:  [name] [name] 1970-04-05 [id number]  Ortho Devices Type of Ortho Device: Abdominal binder Ortho Device/Splint Interventions: Ordered   [name] [name] 03/14/2013, 11:21 AM

## 2013-03-14 NOTE — Progress Notes (Signed)
Pt in gym with PT, became dizzy, light headed and unresponsive for few seconds,diaphoric,cold,clammy, pt placed on mat, BP 105/70 heart rate 117, CBG 126. Marlowe Shores, Pa in to see pt, pt placed in wheelchair and returned to room , placed in bed, orders received for thigh high ted hose on Right leg, pt reports resolution on symptoms once in bed. Roberts-VonCannon, Taino Maertens Selinda Eon

## 2013-03-14 NOTE — Progress Notes (Signed)
Occupational Therapy Session Notes  Patient Details  Name: [name] [name] MRN: [id number] Date of Birth: 1970/12/07  Today's Date: 03/14/2013  Short Term Goals: Week 1:  OT Short Term Goal 1 (Week 1): Short Term Goals = Long Term Goals  Skilled Therapeutic Interventions/Progress Updates:   Session #1 [phone] - 49 Minutes Individual Therapy Patient with 4/10 complaints of pain in right LE, RN aware Upon entering room, patient found supine in bed. Patient engaged in bed mobility using leg lifter with minimal assistance. Patient sat EOB for UB bathing & dressing tasks and stood (with minimal assistance) for peri cleansing. Therapist introduced reacher and long handled sponge in order to increase independence with LB ADLs. Patient transferred EOB>w/c with steady assist (squat pivot technique). Patient anxious this am, therapist encouraged rest breaks prn and pursed lip breathing. Patient's wife present during entire session. Therapist left patient seated in w/c with LLE elevated and all needed items within reach.   Session #2 [id number] - 46 Minutes Individual Therapy Patient with request for pain medication, RN made aware Upon entering room, patient found seated in recliner. Therapist assisted with managing of LLE and lowering > ground. BP=113/79 & HR=111 From here, patient transferred > right(stronger)side with steady assist. BP=116/80 & HR=116 Patient with urine urgency and used urinal. From here, patient propelled self > therapy gym, therapist educated patient on w/c pushups and then patient transferred onto therapy mat>right side. Patient performed lateral scoots right<>left for focus on patient managing leg, weight shifting, BUE strengthening, and overall activity tolerance/endurance. Patient transferred back to w/c > left side (transferring to weaker side was a little more difficult). Patient propelled self back to room and transferred back to recliner with close supervision. Patient  left seated in recliner with BLEs elevated and all needed items within reach.   Precautions:  Precautions Precautions: Fall Required Braces or Orthoses: Other Brace/Splint Knee Immobilizer - Left: On at all times Other Brace/Splint: bledsoe brace Restrictions Weight Bearing Restrictions: Yes LLE Weight Bearing: Non weight bearing  See FIM for current functional status  Erynne Kealey 03/14/2013, 7:25 AM

## 2013-03-14 NOTE — Progress Notes (Signed)
Notified Marlowe Shores, PA that physical therapist Ebony Hail reported that patient became light headed, dizzy, unresponsive/ blank stare to verbal command for few seconds, shorter than episode on 03/13/13. Also oxygen saturation down into 80's back up to 100 with rest, heart rate up to 140's blood pressure down to 90's over 50-60's. Pt back to room placed in recliner, recheck blood pressure 120/64 heart rate 120, lopressor 12.5 mg po given, order received for abdominal binder when OOB. Notified orthopedic tech of need for binder placed on patient in recliner. Roberts-VonCannon, Khaliyah [employer] Selinda Eon

## 2013-03-15 ENCOUNTER — Inpatient Hospital Stay (HOSPITAL_COMMUNITY): Payer: Self-pay | Admitting: Rehabilitation

## 2013-03-15 ENCOUNTER — Encounter (HOSPITAL_COMMUNITY): Payer: Self-pay | Admitting: Occupational Therapy

## 2013-03-15 ENCOUNTER — Inpatient Hospital Stay (HOSPITAL_COMMUNITY): Payer: Self-pay | Admitting: Occupational Therapy

## 2013-03-15 ENCOUNTER — Inpatient Hospital Stay (HOSPITAL_COMMUNITY): Payer: Self-pay
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
## 2013-03-15 NOTE — Progress Notes (Signed)
Social Work Patient ID: [name] [name], male   DOB: [birth year], 43 y.o.   MRN: [id number]  CSW met with pt to update him on team conference discussion.  Told pt that team feels he will be at a modified independent w/c level by targeted d/c of 03-22-13.  They expect that pt will need supervision for gait and min assist for stairs.  Pt agreed with the proposed levels of care and stated again that his wife or father will assist with gait and stairs, but he will use the w/c independently when they are not present.  Pt reports feeling better and feeling less anxious.  He thinks his wife is more comfortable with how he is doing, too.  Pt did report a concern to CSW with his penis while he was urinating prior to therapy.  CSW passed this information on to pt's RN, Selinda Eon, and she followed up with pt and he felt more comfortable after talking to her about it.  Pt is pleased to be going home at the end of the week next week.  He will pass the above information on to his wife.  CSW will continue to follow pt and assist as needed.

## 2013-03-15 NOTE — Plan of Care (Signed)
Problem: RH BOWEL ELIMINATION Goal: RH STG MANAGE BOWEL WITH ASSISTANCE STG Manage Bowel with Modified independence Assistance.  Outcome: Not Progressing LBM 3/7. Per report declining additional laxatives (beyond those scheduled) until today, when sorbitol was given. Awaiting results.

## 2013-03-15 NOTE — Progress Notes (Signed)
Reviewed and in agreement with treatment provided.  

## 2013-03-15 NOTE — Progress Notes (Signed)
Physical Therapy Session Note  Patient Details  Name: [name] [name] MRN: [id number] Date of Birth: 18-May-1970  Today's Date: 03/15/2013 Time: 0830-0928 Time Calculation (min): 58 min  Short Term Goals: Week 1:  PT Short Term Goal 1 (Week 1): =LTG  Skilled Therapeutic Interventions/Progress Updates:    Session consisted pt performing squat pivot transfers with S (pt needing cueing for proper hand placement when performing this transfer and in order to use the leg lifter to manage his LLE in order to don and doff leg rests with pt needing less cueing this morning to not bear weight through his LLE when performing this transfer), pt performing stand pivot transfers with Min guard and the use of a RW (pt needing cueing for proper hand placement and for proper RW sequencing when performing this transfer), and performing bed mobility with S-Min A with the use of a leg lifter (pt showed good LLE management with the leg lifter with Min A needed for sit to supine to help guide the pt's back down to the mat), and pt performing w/c propulsion from from his room to the rehab gym with Mod I (presenting with slow propulsion speed) in order to improve functional independence). Session concluded with pt performing sit to stands while also performing a reaching task with Min guard and the use of a RW in order to improve pt's safety at home (pt showing improved standing tolerance during this mornings session) and pt performing gait x 20' with Min A and the use of a RW (verbal cueing needed throughout for proper RW sequencing and to adhere to weight bearing precautions). Pt had no c/o of light headedness of dizziness throughout the session but vitals were still monitored throughout the session with the following results: Before session in sitting BP 127/86, SPO2 99%, and HR 121; immediately after w/c propulsion in sitting BP 122/82, HR 116, SPO2 100%, immediately after sit to stands in sitting BP 136/83,SPO298%, HR  130; immediately after gait in sitting BP109/64, HR 133, SPO2 100%; 3 minutes after gait in sitting BP 126/81, SPO2 100%, HR 126. All vital information was relayed to the RN after the session was completed.   Therapy Documentation Precautions:  Precautions Precautions: Fall Required Braces or Orthoses: Other Brace/Splint Knee Immobilizer - Left: On at all times Other Brace/Splint: bledsoe brace Restrictions Weight Bearing Restrictions: Yes LLE Weight Bearing: Non weight bearing Pain: Pt was premedicated for pain by the RN and had no c/o pain during the session.   See FIM for current functional status  Therapy/Group: Individual Therapy  Gerod Caligiuri 03/15/2013, 10:17 AM

## 2013-03-15 NOTE — Progress Notes (Signed)
Occupational Therapy Session Notes  Patient Details  Name: [name] [name] MRN: [id number] Date of Birth: [birth year]  Today's Date: 03/15/2013  Short Term Goals: Week 1:  OT Short Term Goal 1 (Week 1): Short Term Goals = Long Term Goals  Skilled Therapeutic Interventions/Progress Updates:   Session #1 [phone] - 43 Minutes Individual Therapy No complaints of pain Upon entering room, patient found supine in bed. Patient with request to use bathroom and willing to get up to toilet. Patient engaged in bed mobility with supervision using leg lifter, transferred EOB>w/c with supervision. Therapist then propelled patient into bathroom and patient performed toilet transfer onto [facility] seated over toilet seat with min assist (transferring to weaker side). Patient required mod assist for toileting, needing assistance with getting pants down off LE brace. RN notified of BM. Patient transferred back to w/c with supervision (going to stronger side).  From here, therapist propelled patient > sink side for UB/LB bathing & dressing tasks. Patient performed bathing & dressing in sit<>stand position with supervision from therapist. Patient able to adhere to NWB status > LLE. During w/c mobility, patient would manage LLE with leg lifter. At end of session, left patient seated in w/c beside bed with all needed items within reach.   Session #2 [id number] - 29 Minutes Individual Therapy Patient with 4/10 complaints of pain, RN made aware Upon entering room, patient found seated in recliner. Patient stated he has been sitting in recliner since 0930. During management of LLE patient with increased pain, possibly due to stiffness. Patient transferred recliner>w/c with supervision. Patient propelled self from room > ADL apartment and therapist introduced and educated patient on tub transfer bench. Therapist demonstrated how to transfer on/off bench. Due to patient's LLE brace, patient unable to transfer & shower at this  time. Patient propelled self > therapy gym and transferred onto mat at supervision, therapist provided min verbal cues for safety with w/c set-up prior to transfer. Patient laid in supine position and completed 2 sets of 10 BLE abduction/adduction exercises(using left lifter), taking break in between sets. Completed exercises in order to increase patient's ability to manage LLE for ADL tasks. Patient sat EOB, completed 1 set of 10 leg lifts (also using leg lifter). Patient then transferred back to w/c and therapist propelled patient back to room. At end of session, left patient seated in recliner with BLEs elevated and all needed items within reach. Therapist encouraged patient to complete BLE exercises with leg lifter while seated in recliner & in between therapy sessions.   Precautions:  Precautions Precautions: Fall Required Braces or Orthoses: Other Brace/Splint Knee Immobilizer - Left: On at all times Other Brace/Splint: bledsoe brace Restrictions Weight Bearing Restrictions: Yes LLE Weight Bearing: Non weight bearing  See FIM for current functional status  [place] 03/15/2013, 6:22 AM

## 2013-03-15 NOTE — Progress Notes (Signed)
Physical Therapy Session Note  Patient Details  Name: [name] [name] MRN: [id number] Date of Birth: 1970-09-06  Today's Date: 03/15/2013 Time: 1519-1600 Time Calculation (min): 41 min  Short Term Goals: Week 1:  PT Short Term Goal 1 (Week 1): =LTG  Skilled Therapeutic Interventions/Progress Updates:   Pt received sitting in recliner, agreeable to therapy session.  Session focused on functional ambulation in ADL apt to better simulate home environment, bed mobility in ADL and stair negotiation for safe D/C home.  Performed squat pivot transfer recliner to w/c with arm rests removed.  Provided min assist for LLE to ensure NWB during transfer.  Pt self propelled w/c using BUEs to just outside of doorway of ADL apt.  Performed approx 15' gait x 2 reps with RW at min assist with min cues for NWB (pt tends to demonstrate TDWB).  Also note his transfers (sit <> stand) are improved and require mod assist, even from lower surfaces.  Performed bed mobility at supervision level with use of leg lifter to self assist LLE into and out of bed.  Also performed sit <> stand from bed at mod assist.  Pt ambulated back to w/c and was assisted to therapy gym.  Consulted with primary PTs to determine plan for stairs to enter home.  Agree that ascending single step/curb backwards best course, therefore practiced single step.  Pt performed at min assist level with min verbal cues and demonstration for correct technique.  Also discussed possible options for negotiating next two steps to get to apt at home.  Will continue to practice.  Pt assisted back to room and returned to recliner as stated above.  Pt left with all needs in reach.    Therapy Documentation Precautions:  Precautions Precautions: Fall Required Braces or Orthoses: Other Brace/Splint Knee Immobilizer - Left: On at all times Other Brace/Splint: bledsoe brace Restrictions Weight Bearing Restrictions: Yes LLE Weight Bearing: Non weight bearing    Pain: Pt with minimal c/o pain as he had gotten pain meds prior to session.     See FIM for current functional status  Therapy/Group: Individual Therapy  Denice Bors 03/15/2013, 7:40 PM

## 2013-03-16 ENCOUNTER — Inpatient Hospital Stay (HOSPITAL_COMMUNITY): Payer: Worker's Compensation | Admitting: *Deleted

## 2013-03-16 ENCOUNTER — Encounter (HOSPITAL_COMMUNITY): Payer: Self-pay | Admitting: Occupational Therapy

## 2013-03-16 ENCOUNTER — Inpatient Hospital Stay (HOSPITAL_COMMUNITY): Payer: Self-pay

## 2013-03-16 DIAGNOSIS — S82209A Unspecified fracture of shaft of unspecified tibia, initial encounter for closed fracture: Secondary | ICD-10-CM

## 2013-03-16 NOTE — Progress Notes (Signed)
Occupational Therapy Session Note  Patient Details  Name: [name] [name] MRN: [id number] Date of Birth: Jan 16, 1970  Today's Date: 03/16/2013 Time: 0905-1007 Time Calculation (min): 62 min  Short Term Goals: Week 1:  OT Short Term Goal 1 (Week 1): Short Term Goals = Long Term Goals  Skilled Therapeutic Interventions/Progress Updates:    Patient seen this am for OT session to address increased independence with basic self care skills.  Patient in bed upon arrival, requesting to use bathroom.  Patient ambulated with RW to bathroom for continent void.  Patient required elevated bed height for first stand of the am, but subsequent stands from varying heights were close supervision and increased time.  Patient required some verbal cueing and demonstration cues for wheelchair safety, e.g. Locking brakes when leaning forward in sitting to avoid tipping chair.  Patient utilized long handled sponge to clean lower legs, reacher to help doff right sock, and sock aide to don right sock.  Patient able to stand at sink for several minutes this am with and without UE support in both static and dynamic conditions for lower body bathing and dressing.    Therapy Documentation Precautions:  Precautions Precautions: Fall Required Braces or Orthoses: Other Brace/Splint Knee Immobilizer - Left: On at all times Other Brace/Splint: bledsoe brace Restrictions Weight Bearing Restrictions: Yes LLE Weight Bearing: Non weight bearing   Pain: 2/10 at rest Increased to 3-4 with activity.  Patient not requiring intervention beyond rest breaks and repositioning.  See FIM for current functional status  Therapy/Group: Individual Therapy  Mariah Milling 03/16/2013, 10:30 AM

## 2013-03-16 NOTE — Progress Notes (Signed)
Occupational Therapy Note  Patient Details  Name: TEANDRE HAMRE MRN: [id number] Date of Birth: 09-Oct-1970 Today's Date: 03/16/2013  Time: 1300-1330 Pt c/o 3/10 pain in LLE; RN aware and repositioned Individual Therapy  Pt resting in bed upon arrival.  Pt used leg lifter to assist with moving LLE when sitting EOB and transferred to w/c with supervision.  Pt rolled to therapy gym to practice transfer to surface simulating height of bed at home.  Pt transitioned to to dynamic standing tasks using RUE to perform tasks while maintaining NWBing on LLE.  Pt completed all tasks with supervision.   Leotis Shames [facility] 03/16/2013, 2:35 PM

## 2013-03-16 NOTE — Progress Notes (Addendum)
OccupationalTherapy Note  Patient Details  Name: [name] [name] MRN: [id number] Date of Birth: Jun 10, 1970 Today's Date: 03/16/2013  Time:1515-1600  (51 min) Pain:none Individual session  Engaged in wc mobility, transfer from recliner to wc.  Propelled self to gym.   Pt used scifit for 10 minutes on 10 workload for 10 minutes at 30 RPM and then 5 minutes counterclockwise at 10 wkload at 29 RPM.  Pt propelled self back to room.   Transferred back to bed with min assist and positioned in bed with pillow under leg and call bell in place.       Lisa Roca 03/16/2013, 3:43 PM
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
## 2013-03-16 NOTE — Progress Notes (Signed)
Physical Therapy Session Note  Patient Details  Name: [name] [name] MRN: [id number] Date of Birth: Sep 11, 1970  Today's Date: 03/16/2013 Time: 1224-4975 Time Calculation (min): 54 min  Short Term Goals: Week 1:  PT Short Term Goal 1 (Week 1): =LTG  Skilled Therapeutic Interventions/Progress Updates:    Patient received sitting in recliner. See below for vitals. Session focused on functional transfers, wheelchair mobility, stair negotiation, and LE strengthening. Patient transfers recliner>wheelchair via stand pivot with RW and supervision. Wheelchair mobility >150' x2 in controlled environment with B UE and mod I. Discussion about STE apartment and method of placing chair inside doorway so that patient only has to negotiate one step then sit on chair inside door. Patient negotiated 2 stairs with RW and minA by ascending backwards then sitting in chair placed on second step, patient descends forwards with RW and minA. Squat pivot transfer wheelchair<>mat with supervision, patient demonstrates difficulty with positioning of wheelchair for transfer and with wheelchair parts management. Sit<>supine with supervision and leg lifter for L LE management. In supine, B ankle pumps, R LE SLR, R LE heels slides, R LE bridges x20; L LE AAROM SLR with leg lifter x5. Patient returned to room and left seated in recliner with all needs within reach.  Therapy Documentation Precautions:  Precautions Precautions: Fall Required Braces or Orthoses: Other Brace/Splint Knee Immobilizer - Left: On at all times Other Brace/Splint: bledsoe brace Restrictions Weight Bearing Restrictions: Yes LLE Weight Bearing: Non weight bearing Vital Signs: Therapy Vitals Temp: 97.4 F (36.3 C) Temp src: Oral Pulse Rate: 116 Resp: 16 BP: 127/71 mmHg Patient Position, if appropriate: Sitting Oxygen Therapy SpO2: 100 % O2 Device: None (Room air) Pulse Oximetry Type: Intermittent Pain: Pain Assessment Pain Assessment:  0-10 Pain Score: 3  Pain Type: Surgical pain Pain Location: Leg Pain Orientation: Left Pain Descriptors / Indicators: Aching Pain Frequency: Intermittent Pain Onset: With Activity Patients Stated Pain Goal: 2 Pain Intervention(s): Repositioned;RN made aware;Ambulation/increased activity Multiple Pain Sites: No Locomotion : Ambulation Ambulation/Gait Assistance: Not tested (comment) Wheelchair Mobility Distance: 150   See FIM for current functional status  Therapy/Group: Individual Therapy  [name] [name], PT, DPT 03/16/2013, 4:23 PM

## 2013-03-17 DIAGNOSIS — T1490XA Injury, unspecified, initial encounter: Secondary | ICD-10-CM

## 2013-03-17 DIAGNOSIS — I2699 Other pulmonary embolism without acute cor pulmonale: Secondary | ICD-10-CM

## 2013-03-17 DIAGNOSIS — S82109A Unspecified fracture of upper end of unspecified tibia, initial encounter for closed fracture: Secondary | ICD-10-CM
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
## 2013-03-18 ENCOUNTER — Inpatient Hospital Stay (HOSPITAL_COMMUNITY): Payer: Worker's Compensation | Admitting: Physical Therapy

## 2013-03-18 ENCOUNTER — Inpatient Hospital Stay (HOSPITAL_COMMUNITY): Payer: Worker's Compensation | Admitting: Occupational Therapy

## 2013-03-18 DIAGNOSIS — E291 Testicular hypofunction: Secondary | ICD-10-CM

## 2013-03-18 DIAGNOSIS — R030 Elevated blood-pressure reading, without diagnosis of hypertension: Secondary | ICD-10-CM
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
## 2013-03-18 NOTE — Plan of Care (Signed)
Problem: RH PAIN MANAGEMENT Goal: RH STG PAIN MANAGED AT OR BELOW PT'S PAIN GOAL 3 or less on scale of 1-10  Outcome: Progressing Oxy IR 96m effective in controlling pain levels.

## 2013-03-18 NOTE — Progress Notes (Signed)
Physical Therapy Session Note  Patient Details  Name: KAYE MITRO MRN: [id number] Date of Birth: August 23, 1970  Today's Date: 03/18/2013 Time: 5456-2563 Time Calculation (min): 25 min  Short Term Goals: Week 1:  PT Short Term Goal 1 (Week 1): =LTG  Skilled Therapeutic Interventions/Progress Updates:  Pt was seen bedside in the am. Pt transferred bedside chair to edge of bed with S and verbal cues to adjust the bledsoe brace. Pt transferred edge of bed to supine with leg lifter and S. Bledsoe brace adjusted. Pt transferred supine to edge of bed with side rail and S. Pt ambulated with rolling walker and min guard 15 feet x 2, NWB L LE, occasional verbal cues for technique. Pt returned to bedside recliner. Performed arm chair push ups to increase UE strength to assist with transfers.    Therapy Documentation Precautions:  Precautions Precautions: Fall Required Braces or Orthoses: Other Brace/Splint Knee Immobilizer - Left: On at all times Other Brace/Splint: bledsoe brace Restrictions Weight Bearing Restrictions: Yes LLE Weight Bearing: Non weight bearing General:   Pain: Pt c/o mod pain L LE. Mobility:   Locomotion : Ambulation Ambulation/Gait Assistance: 4: Min guard   See FIM for current functional status  Therapy/Group: Individual Therapy  Dub Amis 03/18/2013, 2:21 PM

## 2013-03-18 NOTE — Progress Notes (Signed)
I have seen and examined the patient. I agree with the findings above.  Rozanna Box, MD

## 2013-03-18 NOTE — Progress Notes (Signed)
Occupational Therapy Session Note  Patient Details  Name: [name] [name] MRN: [id number] Date of Birth: January 17, 1970  Today's Date: 03/18/2013  First Session:  10:30-11:30; Time calculation (min):60 ADL in w/c at sink with focus on maintaining L LE NWB status.  Patient fatigued in standing for periarea & buttock cleansing but appeared to maintain NWB precaution.  Patient requires reacher or assist to don shorts over L LE   Second Session:  Patient completed bilateral UE triceps exercises with 10 lb weight and backwards arm circles at chest level to increase sit to stand, transfer status and endurance in order to help increase Upper body strength in order to  maintain L LE NWB precautions Time: 1300-1330 Time Calculation (min): 30 min   Therapy Documentation Precautions:  Precautions Precautions: Fall Required Braces or Orthoses: Other Brace/Splint Knee Immobilizer - Left: On at all times Other Brace/Splint: bledsoe brace Restrictions Weight Bearing Restrictions: Yes LLE Weight Bearing: Non weight bearing  Pain: denied  See FIM for current functional status  Therapy/Group: Individual Therapy  Alfredia Ferguson [facility] 03/18/2013, 5:30 PM

## 2013-03-18 NOTE — Progress Notes (Signed)
Physical Therapy Session Note  Patient Details  Name: [name] [name] MRN: [id number] Date of Birth: 1970-03-26  Today's Date: 03/18/2013 Time: 9470-7615 Time Calculation (min): 74 min  Short Term Goals: Week 1:  PT Short Term Goal 1 (Week 1): =LTG  Skilled Therapeutic Interventions/Progress Updates:  Pt was seen bedside in the pm. Pt transferred recliner to w/c with S and verbal cues to maintain NWB L LE. Pt propelled w/c to gym about 150 feet with S. Pt transferred w/c to edge of mat, edge of mat to supine, supine to edge of mat, mat to w/c with leg lifter, S and verbal cues, NWB L LE. While on mat performed LE exercises for strengthening and flexibility.   Therapy Documentation Precautions:  Precautions Precautions: Fall Required Braces or Orthoses: Other Brace/Splint Knee Immobilizer - Left: On at all times Other Brace/Splint: bledsoe brace Restrictions Weight Bearing Restrictions: Yes LLE Weight Bearing: Non weight bearing General:   Pain: Pt c/o mod pain L LE.   Mobility:   Locomotion : Ambulation Ambulation/Gait Assistance: 4: Min guard   See FIM for current functional status  Therapy/Group: Individual Therapy  Dub Amis 03/18/2013, 4:43 PM

## 2013-03-19 ENCOUNTER — Inpatient Hospital Stay (HOSPITAL_COMMUNITY): Payer: Worker's Compensation | Admitting: Occupational Therapy

## 2013-03-19 ENCOUNTER — Inpatient Hospital Stay (HOSPITAL_COMMUNITY): Payer: Self-pay

## 2013-03-19 LAB — CBC
HEMATOCRIT: 30.5 % — AB (ref 39.0–52.0)
Hemoglobin: 10.1 g/dL — ABNORMAL LOW (ref 13.0–17.0)
MCH: 26.2 pg (ref 26.0–34.0)
MCHC: 33.1 g/dL (ref 30.0–36.0)
MCV: 79 fL (ref 78.0–100.0)
PLATELETS: 686 10*3/uL — AB (ref 150–400)
RBC: 3.86 MIL/uL — AB (ref 4.22–5.81)
RDW: 14 % (ref 11.5–15.5)
WBC: 6.6 10*3/uL (ref 4.0–10.5)

## 2013-03-19 NOTE — Progress Notes (Signed)
Physical Therapy Session Note  Patient Details  Name: [name] [name] MRN: [id number] Date of Birth: October 22, 1970  Today's Date: 03/19/2013 Time: 3888-7579 Time Calculation (min): 56 min  Short Term Goals: Week 1:  PT Short Term Goal 1 (Week 1): =LTG  Skilled Therapeutic Interventions/Progress Updates:    Session consisted of pt performing w/c propulsion from his room to the gym and back at the end of the session with Mod I (pt showing improved propulsion speed and endurance) in order to improve functional independence, pt working on Geographical information systems officer with Min guard and the use of a RW with pt working on ascending curb backwards in order to help simulate ascending and descending steps to get into pt's home (pt needed verbal cueing for proper RW sequencing when ascending curb and to make sure pt adhered to weight bearing precautions), and pt performing gait 2 x 40' with S and the use of a RW (pt presenting with improved gait mechanics and RW sequencing without cueing and improved endurance when performing this task). Session concluded with pt performing stand pivot transfers with S (pt showing improved RLE activation and balance when performing this transfer) and the following therex to his LLE: SAQ (requiring active assistance), supine hip Abd/Add, and heel slides (requiring active assistance with ROM being about 45 degrees of knee flexion) in order to improve LLE strength and ROM. Also, pt had no c/o of light headedness throughout the session.   Therapy Documentation Precautions:  Precautions Precautions: Fall Required Braces or Orthoses: Other Brace/Splint Knee Immobilizer - Left: On at all times Other Brace/Splint: bledsoe brace Restrictions Weight Bearing Restrictions: Yes LLE Weight Bearing: Non weight bearing Pain: Pt c/o 2/10 LLE pain throughout the session. Pt took rest breaks and was repositioned in the recliner in order to help alleviate this pain.  See FIM for current functional  status  Therapy/Group: Individual Therapy  [name] [name] 03/19/2013, 12:29 PM

## 2013-03-19 NOTE — Progress Notes (Signed)
Reviewed and in agreement with treatment provided.  
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
## 2013-03-19 NOTE — Progress Notes (Signed)
Occupational Therapy Session Note  Patient Details  Name: [name] [name] MRN: [id number] Date of Birth: Oct 10, 1970  Today's Date: 03/19/2013 Time: 1638-4536 Time Calculation (min): 80 min  Short Term Goals: Week 1:  OT Short Term Goal 1 (Week 1): Short Term Goals = Long Term Goals      Skilled Therapeutic Interventions/Progress Updates:    Upon arrival, pt resting in recliner and was seen this session to address his house keeping goal.  Pt was able to direct his care to have w/c set up for his transfer with min cues. Pt needed a cue to place w/c as close as possible to his recliner. Pt transferred with S and propelled his chair to ADL apartment. He worked on making the bed by propelling his w/c around the bed and using a reacher to adjust the comforter. He was able to make the bed without assist. Discussed other homemaking activities of folding laundry and dusting he could do from his w/c.  Pt then propelled to gym to engage in UE exercises for his HEP.  Pt worked on [employer] and educated the pt on the importance of pressure relief.  Pt then worked on various theraband exercises and was given a handout listing the exercises and recommended repetitions.  He then spent 20 min on the Sci[employer] at level 15, taking one rest break. Pt participated extremely well. He propelled himself back to his room and transferred back to recliner with supervision. Phone and call light in reach.    Therapy Documentation Precautions:  Precautions Precautions: Fall Required Braces or Orthoses: Other Brace/Splint Knee Immobilizer - Left: On at all times Other Brace/Splint: bledsoe brace Restrictions Weight Bearing Restrictions: Yes LLE Weight Bearing: Non weight bearing   Pain: Pain Assessment Pain Assessment: 0-10 Pain Score: 2  Pain Type: Surgical pain Pain Location: Leg Pain Orientation: Left Pain Descriptors / Indicators: Aching Pain Onset: With Activity ADL:  See FIM for current functional  status  Therapy/Group: Individual Therapy  [place] 03/19/2013, 3:13 PM

## 2013-03-19 NOTE — Progress Notes (Signed)
Occupational Therapy Session Note  Patient Details  Name: [name] [name] MRN: [id number] Date of Birth: 1970/05/29  Today's Date: 03/19/2013 Time: 1100-1157 Time Calculation (min): 57 min  Short Term Goals: Week 1:  OT Short Term Goal 1 (Week 1): Short Term Goals = Long Term Goals  Skilled Therapeutic Interventions/Progress Updates:    Engaged in ADL retraining at sit > stand level at sink and functional transfer training.  Pt completed bathing at sink with focus on NWB through LLE when standing to complete grooming tasks.  Pt required verbal and demonstration cues for wheelchair safety (Locking brakes) when leaning forward in sitting to avoid tipping chair and prior to transfers.  Completed toilet and tub/shower transfer in ADL apt bathroom with pt ambulating to toilet with min/steady assist with RW while adhering to NWB status.  Utilized BSC over toilet to increase toilet height.  Demonstrated tub/shower transfer with tub bench and leg lifter, discussing pt unable to bathe at shower level until able to remove LLE brace.  Pt return demonstrated tub bench transfer with min assist due to need to lean backwards to bring LLE over tub ledge.  Discussed necessary AE and DME in preparation for d/c.    Therapy Documentation Precautions:  Precautions Precautions: Fall Required Braces or Orthoses: Other Brace/Splint Knee Immobilizer - Left: On at all times Other Brace/Splint: bledsoe brace Restrictions Weight Bearing Restrictions: Yes LLE Weight Bearing: Non weight bearing Pain: Pain Assessment Pain Assessment: 0-10 Pain Score: 2  Pain Type: Surgical pain Pain Location: Leg Pain Orientation: Left Pain Descriptors / Indicators: Aching Pain Onset: With Activity  See FIM for current functional status  Therapy/Group: Individual Therapy  Simonne Come 03/19/2013, 3:15 PM

## 2013-03-20 ENCOUNTER — Encounter (HOSPITAL_COMMUNITY): Payer: Self-pay | Admitting: Occupational Therapy

## 2013-03-20 ENCOUNTER — Inpatient Hospital Stay (HOSPITAL_COMMUNITY): Payer: Self-pay

## 2013-03-20 ENCOUNTER — Inpatient Hospital Stay (HOSPITAL_COMMUNITY): Payer: Worker's Compensation | Admitting: Occupational Therapy

## 2013-03-20 NOTE — Progress Notes (Signed)
Reviewed and in agreement with treatment provided.  

## 2013-03-20 NOTE — Progress Notes (Signed)
Occupational Therapy Session Note  Patient Details  Name: [name] [name] MRN: [id number] Date of Birth: 02/05/1970  Today's Date: 03/20/2013 Time: 2585-2778 Time Calculation (min): 30 min  Short Term Goals: Week 1:  OT Short Term Goal 1 (Week 1): Short Term Goals = Long Term Goals  Skilled Therapeutic Interventions/Progress Updates:  Patient resting in recliner upon arrival.  Engaged in toilet transfer, wash hands at sink, BUE exercises and bed mobility.  Focused session on activity tolerance, dynamic balance & functional mobility while maintaining LLE NWB, walker safety, side hops through narrow walkway, stand at sink to wash hands, BUE Theraband exercises started then patient report too tired therefore patient used lef lifter to get supine in bed and this OT assisted to remove abdominal binder and place pillow under LLE for heel to float.  Therapy Documentation Precautions:  Precautions Precautions: Fall Required Braces or Orthoses: Other Brace/Splint Knee Immobilizer - Left: On at all times Other Brace/Splint: bledsoe brace Restrictions Weight Bearing Restrictions: Yes LLE Weight Bearing: Non weight bearing Pain: 4/10 LLE, rest, repositioned See FIM for current functional status  Therapy/Group: Individual Therapy  Teighlor Korson 03/20/2013, 5:36 PM

## 2013-03-20 NOTE — Progress Notes (Signed)
Physical Therapy Session Note  Patient Details  Name: [name] [name] MRN: [id number] Date of Birth: [birth year]  Today's Date: 03/20/2013 Time: 3578-9784 Time Calculation (min): 57 min  Short Term Goals: Week 1:  PT Short Term Goal 1 (Week 1): =LTG  Skilled Therapeutic Interventions/Progress Updates:    Session consisted of pt performing stand pivot transfers with Mod I (pt showing improved BUE and RLE strength when performing this transfer), performing a stand pivot car transfer with Min A with the use of a leg lifter(pt needed assistance in order to properly position w/c to perform this transfer with pt also needing assistance to lift and lower LLE), pt performing gait around cones with S-Min A (mainly S but Min A required on one occasion due to pt LOB) and the use of a RW (pt showing improved endurance, RW sequencing, BUE strength, and ability to follow weightbearing precautions without cueing), and pt performing lateral stepping with Min guard and the use of a RW (pt needing verbal cueing for proper RW sequencing and to prevent excessive external rotation of his RLE when laterally stepping to the right) in order to help improve pt's safety at home. Pt had no c/o dizziness throughout the session with pt's endurance also continuing to improve.   Therapy Documentation Precautions:  Precautions Precautions: Fall Required Braces or Orthoses: Other Brace/Splint Knee Immobilizer - Left: On at all times Other Brace/Splint: bledsoe brace Restrictions Weight Bearing Restrictions: Yes LLE Weight Bearing: Non weight bearing Pain:  Pt c/o 4/10 LLE pain throughout the session. Pt was premedicated for pain and took rest breaks throughout in order to help alleviate this pain.  See FIM for current functional status  Therapy/Group: Individual Therapy  Marticia Reifschneider 03/20/2013, 8:35 AM
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
## 2013-03-20 NOTE — Progress Notes (Signed)
Occupational Therapy Session Note  Patient Details  Name: [name] [name] MRN: [id number] Date of Birth: 30-Aug-1970  Today's Date: 03/20/2013 Time: 1105-1200 Time Calculation (min): 55 min  Short Term Goals: Week 1:  OT Short Term Goal 1 (Week 1): Short Term Goals = Long Term Goals  Skilled Therapeutic Interventions/Progress Updates:  Upon entering room, patient found seated in recliner with 4/10 pain in LLE - RN aware. Patient engaged in UB/LB bathing & dressing tasks in sit<>stand position at sink focusing on NWB > LLE, dynamic standing balance/tolerance/endurance, and overall activity tolerance/endurance. With min encouragement, patient stood for grooming task of brushing teeth. Therapist discussed best way to perform grooming tasks at home, standing at sink. Patient is currently supervision for BADL tasks, mainly for safety. Plan is for patient to discharge 3/19 at an overall mod I level. Patient takes more than a reasonable amount of time secondary to multiple seated rest breaks taken during activities. Patient has gotten better about taking seated rest breaks prn and appropriately for his overall safety. At end of session, left patient seated in recliner with all needed items within reach.   Precautions:  Precautions Precautions: Fall Required Braces or Orthoses: Other Brace/Splint Knee Immobilizer - Left: On at all times Other Brace/Splint: bledsoe brace Restrictions Weight Bearing Restrictions: Yes LLE Weight Bearing: Non weight bearing  See FIM for current functional status  Therapy/Group: Individual Therapy  Samona Chihuahua 03/20/2013, 12:07 PM

## 2013-03-20 NOTE — Progress Notes (Signed)
Physical Therapy Session Note  Patient Details  Name: [name] [name] MRN: [id number] Date of Birth: [birth year]  Today's Date: 03/20/2013 Time: 3419-3790 Time Calculation (min): 43 min  Short Term Goals: Week 1:  PT Short Term Goal 1 (Week 1): =LTG  Skilled Therapeutic Interventions/Progress Updates:    Session consisted of pt performing w/c propulsion from his room to the rehab gym and back with Mod I (pt showing improved propulsion speed and did not need to take a rest break when performing this activity), pt working on curb management by ascending the curb backwards x 2 (in order to replicate steps necessary for pt to get into home) with Min guard and the use of a RW (pt showing improved ability to ascend and descend step with proper RW sequencing and while following weightbearing precautions without the need of verbal cueing with assistance mainly needed in order to steady pt), and pt performing an obstacle course with Min A and the use of a RW which consisted of stepping over poles (pt needing verbal cueing in order to remember to get close to pole before lifting RW) and weaving between cones (pt showing improved control with this activity compared to this morning with pt having no LOB throughout). Session ended with pt performing standing ball taps with Min guard and the use of a RW with pt showing good ability to maintain balance with the ball being tossed in various directions with use of unilateral UE support (activity done in order to improve pt's dynamic standing balance).   Therapy Documentation Precautions:  Precautions Precautions: Fall Required Braces or Orthoses: Other Brace/Splint Knee Immobilizer - Left: On at all times Other Brace/Splint: bledsoe brace Restrictions Weight Bearing Restrictions: Yes LLE Weight Bearing: Non weight bearing Pain:  Pt had no c/o pain during the session.  See FIM for current functional status  Therapy/Group: Individual  Therapy  [name] 03/20/2013, 1:46 PM

## 2013-03-21 ENCOUNTER — Inpatient Hospital Stay (HOSPITAL_COMMUNITY): Payer: Self-pay | Admitting: Occupational Therapy

## 2013-03-21 ENCOUNTER — Inpatient Hospital Stay (HOSPITAL_COMMUNITY): Payer: Self-pay

## 2013-03-21 ENCOUNTER — Encounter (HOSPITAL_COMMUNITY): Payer: Self-pay | Admitting: Occupational Therapy

## 2013-03-21 DIAGNOSIS — S82209A Unspecified fracture of shaft of unspecified tibia, initial encounter for closed fracture: Secondary | ICD-10-CM
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
## 2013-03-21 NOTE — Progress Notes (Signed)
Reviewed and in agreement with discharge assessment provided.
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
## 2013-03-21 NOTE — Progress Notes (Signed)
Occupational Therapy Discharge Summary  Patient Details  Name: [name] [name] MRN: [id number] Date of Birth: [birth year]  Today's Date: 03/21/2013  Patient has met 32 of 11 long term goals due to improved activity tolerance, improved balance, postural control, ability to compensate for deficits, improved attention, improved awareness and improved coordination.  Patient to discharge at overall Modified Independent level.  Patient's care partner is independent to provide the necessary supervision prn assistance at discharge.    Reasons goals not met: n/a, all goals met at this time.   Recommendation:  Patient will benefit from ongoing skilled OT services in home health setting to continue to advance functional skills in the area of BADL, iADL and Reduce care partner burden.  Equipment: BSC, hip kit(reacher, sock aid, long handled shoe horn, long handled sponge), leg lifter  Reasons for discharge: treatment goals met and discharge from hospital  Patient/family agrees with progress made and goals achieved: Yes  Precautions/Restrictions  Precautions Precautions: Fall Required Braces or Orthoses: Other Brace/Splint Knee Immobilizer - Left: On at all times Other Brace/Splint: bledsoe brace Restrictions Weight Bearing Restrictions: Yes LLE Weight Bearing: Non weight bearing  Vital Signs Therapy Vitals Temp: 98.4 F (36.9 C) Temp src: Oral Pulse Rate: 88 Resp: 17 BP: 107/70 mmHg Patient Position, if appropriate: Lying Oxygen Therapy SpO2: 100 % O2 Device: None (Room air)  ADL - See FIM for more information ADL Equipment Provided: Reacher;Sock aid;Long-handled shoe horn;Long-handled sponge (AE prn to increase independence with ADL tasks)  Vision/Perception  Vision - History Baseline Vision: No visual deficits Patient Visual Report: No change from baseline Vision - Assessment Eye Alignment: Within Functional Limits Perception Perception: Within Functional  Limits Praxis Praxis: Intact   Cognition Overall Cognitive Status: Within Functional Limits for tasks assessed Arousal/Alertness: Awake/alert Orientation Level: Oriented X4 Memory: Appears intact Awareness: Appears intact Problem Solving: Appears intact Safety/Judgment: Appears intact Comments: overall anxiety has decreased during patient's CIR stay  Sensation Sensation Additional Comments: BUEs appear intact Coordination Gross Motor Movements are Fluid and Coordinated: Yes Fine Motor Movements are Fluid and Coordinated: Yes  Motor  Motor Motor: Within Functional Limits  Trunk/Postural Assessment  Cervical Assessment Cervical Assessment: Within Functional Limits Thoracic Assessment Thoracic Assessment: Within Functional Limits Lumbar Assessment Lumbar Assessment: Within Functional Limits Postural Control Postural Control: Within Functional Limits   Balance Balance Balance Assessed: Yes Dynamic Sitting Balance Dynamic Sitting - Level of Assistance: 6: Modified independent (Device/Increase time) Static Standing Balance Static Standing - Level of Assistance: 6: Modified independent (Device/Increase time) (with use of RW) Dynamic Standing Balance Dynamic Standing - Level of Assistance: 6: Modified independent (Device/Increase time) (with use of RW)  Extremity/Trunk Assessment RUE Assessment RUE Assessment: Within Functional Limits LUE Assessment LUE Assessment: Within Functional Limits  See FIM for current functional status  [name] 03/21/2013, 3:41 PM

## 2013-03-21 NOTE — Progress Notes (Signed)
Reviewed and in agreement with treatment provided.  

## 2013-03-21 NOTE — Progress Notes (Signed)
Occupational Therapy Session Notes  Patient Details  Name: [name] [name] MRN: [id number] Date of Birth: 14-Dec-1970  Today's Date: 03/21/2013  Short Term Goals: Week 1:  OT Short Term Goal 1 (Week 1): Short Term Goals = Long Term Goals  Skilled Therapeutic Interventions/Progress Updates:   Session #1 [phone] - 65 Minutes Individual Therapy No complaints of pain Upon entering room, patient found supine in bed with no complaints of pain. Patient sat EOB at mod I level, transferred > w/c and then performed UB/LB bathing & dressing tasks at sink in sit<>stand position. After bathing & dressing, patient ambulated in/out of bathroom for toileting needs. Patient overall at a mod I level for BADL tasks, completing them safely and effectively(locking brakes on w/c and adhering to NWB status > LLE when standing and functionally ambulating). Patient uses leg lifter and hip kit (reacher, sock aid, long handled shoe horn, long handled sponge) prn to increase independence with ADL tasks. Plan is for patient to discharge > home tomorrow with wife. Patient is able to independently complete UE HEP of w/c pushups throughout the day for strengthening > BUEs. After ADL, patient propelled self > ADL apartment and transferred <> couch at mod I level. Patient left seated in w/c at end of session with PT present.   Session #2 [id number] - 30 Minutes Individual Therapy No complaints of pain Upon entering room, patient found seated in recliner. Patient transferred recliner>w/c at mod I level, then stood on scale for NT. Patient propelled self > tub room for tub/shower transfer on/off tub transfer bench. Patient able to complete transfer at supervision level, ambulating with RW in/out of bathroom. Patient used leg lifter for management of LLE throughout session. Patient propelled self back to room and left seated in w/c with all needed items within reach.   Precautions:  Precautions Precautions: Fall Required  Braces or Orthoses: Other Brace/Splint Knee Immobilizer - Left: On at all times Other Brace/Splint: bledsoe brace Restrictions Weight Bearing Restrictions: Yes LLE Weight Bearing: Non weight bearing  See FIM for current functional status  Girlie Veltri 03/21/2013, 7:22 AM

## 2013-03-21 NOTE — Progress Notes (Signed)
Physical Therapy Discharge Summary  Patient Details  Name: [name] [name] MRN: [id number] Date of Birth: 06/22/1970  Today's Date: 03/21/2013  Patient has met 10 of 10 long term goals due to improved activity tolerance, improved balance, increased strength, increased range of motion, decreased pain, ability to compensate for deficits and functional use of  right upper extremity, right lower extremity, left upper extremity and left lower extremity.  Patient made good progress throughout his stay at inpatient rehab and is to discharge at a wheelchair level Modified Independent with S and the use of a RW needed for short distance gait and Min A and the use of a RW needed to climb steps necessary for entry into home.    Patient's wife and father are available to provide necessary physical assistance at discharge.    Recommendation:  Patient will benefit from ongoing skilled PT services in home health setting to continue to advance safe functional mobility, address ongoing impairments in gait, transfers, LLE strength and ROM, stair climbing, car transfers, bed mobility, endurance, static and dynamic standing balance, and minimize fall risk.  Equipment: 18x18 wheelchair, basic cushion, elevated leg rests, and rolling walker  Reasons for discharge: treatment goals met and discharge from hospital  Patient/family agrees with progress made and goals achieved: Yes  PT Discharge Precautions/Restrictions Precautions Precautions: Fall Required Braces or Orthoses: Other Brace/Splint Knee Immobilizer - Left: On at all times Other Brace/Splint: bledsoe brace Restrictions Weight Bearing Restrictions: Yes LLE Weight Bearing: Non weight bearing Cognition Overall Cognitive Status: Within Functional Limits for tasks assessed Arousal/Alertness: Awake/alert Orientation Level: Oriented X4 Safety/Judgment: Appears intact Sensation Sensation Light Touch: Appears Intact Coordination Gross Motor Movements  are Fluid and Coordinated: Yes Motor  Motor Motor: Within Functional Limits   Trunk/Postural Assessment  Cervical Assessment Cervical Assessment: Within Functional Limits Thoracic Assessment Thoracic Assessment: Within Functional Limits Lumbar Assessment Lumbar Assessment: Within Functional Limits Postural Control Postural Control: Within Functional Limits  Balance Balance Balance Assessed: Yes Dynamic Sitting Balance Dynamic Sitting - Level of Assistance: 6: Modified independent (Device/Increase time) Static Standing Balance Static Standing - Level of Assistance: 6: Modified independent (Device/Increase time) (with use of RW) Dynamic Standing Balance Dynamic Standing - Level of Assistance: 6: Modified independent (Device/Increase time) (with use of RW) Extremity Assessment      RLE Assessment RLE Assessment: Within Functional Limits RLE Strength RLE Overall Strength Comments: MMT grades: Hip Flexion 4+/5, Knee Extension 5/5, Ankle Plantar and Dorsiflexion 5/5 LLE Assessment LLE Assessment: Exceptions to WFL LLE PROM (degrees) LLE Overall AROM Comments: Knee Flexion AROM about 50 degrees LLE Strength LLE Overall Strength Comments: MMT grades: Hip Flexion 1/5, Knee Extension 1/5, Ankle Plantar and Dorsifllexion 4/5  See FIM for current functional status  Adilee Lemme 03/21/2013, 3:19 PM
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
## 2013-03-21 NOTE — Progress Notes (Signed)
Physical Therapy Session Note  Patient Details  Name: [name] [name] MRN: [id number] Date of Birth: [birth year]  Today's Date: 03/21/2013 Time: 7903-8333 Time Calculation (min): 58 min  Short Term Goals: Week 1:  PT Short Term Goal 1 (Week 1): =LTG  Skilled Therapeutic Interventions/Progress Updates:    Session consisted of pt performing stand pivot car transfers with S (pt needing assistance with w/c setup with pt also using leg lifter to help get his leg into and out of the car), pt performing stand pivot transfers with Mod I (pt showing improved RLE control and ability to use BUE's to safely perform transfer), pt performing bed mobility with Mod I and the use of a leg lifter, and pt performing w/c mobility with Mod I (pt showing improved endurance throughout) in order to improve pt's safety at home. Session concluded with pt ascending and descending a curb x 2 with Min A and the use of a RW in order to simulate pt's entry into home (pt able to properly sequence RW without verbal cueing and showed good RLE control throughout), pt performing gait x 40' with Min guard and the use of a RW (pt showing improved UE and RLE strength throughout with pt also showing improved endurance), and pt being instructed on and performing a HEP for his LLE (Heel slides, Ankle Pumps, Supine Hip Abd/Add, Pillow Squeezes, and SLR) with pt being instructed on and demonstrating the use of a sheet to help assist with these exercises.   Therapy Documentation Precautions:  Precautions Precautions: Fall Required Braces or Orthoses: Other Brace/Splint Knee Immobilizer - Left: On at all times Other Brace/Splint: bledsoe brace Restrictions Weight Bearing Restrictions: Yes LLE Weight Bearing: Non weight bearing Pain:  Pt had c/o LLE pain throughout the session. Pt took rest breaks and was repositioned in bed in order to help alleviate this pain.  See FIM for current functional status  Therapy/Group: Individual  Therapy  Honorio Devol 03/21/2013, 12:51 PM

## 2013-03-21 NOTE — Progress Notes (Signed)
Social Work Patient ID: [name] [name], male   DOB: 1970/10/22, 42 y.o.   MRN: [id number]  CSW met with pt to update him on team conference discussion.  Told pt that he is still on target for 03-22-13 d/c and he was pleased about this.  He confirmed that his father was coming into town on 03-21-13 and would be on CIR to receive any information he needs to have to help pt.  Pt's wife was in the emergency room on 03-20-13 and pt was concerned about her, but MD note from today indicates that wife was present in pt's room and doing well this morning.  Pt told CSW that she tends to have headaches and he is helpful this is just a severe headache.  CSW continues to work with worker's comp case Freight forwarder, Kevan Ny, to arrange pt's home needs.  He is ordering pt's DME (w/c, 3-in-1 commode, rolling walker, leg lifter, hip kit) and this CSW will arrange HH through the care management company he told CSW to use.  CSW will continue to follow pt and assist in any other needs, as well as support him through the rest of his stay.

## 2013-03-21 NOTE — Discharge Summary (Signed)
Discharge summary job [phone]

## 2013-03-21 NOTE — Progress Notes (Signed)
Physical Therapy Session Note  Patient Details  Name: [name] [name] MRN: [id number] Date of Birth: 1970/09/07  Today's Date: 03/21/2013 Time: 5997-7414 Time Calculation (min): 46 min  Short Term Goals: Week 1:  PT Short Term Goal 1 (Week 1): =LTG  Skilled Therapeutic Interventions/Progress Updates:    Session focused on family education with pt's wife present. Pt's wife was taught proper assistance techniques when pt is performing gait (discussed how pt requires close S when performing this and that pt can only perform gait for short distances), stairs (ascending step backwards with RW with pt also taught proper guarding technique), basic transfers (discussed proper supervision techniques and how to put on and take off leg rests), and car transfers (including discussion on how pt will need to move front seat back or sit in the back seat in order to get into and out of car due to limited LLE ROM) with pt's wife verbalizing and demonstrating understanding of these techniques. It was also emphasized with the pt and his wife the importance of him using the w/c as much as possible within the home , only performing gait when necessary for the time being, and adherence to pt's weightbearing restrictions. Chair setup in the home was also discussed if pt needs to perform gait in order for him to take rest breaks. Session concluded with pt performing gait x 40' with S and the use of a RW (pt presenting with improved balance during gait this afternoon).   Therapy Documentation Precautions:  Precautions Precautions: Fall Required Braces or Orthoses: Other Brace/Splint Knee Immobilizer - Left: On at all times Other Brace/Splint: bledsoe brace Restrictions Weight Bearing Restrictions: Yes LLE Weight Bearing: Non weight bearing Pain:  Pt c/o mild LLE pain throughout the session. Pt took rest breaks and was repositioned in the recliner at the end of the session in order to help alleviate this pain.     See FIM for current functional status  Therapy/Group: Individual Therapy  Linnae Rasool 03/21/2013, 2:54 PM

## 2013-03-22 MED ORDER — OXYCODONE HCL 5 MG PO TABS: 5.0000 mg | ORAL_TABLET | ORAL | Status: DC | PRN

## 2013-03-22 MED ORDER — VITAMIN D (ERGOCALCIFEROL) 1.25 MG (50000 UNIT) PO CAPS: 50000.0000 [IU] | ORAL_CAPSULE | ORAL | Status: DC

## 2013-03-22 MED ORDER — APIXABAN 5 MG PO TABS: 5.0000 mg | ORAL_TABLET | Freq: Two times a day (BID) | ORAL | Status: DC

## 2013-03-22 MED ORDER — METHOCARBAMOL 500 MG PO TABS: 500.0000 mg | ORAL_TABLET | Freq: Four times a day (QID) | ORAL | Status: DC | PRN

## 2013-03-22 MED ORDER — METOPROLOL TARTRATE 12.5 MG HALF TABLET: 12.5000 mg | ORAL_TABLET | Freq: Two times a day (BID) | ORAL | Status: DC
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
## 2013-03-22 NOTE — Progress Notes (Signed)
As per order, sutures removed from patient's inner left and lateral leg at 2020. Patient tolerated well. Kendell Sagraves A. Minami Arriaga, RN.

## 2013-03-22 NOTE — Progress Notes (Signed)
Social Work Discharge Note Discharge Note  The overall goal for the admission was met for:   Discharge location: Yes - home  Length of Stay: Yes - 10 days  Discharge activity level: Yes - overall modified independent, with min assistance with car tx and stairs  Home/community participation: Yes  Services provided included: MD, RD, PT, OT, RN, Pharmacy and [place]: Worker's Comp  Follow-up services arranged: Home Health: Therapist, sports, PT, OT, DME: 18x18 W/C with basic cushion; rolling walker; 3-in-1 commode; hip kit; leg lifter and Patient/Family has no preference for HH/DME agencies - Worker's Comp Case Manager arranged  Comments (or additional information):  Patient/Family verbalized understanding of follow-up arrangements: Yes  Individual responsible for coordination of the follow-up plan: pt, wife, and pt's father  Confirmed correct DME delivered: Trey Sailors 03/22/2013    Bobbie Virden, Silvestre Mesi

## 2013-03-22 NOTE — Discharge Instructions (Signed)
Inpatient Rehab Discharge Instructions  DEIGO ALONSO Discharge date and time: No discharge date for patient encounter.   Activities/Precautions/ Functional Status: Activity: touchdown weightbearing Diet: regular diet Wound Care: keep wound clean and dry Functional status:  ___ No restrictions     ___ Walk up steps independently ___ 24/7 supervision/assistance   ___ Walk up steps with assistance ___ Intermittent supervision/assistance  ___ Bathe/dress independently _x__ Walk with walker     ___ Bathe/dress with assistance ___ Walk Independently    ___ Shower independently ___ Walk with assistance    ___ Shower with assistance ___ No alcohol     ___ Return to work/school ________  COMMUNITY REFERRALS UPON DISCHARGE:   Home Health:   PT     OT       RN  Agency:  [facility]   Phone:  [phone] Medical Equipment/Items Ordered: 18x18 lightweight w/c with basic cushion; rolling walker; 3-in-1 commode; leg lifter; hip kit   Agency/Supplier:  Arranged by Kevan Ny, [facility] Case Manager  [phone]  Special Instructions:  Followup with primary care doctor in regards to deficiencies of testosterone and vitamin D.and question endocrine referral  My questions have been answered and I understand these instructions. I will adhere to these goals and the provided educational materials after my discharge from the hospital.  Patient/Caregiver Signature _______________________________ Date __________  Clinician Signature _______________________________________ Date __________  Please bring this form and your medication list with you to all your follow-up doctor's appointments.

## 2013-03-22 NOTE — Discharge Planning (Signed)
Patient requested pain medication. Gave Oxy IR 10 mg and Robaxin 1 tablet. Wife and father here for transport home. Linna Hoff, PA reviewed discharge instructions. Patient discharged, stable condition. Teaching done. Tonita Cong, RN

## 2013-03-26 ENCOUNTER — Ambulatory Visit: Payer: Self-pay | Admitting: Gastroenterology

## 2013-04-10 ENCOUNTER — Ambulatory Visit: Payer: Worker's Compensation | Attending: Orthopedic Surgery | Admitting: Physical Therapy

## 2013-04-10 DIAGNOSIS — R5381 Other malaise: Secondary | ICD-10-CM | POA: Insufficient documentation

## 2013-04-10 DIAGNOSIS — R262 Difficulty in walking, not elsewhere classified: Secondary | ICD-10-CM | POA: Insufficient documentation

## 2013-04-10 DIAGNOSIS — R609 Edema, unspecified: Secondary | ICD-10-CM | POA: Insufficient documentation

## 2013-04-10 DIAGNOSIS — M25569 Pain in unspecified knee: Secondary | ICD-10-CM | POA: Insufficient documentation

## 2013-04-10 DIAGNOSIS — IMO0001 Reserved for inherently not codable concepts without codable children: Secondary | ICD-10-CM | POA: Insufficient documentation

## 2013-04-10 DIAGNOSIS — M6281 Muscle weakness (generalized): Secondary | ICD-10-CM | POA: Insufficient documentation

## 2013-04-10 DIAGNOSIS — R269 Unspecified abnormalities of gait and mobility: Secondary | ICD-10-CM | POA: Insufficient documentation

## 2013-04-10 DIAGNOSIS — M25669 Stiffness of unspecified knee, not elsewhere classified: Secondary | ICD-10-CM | POA: Insufficient documentation

## 2013-04-11 ENCOUNTER — Ambulatory Visit: Payer: Worker's Compensation | Admitting: Physical Therapy

## 2013-04-17 ENCOUNTER — Ambulatory Visit: Payer: Worker's Compensation | Admitting: Rehabilitation

## 2013-04-19 ENCOUNTER — Ambulatory Visit: Payer: Worker's Compensation | Admitting: Physical Therapy

## 2013-04-24 ENCOUNTER — Ambulatory Visit: Payer: Worker's Compensation | Admitting: Physical Therapy

## 2013-04-26 ENCOUNTER — Ambulatory Visit: Payer: Worker's Compensation | Admitting: Rehabilitation

## 2013-04-27 ENCOUNTER — Encounter: Payer: Self-pay | Admitting: Physical Medicine & Rehabilitation

## 2013-04-27 ENCOUNTER — Encounter
Payer: Worker's Compensation | Attending: Physical Medicine & Rehabilitation | Admitting: Physical Medicine & Rehabilitation

## 2013-04-27 VITALS — BP 105/65 | HR 93 | Resp 14 | Ht 70.0 in | Wt 213.0 lb

## 2013-04-27 DIAGNOSIS — K5080 Crohn's disease of both small and large intestine without complications: Secondary | ICD-10-CM | POA: Insufficient documentation

## 2013-04-27 DIAGNOSIS — M898X9 Other specified disorders of bone, unspecified site: Secondary | ICD-10-CM | POA: Insufficient documentation

## 2013-04-27 DIAGNOSIS — S82143A Displaced bicondylar fracture of unspecified tibia, initial encounter for closed fracture: Secondary | ICD-10-CM

## 2013-04-27 DIAGNOSIS — Z7901 Long term (current) use of anticoagulants: Secondary | ICD-10-CM | POA: Diagnosis not present

## 2013-04-27 DIAGNOSIS — S82109A Unspecified fracture of upper end of unspecified tibia, initial encounter for closed fracture: Secondary | ICD-10-CM

## 2013-04-27 DIAGNOSIS — M948X9 Other specified disorders of cartilage, unspecified sites: Secondary | ICD-10-CM

## 2013-04-27 DIAGNOSIS — S8290XD Unspecified fracture of unspecified lower leg, subsequent encounter for closed fracture with routine healing: Secondary | ICD-10-CM | POA: Diagnosis present

## 2013-04-27 DIAGNOSIS — E291 Testicular hypofunction: Secondary | ICD-10-CM | POA: Diagnosis not present

## 2013-04-27 DIAGNOSIS — E559 Vitamin D deficiency, unspecified: Secondary | ICD-10-CM | POA: Diagnosis not present

## 2013-04-27 DIAGNOSIS — I2699 Other pulmonary embolism without acute cor pulmonale: Secondary | ICD-10-CM

## 2013-04-27 NOTE — Patient Instructions (Addendum)
PLEASE CALL ME WITH ANY PROBLEMS OR QUESTIONS (#[phone]).     YOU ARE UNABLE TO RETURN TO WORK AT THIS POINT.
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
## 2013-04-30 ENCOUNTER — Ambulatory Visit: Payer: Worker's Compensation

## 2013-05-01 ENCOUNTER — Ambulatory Visit: Payer: Worker's Compensation | Admitting: Rehabilitation

## 2013-05-07 ENCOUNTER — Ambulatory Visit: Payer: Worker's Compensation | Attending: Orthopedic Surgery | Admitting: Rehabilitation

## 2013-05-07 DIAGNOSIS — Z5189 Encounter for other specified aftercare: Secondary | ICD-10-CM | POA: Insufficient documentation

## 2013-05-07 DIAGNOSIS — M6281 Muscle weakness (generalized): Secondary | ICD-10-CM | POA: Insufficient documentation

## 2013-05-07 DIAGNOSIS — R269 Unspecified abnormalities of gait and mobility: Secondary | ICD-10-CM | POA: Insufficient documentation

## 2013-05-07 DIAGNOSIS — R262 Difficulty in walking, not elsewhere classified: Secondary | ICD-10-CM | POA: Insufficient documentation

## 2013-05-07 DIAGNOSIS — R609 Edema, unspecified: Secondary | ICD-10-CM | POA: Insufficient documentation

## 2013-05-07 DIAGNOSIS — R5381 Other malaise: Secondary | ICD-10-CM | POA: Insufficient documentation

## 2013-05-07 DIAGNOSIS — M25669 Stiffness of unspecified knee, not elsewhere classified: Secondary | ICD-10-CM | POA: Insufficient documentation

## 2013-05-07 DIAGNOSIS — M25569 Pain in unspecified knee: Secondary | ICD-10-CM | POA: Insufficient documentation

## 2013-05-09 ENCOUNTER — Ambulatory Visit: Payer: Worker's Compensation | Admitting: Rehabilitation

## 2013-05-11 ENCOUNTER — Ambulatory Visit: Payer: Worker's Compensation | Admitting: Physical Therapy

## 2013-05-15 ENCOUNTER — Ambulatory Visit: Payer: Worker's Compensation | Admitting: Rehabilitation

## 2013-05-17 ENCOUNTER — Ambulatory Visit: Payer: Worker's Compensation

## 2013-05-18 ENCOUNTER — Telehealth: Payer: Self-pay

## 2013-05-18 ENCOUNTER — Ambulatory Visit: Payer: Worker's Compensation

## 2013-05-18 NOTE — Telephone Encounter (Signed)
Richardson Landry (Case Manager @ [facility]) called requesting a refill of Eliquis for the patient. Contacted Richardson Landry to inform him that patient would need to get the refills for Eliquis from his PCP.

## 2013-05-21 ENCOUNTER — Ambulatory Visit: Payer: Worker's Compensation | Admitting: Rehabilitation

## 2013-05-22 ENCOUNTER — Ambulatory Visit: Payer: Worker's Compensation | Admitting: Rehabilitation

## 2013-05-23 ENCOUNTER — Ambulatory Visit: Payer: Worker's Compensation | Admitting: Rehabilitation

## 2013-05-30 ENCOUNTER — Ambulatory Visit: Payer: Worker's Compensation | Admitting: Rehabilitation

## 2013-06-01 ENCOUNTER — Ambulatory Visit: Payer: Worker's Compensation

## 2013-06-04 ENCOUNTER — Ambulatory Visit: Payer: Worker's Compensation | Attending: Orthopedic Surgery | Admitting: Rehabilitation

## 2013-06-04 DIAGNOSIS — M25669 Stiffness of unspecified knee, not elsewhere classified: Secondary | ICD-10-CM | POA: Insufficient documentation

## 2013-06-04 DIAGNOSIS — R262 Difficulty in walking, not elsewhere classified: Secondary | ICD-10-CM | POA: Insufficient documentation

## 2013-06-04 DIAGNOSIS — M6281 Muscle weakness (generalized): Secondary | ICD-10-CM | POA: Insufficient documentation

## 2013-06-04 DIAGNOSIS — M25569 Pain in unspecified knee: Secondary | ICD-10-CM | POA: Insufficient documentation

## 2013-06-04 DIAGNOSIS — Z5189 Encounter for other specified aftercare: Secondary | ICD-10-CM | POA: Insufficient documentation

## 2013-06-04 DIAGNOSIS — R609 Edema, unspecified: Secondary | ICD-10-CM | POA: Insufficient documentation

## 2013-06-04 DIAGNOSIS — R269 Unspecified abnormalities of gait and mobility: Secondary | ICD-10-CM | POA: Insufficient documentation

## 2013-06-04 DIAGNOSIS — R5381 Other malaise: Secondary | ICD-10-CM | POA: Insufficient documentation

## 2013-06-06 ENCOUNTER — Ambulatory Visit: Payer: Worker's Compensation | Admitting: Rehabilitation

## 2013-06-07 ENCOUNTER — Ambulatory Visit: Payer: Worker's Compensation | Admitting: Rehabilitation

## 2013-06-12 ENCOUNTER — Ambulatory Visit: Payer: Worker's Compensation

## 2013-06-13 ENCOUNTER — Ambulatory Visit: Payer: Worker's Compensation | Admitting: Rehabilitation

## 2013-06-14 ENCOUNTER — Ambulatory Visit: Payer: Worker's Compensation | Admitting: Physical Therapy

## 2013-06-18 ENCOUNTER — Ambulatory Visit: Payer: Worker's Compensation | Admitting: Physical Therapy

## 2013-06-19 ENCOUNTER — Ambulatory Visit: Payer: Worker's Compensation | Admitting: Physical Therapy

## 2013-06-21 ENCOUNTER — Encounter (HOSPITAL_COMMUNITY): Payer: Self-pay | Admitting: Pharmacy Technician

## 2013-06-21 ENCOUNTER — Encounter: Payer: Worker's Compensation | Admitting: Physical Therapy

## 2013-06-22 ENCOUNTER — Ambulatory Visit (HOSPITAL_COMMUNITY)
Admission: RE | Admit: 2013-06-22 | Discharge: 2013-06-22 | Disposition: A | Discharge status: Home / Self Care | Payer: Worker's Compensation | Source: Ambulatory Visit | Attending: Anesthesiology | Admitting: Anesthesiology

## 2013-06-22 ENCOUNTER — Encounter (HOSPITAL_COMMUNITY)
Admission: RE | Admit: 2013-06-22 | Discharge: 2013-06-22 | Disposition: A | Discharge status: Home / Self Care | Payer: Worker's Compensation | Source: Ambulatory Visit | Attending: Orthopedic Surgery | Admitting: Orthopedic Surgery

## 2013-06-22 ENCOUNTER — Encounter (HOSPITAL_COMMUNITY): Payer: Self-pay

## 2013-06-22 HISTORY — DX: Essential (primary) hypertension: I10

## 2013-06-22 HISTORY — DX: Other pulmonary embolism without acute cor pulmonale: I26.99

## 2013-06-22 LAB — BASIC METABOLIC PANEL
BUN: 8 mg/dL (ref 6–23)
CHLORIDE: 102 meq/L (ref 96–112)
CO2: 27 mEq/L (ref 19–32)
CREATININE: 1.13 mg/dL (ref 0.50–1.35)
Calcium: 9.8 mg/dL (ref 8.4–10.5)
GFR, EST NON AFRICAN AMERICAN: 79 mL/min — AB (ref >90)
Glucose, Bld: 104 mg/dL — ABNORMAL HIGH (ref 70–99)
Potassium: 3.9 mEq/L (ref 3.7–5.3)
Sodium: 141 mEq/L (ref 137–147)

## 2013-06-22 LAB — CBC
HEMATOCRIT: 38.8 % — AB (ref 39.0–52.0)
Hemoglobin: 12.3 g/dL — ABNORMAL LOW (ref 13.0–17.0)
MCH: 23.7 pg — ABNORMAL LOW (ref 26.0–34.0)
MCHC: 31.7 g/dL (ref 30.0–36.0)
MCV: 74.6 fL — ABNORMAL LOW (ref 78.0–100.0)
Platelets: 317 10*3/uL (ref 150–400)
RBC: 5.2 MIL/uL (ref 4.22–5.81)
RDW: 16.9 % — ABNORMAL HIGH (ref 11.5–15.5)
WBC: 5.6 10*3/uL (ref 4.0–10.5)

## 2013-06-22 LAB — PROTIME-INR
INR: 0.99 (ref 0.00–1.49)
PROTHROMBIN TIME: 12.9 s (ref 11.6–15.2)

## 2013-06-22 LAB — APTT: aPTT: 29 seconds (ref 24–37)

## 2013-06-22 NOTE — Progress Notes (Signed)
Primary - dr. Rogue Bussing Does not have cardiologist ekg in epic  Stopped eliquis on Tuesday night

## 2013-06-22 NOTE — Pre-Procedure Instructions (Signed)
[name] [name]  06/22/2013   Your procedure is scheduled on:  Monday, June 22nd  Report to [facility] Admitting at 1030 AM.  Call this number if you have problems the morning of surgery: [phone]   Remember:   Do not eat food or drink liquids after midnight.   Take these medicines the morning of surgery with A SIP OF WATER: lopressor, percocet if needed   Do not wear jewelry, make-up or nail polish.  Do not wear lotions, powders, or perfumes. You may wear deodorant.  Do not shave 48 hours prior to surgery. Men may shave face and neck.  Do not bring valuables to the hospital.  [facility] is not responsible for any belongings or valuables.               Contacts, dentures or bridgework may not be worn into surgery.  Leave suitcase in the car. After surgery it may be brought to your room.  For patients admitted to the hospital, discharge time is determined by your  treatment team.               Patients discharged the day of surgery will not be allowed to drive home.  Please read over the following fact sheets that you were given: Pain Booklet, Coughing and Deep Breathing and Surgical Site Infection Prevention [place] - Preparing for Surgery  Before surgery, you can play an important role.  Because skin is not sterile, your skin needs to be as free of germs as possible.  You can reduce the number of germs on you skin by washing with CHG (chlorahexidine gluconate) soap before surgery.  CHG is an antiseptic cleaner which kills germs and bonds with the skin to continue killing germs even after washing.  Please DO NOT use if you have an allergy to CHG or antibacterial soaps.  If your skin becomes reddened/irritated stop using the CHG and inform your nurse when you arrive at Short Stay.  Do not shave (including legs and underarms) for at least 48 hours prior to the first CHG shower.  You may shave your face.  Please follow these instructions carefully:   1.  Shower  with CHG Soap the night before surgery and the morning of Surgery.  2.  If you choose to wash your hair, wash your hair first as usual with your normal shampoo.  3.  After you shampoo, rinse your hair and body thoroughly to remove the shampoo.  4.  Use CHG as you would any other liquid soap.  You can apply CHG directly to the skin and wash gently with scrungie or a clean washcloth.  5.  Apply the CHG Soap to your body ONLY FROM THE NECK DOWN.  Do not use on open wounds or open sores.  Avoid contact with your eyes, ears, mouth and genitals (private parts).  Wash genitals (private parts) with your normal soap.  6.  Wash thoroughly, paying special attention to the area where your surgery will be performed.  7.  Thoroughly rinse your body with warm water from the neck down.  8.  DO NOT shower/wash with your normal soap after using and rinsing off the CHG Soap.  9.  Pat yourself dry with a clean towel.            10.  Wear clean pajamas.            11.  Place clean sheets on your bed the night of your  first shower and do not sleep with pets.  Day of Surgery  Do not apply any lotions/deoderants the morning of surgery.  Please wear clean clothes to the hospital/surgery center.

## 2013-06-25 ENCOUNTER — Encounter (HOSPITAL_COMMUNITY): Payer: Worker's Compensation | Admitting: Anesthesiology

## 2013-06-25 ENCOUNTER — Encounter (HOSPITAL_COMMUNITY): Payer: Self-pay | Admitting: Anesthesiology

## 2013-06-25 ENCOUNTER — Ambulatory Visit (HOSPITAL_COMMUNITY): Payer: Worker's Compensation | Admitting: Anesthesiology

## 2013-06-25 ENCOUNTER — Ambulatory Visit (HOSPITAL_COMMUNITY)
Admission: RE | Admit: 2013-06-25 | Discharge: 2013-06-26 | Disposition: A | Discharge status: Home / Self Care | Payer: Worker's Compensation | Source: Ambulatory Visit | Attending: Orthopedic Surgery | Admitting: Orthopedic Surgery

## 2013-06-25 ENCOUNTER — Encounter (HOSPITAL_COMMUNITY)
Admission: RE | Disposition: A | Discharge status: Home / Self Care | Payer: Self-pay | Source: Ambulatory Visit | Attending: Orthopedic Surgery

## 2013-06-25 DIAGNOSIS — E349 Endocrine disorder, unspecified: Secondary | ICD-10-CM | POA: Diagnosis present

## 2013-06-25 DIAGNOSIS — Z01818 Encounter for other preprocedural examination: Secondary | ICD-10-CM | POA: Insufficient documentation

## 2013-06-25 DIAGNOSIS — IMO0001 Reserved for inherently not codable concepts without codable children: Secondary | ICD-10-CM | POA: Insufficient documentation

## 2013-06-25 DIAGNOSIS — Z8781 Personal history of (healed) traumatic fracture: Secondary | ICD-10-CM | POA: Insufficient documentation

## 2013-06-25 DIAGNOSIS — M25669 Stiffness of unspecified knee, not elsewhere classified: Secondary | ICD-10-CM | POA: Insufficient documentation

## 2013-06-25 DIAGNOSIS — M898X9 Other specified disorders of bone, unspecified site: Secondary | ICD-10-CM | POA: Diagnosis present

## 2013-06-25 DIAGNOSIS — D62 Acute posthemorrhagic anemia: Secondary | ICD-10-CM | POA: Insufficient documentation

## 2013-06-25 DIAGNOSIS — E538 Deficiency of other specified B group vitamins: Secondary | ICD-10-CM | POA: Diagnosis present

## 2013-06-25 DIAGNOSIS — E291 Testicular hypofunction: Secondary | ICD-10-CM | POA: Diagnosis present

## 2013-06-25 DIAGNOSIS — Z86711 Personal history of pulmonary embolism: Secondary | ICD-10-CM | POA: Insufficient documentation

## 2013-06-25 DIAGNOSIS — M948X9 Other specified disorders of cartilage, unspecified sites: Secondary | ICD-10-CM | POA: Insufficient documentation

## 2013-06-25 DIAGNOSIS — Z01812 Encounter for preprocedural laboratory examination: Secondary | ICD-10-CM | POA: Insufficient documentation

## 2013-06-25 DIAGNOSIS — E539 Vitamin B deficiency, unspecified: Secondary | ICD-10-CM | POA: Insufficient documentation

## 2013-06-25 DIAGNOSIS — I10 Essential (primary) hypertension: Secondary | ICD-10-CM | POA: Insufficient documentation

## 2013-06-25 DIAGNOSIS — K5080 Crohn's disease of both small and large intestine without complications: Secondary | ICD-10-CM | POA: Diagnosis present

## 2013-06-25 DIAGNOSIS — E669 Obesity, unspecified: Secondary | ICD-10-CM | POA: Diagnosis present

## 2013-06-25 DIAGNOSIS — E559 Vitamin D deficiency, unspecified: Secondary | ICD-10-CM | POA: Diagnosis present

## 2013-06-25 DIAGNOSIS — Z7901 Long term (current) use of anticoagulants: Secondary | ICD-10-CM | POA: Insufficient documentation

## 2013-06-25 DIAGNOSIS — Y9229 Other specified public building as the place of occurrence of the external cause: Secondary | ICD-10-CM | POA: Insufficient documentation

## 2013-06-25 DIAGNOSIS — M24562 Contracture, left knee: Secondary | ICD-10-CM

## 2013-06-25 DIAGNOSIS — M24569 Contracture, unspecified knee: Secondary | ICD-10-CM | POA: Diagnosis present

## 2013-06-25 HISTORY — PX: LYSIS OF ADHESION: SHX5961

## 2013-06-25 HISTORY — PX: KNEE ARTHROSCOPY: SHX127

## 2013-06-25 LAB — CBC
HEMATOCRIT: 34.8 % — AB (ref 39.0–52.0)
HEMOGLOBIN: 11.1 g/dL — AB (ref 13.0–17.0)
MCH: 23.9 pg — AB (ref 26.0–34.0)
MCHC: 31.9 g/dL (ref 30.0–36.0)
MCV: 74.8 fL — AB (ref 78.0–100.0)
PLATELETS: 259 10*3/uL (ref 150–400)
RBC: 4.65 MIL/uL (ref 4.22–5.81)
RDW: 17.3 % — ABNORMAL HIGH (ref 11.5–15.5)
WBC: 7.8 10*3/uL (ref 4.0–10.5)

## 2013-06-25 LAB — CREATININE, SERUM
Creatinine, Ser: 1.04 mg/dL (ref 0.50–1.35)
GFR calc Af Amer: >90 mL/min (ref >90)
GFR calc non Af Amer: 88 mL/min — ABNORMAL LOW (ref >90)

## 2013-06-25 SURGERY — ARTHROSCOPY, KNEE
Anesthesia: General | Site: Knee | Laterality: Left

## 2013-06-25 MED ORDER — SODIUM CHLORIDE 0.9 % IR SOLN
Status: DC | PRN
  Administered 2013-06-25: 1

## 2013-06-25 MED ORDER — CHLORHEXIDINE GLUCONATE 4 % EX LIQD
60.0000 mL | Freq: Once | CUTANEOUS | Status: DC
  Filled 2013-06-25: qty 60

## 2013-06-25 MED ORDER — BUPIVACAINE HCL 0.5 % IJ SOLN
INTRAMUSCULAR | Status: DC | PRN
  Administered 2013-06-25: 10 mL

## 2013-06-25 MED ORDER — ENOXAPARIN SODIUM 30 MG/0.3ML ~~LOC~~ SOLN
30.0000 mg | Freq: Two times a day (BID) | SUBCUTANEOUS | Status: DC
  Administered 2013-06-26: 30 mg via SUBCUTANEOUS
  Filled 2013-06-25 (×3): qty 0.3

## 2013-06-25 MED ORDER — CEFAZOLIN SODIUM-DEXTROSE 2-3 GM-% IV SOLR
2.0000 g | Freq: Four times a day (QID) | INTRAVENOUS | Status: AC
  Administered 2013-06-25 – 2013-06-26 (×2): 2 g via INTRAVENOUS
  Filled 2013-06-25 (×2): qty 50

## 2013-06-25 MED ORDER — BUPIVACAINE-EPINEPHRINE (PF) 0.5% -1:200000 IJ SOLN
INTRAMUSCULAR | Status: AC
  Filled 2013-06-25: qty 30

## 2013-06-25 MED ORDER — METOPROLOL TARTRATE 12.5 MG HALF TABLET
12.5000 mg | ORAL_TABLET | Freq: Two times a day (BID) | ORAL | Status: DC
  Administered 2013-06-25 – 2013-06-26 (×2): 12.5 mg via ORAL
  Filled 2013-06-25 (×3): qty 1

## 2013-06-25 MED ORDER — ACETAMINOPHEN 650 MG RE SUPP: 650.0000 mg | Freq: Four times a day (QID) | RECTAL | Status: DC | PRN

## 2013-06-25 MED ORDER — CEFAZOLIN SODIUM-DEXTROSE 2-3 GM-% IV SOLR
INTRAVENOUS | Status: AC
  Administered 2013-06-25: 2 g via INTRAVENOUS
  Filled 2013-06-25: qty 50

## 2013-06-25 MED ORDER — ONDANSETRON HCL 4 MG PO TABS: 4.0000 mg | ORAL_TABLET | Freq: Four times a day (QID) | ORAL | Status: DC | PRN

## 2013-06-25 MED ORDER — METOCLOPRAMIDE HCL 5 MG/ML IJ SOLN
5.0000 mg | Freq: Three times a day (TID) | INTRAMUSCULAR | Status: DC | PRN
Start: 2013-06-25 — End: 2013-06-26

## 2013-06-25 MED ORDER — ALUM & MAG HYDROXIDE-SIMETH 200-200-20 MG/5ML PO SUSP: 30.0000 mL | ORAL | Status: DC | PRN

## 2013-06-25 MED ORDER — NEOSTIGMINE METHYLSULFATE 10 MG/10ML IV SOLN
INTRAVENOUS | Status: DC | PRN
  Administered 2013-06-25: 3 mg via INTRAVENOUS

## 2013-06-25 MED ORDER — GLYCOPYRROLATE 0.2 MG/ML IJ SOLN
INTRAMUSCULAR | Status: AC
  Filled 2013-06-25: qty 2

## 2013-06-25 MED ORDER — PROPOFOL 10 MG/ML IV BOLUS
INTRAVENOUS | Status: AC
  Filled 2013-06-25: qty 20

## 2013-06-25 MED ORDER — ACETAMINOPHEN 500 MG PO TABS
ORAL_TABLET | ORAL | Status: AC
  Filled 2013-06-25: qty 2

## 2013-06-25 MED ORDER — PHENYLEPHRINE HCL 10 MG/ML IJ SOLN
INTRAMUSCULAR | Status: DC | PRN
  Administered 2013-06-25 (×6): 80 ug via INTRAVENOUS

## 2013-06-25 MED ORDER — LACTATED RINGERS IV SOLN: INTRAVENOUS | Status: DC

## 2013-06-25 MED ORDER — HYDROMORPHONE HCL PF 1 MG/ML IJ SOLN
0.2500 mg | INTRAMUSCULAR | Status: DC | PRN
  Administered 2013-06-25: 0.5 mg via INTRAVENOUS

## 2013-06-25 MED ORDER — DOCUSATE SODIUM 100 MG PO CAPS
100.0000 mg | ORAL_CAPSULE | Freq: Two times a day (BID) | ORAL | Status: DC
  Administered 2013-06-25 – 2013-06-26 (×2): 100 mg via ORAL
  Filled 2013-06-25 (×3): qty 1

## 2013-06-25 MED ORDER — HYDROMORPHONE HCL PF 1 MG/ML IJ SOLN
1.0000 mg | INTRAMUSCULAR | Status: DC | PRN
  Administered 2013-06-25 – 2013-06-26 (×2): 1 mg via INTRAVENOUS
  Filled 2013-06-25 (×2): qty 1

## 2013-06-25 MED ORDER — METHOCARBAMOL 1000 MG/10ML IJ SOLN
500.0000 mg | Freq: Four times a day (QID) | INTRAVENOUS | Status: DC | PRN
  Filled 2013-06-25: qty 5

## 2013-06-25 MED ORDER — ONDANSETRON HCL 4 MG/2ML IJ SOLN: 4.0000 mg | Freq: Once | INTRAMUSCULAR | Status: DC | PRN

## 2013-06-25 MED ORDER — KETOROLAC TROMETHAMINE 15 MG/ML IJ SOLN
15.0000 mg | Freq: Four times a day (QID) | INTRAMUSCULAR | Status: DC
  Administered 2013-06-26 (×2): 15 mg via INTRAVENOUS
  Filled 2013-06-25 (×3): qty 1

## 2013-06-25 MED ORDER — ACETAMINOPHEN 500 MG PO TABS
1000.0000 mg | ORAL_TABLET | Freq: Three times a day (TID) | ORAL | Status: DC
  Administered 2013-06-25 – 2013-06-26 (×2): 1000 mg via ORAL
  Filled 2013-06-25 (×3): qty 2

## 2013-06-25 MED ORDER — ONDANSETRON HCL 4 MG/2ML IJ SOLN: 4.0000 mg | Freq: Four times a day (QID) | INTRAMUSCULAR | Status: DC | PRN

## 2013-06-25 MED ORDER — ACETAMINOPHEN 325 MG PO TABS: 650.0000 mg | ORAL_TABLET | Freq: Four times a day (QID) | ORAL | Status: DC | PRN

## 2013-06-25 MED ORDER — DIPHENHYDRAMINE HCL 12.5 MG/5ML PO ELIX: 12.5000 mg | ORAL_SOLUTION | ORAL | Status: DC | PRN

## 2013-06-25 MED ORDER — LIDOCAINE HCL (CARDIAC) 20 MG/ML IV SOLN
INTRAVENOUS | Status: DC | PRN
  Administered 2013-06-25: 100 mg via INTRAVENOUS

## 2013-06-25 MED ORDER — HYDROMORPHONE HCL PF 1 MG/ML IJ SOLN
INTRAMUSCULAR | Status: AC
  Filled 2013-06-25: qty 1

## 2013-06-25 MED ORDER — ROCURONIUM BROMIDE 100 MG/10ML IV SOLN
INTRAVENOUS | Status: DC | PRN
  Administered 2013-06-25: 20 mg via INTRAVENOUS

## 2013-06-25 MED ORDER — METOCLOPRAMIDE HCL 10 MG PO TABS: 5.0000 mg | ORAL_TABLET | Freq: Three times a day (TID) | ORAL | Status: DC | PRN

## 2013-06-25 MED ORDER — PHENOL 1.4 % MT LIQD: 1.0000 | OROMUCOSAL | Status: DC | PRN

## 2013-06-25 MED ORDER — LACTATED RINGERS IV SOLN
INTRAVENOUS | Status: DC
  Administered 2013-06-25: 11:00:00 via INTRAVENOUS

## 2013-06-25 MED ORDER — CEFAZOLIN SODIUM-DEXTROSE 2-3 GM-% IV SOLR: 2.0000 g | INTRAVENOUS | Status: DC

## 2013-06-25 MED ORDER — LACTATED RINGERS IV SOLN
INTRAVENOUS | Status: DC
  Administered 2013-06-25 – 2013-06-26 (×2): via INTRAVENOUS

## 2013-06-25 MED ORDER — ARTIFICIAL TEARS OP OINT
TOPICAL_OINTMENT | OPHTHALMIC | Status: AC
  Filled 2013-06-25: qty 3.5

## 2013-06-25 MED ORDER — PROPOFOL 10 MG/ML IV BOLUS
INTRAVENOUS | Status: DC | PRN
  Administered 2013-06-25: 200 mg via INTRAVENOUS
  Administered 2013-06-25: 140 mg via INTRAVENOUS
  Administered 2013-06-25: 60 mg via INTRAVENOUS

## 2013-06-25 MED ORDER — MENTHOL 3 MG MT LOZG: 1.0000 | LOZENGE | OROMUCOSAL | Status: DC | PRN

## 2013-06-25 MED ORDER — ACETAMINOPHEN 500 MG PO TABS
1000.0000 mg | ORAL_TABLET | Freq: Once | ORAL | Status: AC
Start: 2013-06-25 — End: 2013-06-25
  Administered 2013-06-25: 1000 mg via ORAL

## 2013-06-25 MED ORDER — ROCURONIUM BROMIDE 50 MG/5ML IV SOLN
INTRAVENOUS | Status: AC
  Filled 2013-06-25: qty 1

## 2013-06-25 MED ORDER — OXYCODONE HCL 5 MG PO TABS
5.0000 mg | ORAL_TABLET | ORAL | Status: DC | PRN
  Administered 2013-06-25 – 2013-06-26 (×5): 10 mg via ORAL
  Filled 2013-06-25 (×5): qty 2

## 2013-06-25 MED ORDER — METHOCARBAMOL 500 MG PO TABS
500.0000 mg | ORAL_TABLET | Freq: Four times a day (QID) | ORAL | Status: DC | PRN
  Administered 2013-06-25: 500 mg via ORAL
  Administered 2013-06-26: 1000 mg via ORAL
  Filled 2013-06-25: qty 2
  Filled 2013-06-25: qty 1

## 2013-06-25 MED ORDER — GLYCOPYRROLATE 0.2 MG/ML IJ SOLN
INTRAMUSCULAR | Status: DC | PRN
  Administered 2013-06-25: 0.4 mg via INTRAVENOUS

## 2013-06-25 MED ORDER — FENTANYL CITRATE 0.05 MG/ML IJ SOLN
INTRAMUSCULAR | Status: AC
  Filled 2013-06-25: qty 5

## 2013-06-25 MED ORDER — LACTATED RINGERS IV SOLN
INTRAVENOUS | Status: DC | PRN
  Administered 2013-06-25 (×2): via INTRAVENOUS

## 2013-06-25 MED ORDER — BUPIVACAINE HCL (PF) 0.25 % IJ SOLN
INTRAMUSCULAR | Status: AC
  Filled 2013-06-25: qty 30

## 2013-06-25 MED ORDER — MIDAZOLAM HCL 5 MG/5ML IJ SOLN
INTRAMUSCULAR | Status: DC | PRN
  Administered 2013-06-25 (×2): 1 mg via INTRAVENOUS

## 2013-06-25 MED ORDER — MIDAZOLAM HCL 2 MG/2ML IJ SOLN
INTRAMUSCULAR | Status: AC
  Filled 2013-06-25: qty 2

## 2013-06-25 MED ORDER — ONDANSETRON HCL 4 MG/2ML IJ SOLN
INTRAMUSCULAR | Status: AC
  Filled 2013-06-25: qty 2

## 2013-06-25 MED ORDER — ACETAMINOPHEN 500 MG PO TABS: 1000.0000 mg | ORAL_TABLET | Freq: Three times a day (TID) | ORAL | Status: DC

## 2013-06-25 MED ORDER — ONDANSETRON HCL 4 MG/2ML IJ SOLN
INTRAMUSCULAR | Status: DC | PRN
  Administered 2013-06-25: 4 mg via INTRAVENOUS

## 2013-06-25 MED ORDER — FENTANYL CITRATE 0.05 MG/ML IJ SOLN
INTRAMUSCULAR | Status: DC | PRN
  Administered 2013-06-25 (×2): 50 ug via INTRAVENOUS
  Administered 2013-06-25: 100 ug via INTRAVENOUS
  Administered 2013-06-25: 50 ug via INTRAVENOUS

## 2013-06-25 MED ORDER — NEOSTIGMINE METHYLSULFATE 10 MG/10ML IV SOLN
INTRAVENOUS | Status: AC
  Filled 2013-06-25: qty 1

## 2013-06-25 MED ORDER — SUCCINYLCHOLINE CHLORIDE 20 MG/ML IJ SOLN
INTRAMUSCULAR | Status: DC | PRN
  Administered 2013-06-25: 80 mg via INTRAVENOUS

## 2013-06-25 SURGICAL SUPPLY — 42 items
BANDAGE ELASTIC 6 VELCRO ST LF (GAUZE/BANDAGES/DRESSINGS) ×3 IMPLANT
BANDAGE ESMARK 6X9 LF (GAUZE/BANDAGES/DRESSINGS) ×1 IMPLANT
BLADE CUDA 5.5 (BLADE) IMPLANT
BLADE GREAT WHITE 4.2 (BLADE) ×2 IMPLANT
BLADE GREAT WHITE 4.2MM (BLADE) ×1
BLADE SURG 11 STRL SS (BLADE) ×3 IMPLANT
BNDG ESMARK 6X9 LF (GAUZE/BANDAGES/DRESSINGS) ×3
BRUSH SCRUB DISP (MISCELLANEOUS) ×6 IMPLANT
COVER SURGICAL LIGHT HANDLE (MISCELLANEOUS) ×6 IMPLANT
CUFF TOURNIQUET SINGLE 34IN LL (TOURNIQUET CUFF) IMPLANT
CUFF TOURNIQUET SINGLE 44IN (TOURNIQUET CUFF) ×3 IMPLANT
DRAPE ARTHROSCOPY W/POUCH 114 (DRAPES) ×3 IMPLANT
DRAPE U-SHAPE 47X51 STRL (DRAPES) ×3 IMPLANT
DRSG EMULSION OIL 3X3 NADH (GAUZE/BANDAGES/DRESSINGS) ×3 IMPLANT
DRSG PAD ABDOMINAL 8X10 ST (GAUZE/BANDAGES/DRESSINGS) ×3 IMPLANT
GAUZE SPONGE 4X4 16PLY XRAY LF (GAUZE/BANDAGES/DRESSINGS) ×3 IMPLANT
GLOVE BIO SURGEON STRL SZ7.5 (GLOVE) ×3 IMPLANT
GLOVE BIO SURGEON STRL SZ8 (GLOVE) ×3 IMPLANT
GLOVE BIOGEL PI IND STRL 7.5 (GLOVE) ×1 IMPLANT
GLOVE BIOGEL PI IND STRL 8 (GLOVE) ×1 IMPLANT
GLOVE BIOGEL PI INDICATOR 7.5 (GLOVE) ×2
GLOVE BIOGEL PI INDICATOR 8 (GLOVE) ×2
GOWN STRL REUS W/ TWL LRG LVL3 (GOWN DISPOSABLE) ×2 IMPLANT
GOWN STRL REUS W/ TWL XL LVL3 (GOWN DISPOSABLE) ×1 IMPLANT
GOWN STRL REUS W/TWL LRG LVL3 (GOWN DISPOSABLE) ×4
GOWN STRL REUS W/TWL XL LVL3 (GOWN DISPOSABLE) ×2
KIT BASIN OR (CUSTOM PROCEDURE TRAY) ×3 IMPLANT
KIT ROOM TURNOVER OR (KITS) ×3 IMPLANT
MANIFOLD NEPTUNE II (INSTRUMENTS) ×3 IMPLANT
PACK ARTHROSCOPY DSU (CUSTOM PROCEDURE TRAY) ×3 IMPLANT
PAD ARMBOARD 7.5X6 YLW CONV (MISCELLANEOUS) ×6 IMPLANT
PADDING CAST COTTON 6X4 STRL (CAST SUPPLIES) ×6 IMPLANT
SET ARTHROSCOPY TUBING (MISCELLANEOUS) ×2
SET ARTHROSCOPY TUBING LN (MISCELLANEOUS) ×1 IMPLANT
SPONGE GAUZE 4X4 12PLY (GAUZE/BANDAGES/DRESSINGS) ×3 IMPLANT
SPONGE GAUZE 4X4 12PLY STER LF (GAUZE/BANDAGES/DRESSINGS) ×3 IMPLANT
SUT ETHILON 4 0 PS 2 18 (SUTURE) ×3 IMPLANT
TOWEL OR 17X24 6PK STRL BLUE (TOWEL DISPOSABLE) ×6 IMPLANT
TUBE CONNECTING 12'X1/4 (SUCTIONS) ×1
TUBE CONNECTING 12X1/4 (SUCTIONS) ×2 IMPLANT
WAND HAND CNTRL MULTIVAC 90 (MISCELLANEOUS) ×3 IMPLANT
WATER STERILE IRR 1000ML POUR (IV SOLUTION) ×3 IMPLANT

## 2013-06-25 NOTE — Progress Notes (Signed)
Orthopedic Tech Progress Note Patient Details:  [name] [name] [birth year] [id number]  CPM Left Knee CPM Left Knee: On Left Knee Flexion (Degrees): 90 Left Knee Extension (Degrees): 0 Additional Comments: Trapeze bar foot roll   Irish Elders 06/25/2013, 4:20 PM

## 2013-06-25 NOTE — Brief Op Note (Signed)
06/25/2013  3:14 PM  PATIENT:  [name] [name]  43 y.o. male  PRE-OPERATIVE DIAGNOSIS:  LEFT KNEE CONTRACTURE  POST-OPERATIVE DIAGNOSIS:  same  PROCEDURE:  Procedure(s): 1. LYSIS OF ADHESION LEFT KNEE (Left), 15-30 degrees 2. Manipulation under anesthesia, 10-100 degrees  SURGEON:  Surgeon(s) and Role:    * Rozanna Box, MD - Primary  ANESTHESIA:   general plus femoral nerve block  I/O:  Total I/O In: 1000 [I.V.:1000] Out: 25 [Blood:25]  SPECIMEN:  No Specimen  TOURNIQUET:   Total Tourniquet Time Documented: area (laterality) - 70 minutes Total: area (laterality) - 70 minutes   DICTATION: .Other Dictation: Dictation Number [id number]

## 2013-06-25 NOTE — Anesthesia Postprocedure Evaluation (Signed)
Anesthesia Post Note  Patient: [name] [name]  Procedure(s) Performed: Procedure(s) (LRB): LYSIS OF ADHESION LEFT KNEE (Left)  Anesthesia type: General  Patient location: PACU  Post pain: Pain level controlled and Adequate analgesia  Post assessment: Post-op Vital signs reviewed, Patient's Cardiovascular Status Stable, Respiratory Function Stable, Patent Airway and Pain level controlled  Last Vitals:  Filed Vitals:   06/25/13 1503  BP: 108/64  Pulse: 77  Temp: 36.3 C  Resp: 22    Post vital signs: Reviewed and stable  Level of consciousness: awake, alert  and oriented  Complications: No apparent anesthesia complications

## 2013-06-25 NOTE — H&P (Signed)
I have seen and examined the patient. I agree with the findings above.  I discussed with the patient the risks and benefits of surgery for his left knee, including the possibility of recurrence of contracture, infection, nerve injury, vessel injury, wound breakdown, arthritis, DVT/ PE, refracture, and need for further surgery among others.  He understood these risks and wished to proceed.  Rozanna Box, MD 06/25/2013

## 2013-06-25 NOTE — Progress Notes (Signed)
Orthopedic Tech Progress Note Patient Details:  [name] [name] 07-Jun-1970 [id number] Off cpm at 9:47 pm  Patient ID: [name] [name], male   DOB: October 10, 1970, 43 y.o.   MRN: [id number]   [name] 06/25/2013, 9:47 PM

## 2013-06-25 NOTE — Anesthesia Preprocedure Evaluation (Addendum)
Anesthesia Evaluation  Patient identified by MRN, date of birth, ID band Patient awake    Reviewed: Allergy & Precautions, H&P , NPO status , Patient's Chart, lab work & pertinent test results  Airway       Dental   Pulmonary          Cardiovascular hypertension,     Neuro/Psych    GI/Hepatic   Endo/Other  Morbid obesity  Renal/GU      Musculoskeletal   Abdominal   Peds  Hematology   Anesthesia Other Findings Crohn"s   Reproductive/Obstetrics                          Anesthesia Physical Anesthesia Plan  ASA: III  Anesthesia Plan: General   Post-op Pain Management:    Induction: Intravenous  Airway Management Planned: Oral ETT and LMA  Additional Equipment:   Intra-op Plan:   Post-operative Plan: Extubation in OR  Informed Consent: I have reviewed the patients History and Physical, chart, labs and discussed the procedure including the risks, benefits and alternatives for the proposed anesthesia with the patient or authorized representative who has indicated his/her understanding and acceptance.     Plan Discussed with:   Anesthesia Plan Comments:         Anesthesia Quick Evaluation

## 2013-06-25 NOTE — Transfer of Care (Signed)
Immediate Anesthesia Transfer of Care Note  Patient: [name] [name]  Procedure(s) Performed: Procedure(s): LYSIS OF ADHESION LEFT KNEE (Left)  Patient Location: PACU  Anesthesia Type:GA combined with regional for post-op pain  Level of Consciousness: awake, alert  and oriented  Airway & Oxygen Therapy: Patient Spontanous Breathing and Patient connected to face mask oxygen  Post-op Assessment: Report given to PACU RN  Post vital signs: Reviewed and stable  Complications: No apparent anesthesia complications

## 2013-06-25 NOTE — Anesthesia Procedure Notes (Signed)
Anesthesia Regional Block:  Femoral nerve block  Pre-Anesthetic Checklist: ,, timeout performed, Correct Patient, Correct Site, Correct Laterality, Correct Procedure, Correct Position, site marked, Risks and benefits discussed,  Surgical consent,  Pre-op evaluation,  At surgeon's request and post-op pain management  Laterality: Left  Prep: Maximum Sterile Barrier Precautions used, chloraprep and alcohol swabs       Needles:  Injection technique: Single-shot  Needle Type: Stimulator Needle - 80        Needle insertion depth: 5 cm   Additional Needles:  Procedures: nerve stimulator Femoral nerve block  Nerve Stimulator or Paresthesia:  Response: 0.5 mA, 0.1 ms, 5 cm  Additional Responses:   Narrative:  Start time: 06/25/2013 1:00 PM End time: 06/25/2013 1:05 PM Injection made incrementally with aspirations every 5 mL.  Performed by: Personally  Anesthesiologist: Sharolyn Douglas MD  Additional Notes: PT accepts procedure w/ risks w/o questions. After induction, pt prepped in sterile fashion as above. Femerol N. Stimulated by needle .20cc 0.5% Marcaine w/ epi w/o difficulty. Pt tolerated well. GES
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
## 2013-06-26 ENCOUNTER — Ambulatory Visit: Payer: Worker's Compensation | Admitting: Physical Therapy

## 2013-06-26 ENCOUNTER — Encounter (HOSPITAL_COMMUNITY): Payer: Self-pay | Admitting: General Practice

## 2013-06-26 LAB — VITAMIN D 25 HYDROXY (VIT D DEFICIENCY, FRACTURES): Vit D, 25-Hydroxy: 43 ng/mL (ref 30–89)

## 2013-06-26 MED ORDER — DSS 100 MG PO CAPS: 100.0000 mg | ORAL_CAPSULE | Freq: Two times a day (BID) | ORAL | Status: DC

## 2013-06-26 MED ORDER — METHOCARBAMOL 500 MG PO TABS: 500.0000 mg | ORAL_TABLET | Freq: Four times a day (QID) | ORAL | Status: DC | PRN

## 2013-06-26 MED ORDER — OXYCODONE HCL 5 MG PO TABS: 5.0000 mg | ORAL_TABLET | ORAL | Status: DC | PRN

## 2013-06-26 MED ORDER — OXYCODONE-ACETAMINOPHEN 5-325 MG PO TABS: 1.0000 | ORAL_TABLET | Freq: Three times a day (TID) | ORAL | Status: DC | PRN

## 2013-06-26 NOTE — Clinical Social Work Note (Signed)
Referred to CSW today for ?SNF. Chart reviewed and have spoken with [facility] and Care Coordinator who indicate patient plans to d/c home with [facility] and DME. CSW to sign off- please contact us if SW needs arise. Eduard Clos, MSW, [place]

## 2013-06-26 NOTE — Discharge Instructions (Signed)
Orthopaedic Trauma Service Discharge Instructions   General Discharge Instructions  WEIGHT BEARING STATUS: Weightbearing as tolerated Left leg  RANGE OF MOTION/ACTIVITY: aggressive knee range of motion L knee. CPM 6-8 hours a day. Ok to break up into 2-3 hour sessions.  outpt PT as discussed L knee ROM- AROM, PROM. Prone exercises as well. No ROM restrictions.  Quad sets, SLR, LAQ, SAQ, heel slides, stretching, prone flexion and extension  Diet: as you were eating previously.  Can use over the counter stool softeners and bowel preparations, such as Miralax, to help with bowel movements.  Narcotics can be constipating.  Be sure to drink plenty of fluids  STOP SMOKING OR USING NICOTINE PRODUCTS!!!!  As discussed nicotine severely impairs your body's ability to heal surgical and traumatic wounds but also impairs bone healing.  Wounds and bone heal by forming microscopic blood vessels (angiogenesis) and nicotine is a vasoconstrictor (essentially, shrinks blood vessels).  Therefore, if vasoconstriction occurs to these microscopic blood vessels they essentially disappear and are unable to deliver necessary nutrients to the healing tissue.  This is one modifiable factor that you can do to dramatically increase your chances of healing your injury.    (This means no smoking, no nicotine gum, patches, etc)  DO NOT USE NONSTEROIDAL ANTI-INFLAMMATORY DRUGS (NSAID'S)  Using products such as Advil (ibuprofen), Aleve (naproxen), Motrin (ibuprofen) for additional pain control during fracture healing can delay and/or prevent the healing response.  If you would like to take over the counter (OTC) medication, Tylenol (acetaminophen) is ok.  However, some narcotic medications that are given for pain control contain acetaminophen as well. Therefore, you should not exceed more than 4000 mg of tylenol in a day if you do not have liver disease.  Also note that there are may OTC medicines, such as cold medicines and  allergy medicines that my contain tylenol as well.  If you have any questions about medications and/or interactions please ask your doctor/PA or your pharmacist.   PAIN MEDICATION USE AND EXPECTATIONS  You have likely been given narcotic medications to help control your pain.  After a traumatic event that results in an fracture (broken bone) with or without surgery, it is ok to use narcotic pain medications to help control one's pain.  We understand that everyone responds to pain differently and each individual patient will be evaluated on a regular basis for the continued need for narcotic medications. Ideally, narcotic medication use should last no more than 6-8 weeks (coinciding with fracture healing).   As a patient it is your responsibility as well to monitor narcotic medication use and report the amount and frequency you use these medications when you come to your office visit.   We would also advise that if you are using narcotic medications, you should take a dose prior to therapy to maximize you participation.  IF YOU ARE ON NARCOTIC MEDICATIONS IT IS NOT PERMISSIBLE TO OPERATE A MOTOR VEHICLE (MOTORCYCLE/CAR/TRUCK/MOPED) OR HEAVY MACHINERY DO NOT MIX NARCOTICS WITH OTHER CNS (CENTRAL NERVOUS SYSTEM) DEPRESSANTS SUCH AS ALCOHOL       ICE AND ELEVATE INJURED/OPERATIVE EXTREMITY  Using ice and elevating the injured extremity above your heart can help with swelling and pain control.  Icing in a pulsatile fashion, such as 20 minutes on and 20 minutes off, can be followed.    Do not place ice directly on skin. Make sure there is a barrier between to skin and the ice pack.    Using frozen items such as  frozen peas works well as the conform nicely to the are that needs to be iced.  USE AN ACE WRAP OR TED HOSE FOR SWELLING CONTROL  In addition to icing and elevation, Ace wraps or TED hose are used to help limit and resolve swelling.  It is recommended to use Ace wraps or TED hose until you are  informed to stop.    When using Ace Wraps start the wrapping distally (farthest away from the body) and wrap proximally (closer to the body)   Example: If you had surgery on your leg or thing and you do not have a splint on, start the ace wrap at the toes and work your way up to the thigh        If you had surgery on your upper extremity and do not have a splint on, start the ace wrap at your fingers and work your way up to the upper arm  IF YOU ARE IN A SPLINT OR CAST DO NOT [place]   If your splint gets wet for any reason please contact the office immediately. You may shower in your splint or cast as long as you keep it dry.  This can be done by wrapping in a cast cover or garbage back (or similar)  Do Not stick any thing down your splint or cast such as pencils, money, or hangers to try and scratch yourself with.  If you feel itchy take benadryl as prescribed on the bottle for itching  IF YOU ARE IN A CAM BOOT (BLACK BOOT)  You may remove boot periodically. Perform daily dressing changes as noted below.  Wash the liner of the boot regularly and wear a sock when wearing the boot. It is recommended that you sleep in the boot until told otherwise  CALL THE OFFICE WITH ANY QUESTIONS OR CONCERTS: [phone]     Discharge Pin Site Instructions  Dress pins daily with Kerlix roll starting on POD 2. Wrap the Kerlix so that it tamps the skin down around the pin-skin interface to prevent/limit motion of the skin relative to the pin.  (Pin-skin motion is the primary cause of pain and infection related to external fixator pin sites).  Remove any crust or coagulum that may obstruct drainage with a saline moistened gauze or soap and water.  After POD 3, if there is no discernable drainage on the pin site dressing, the interval for change can by increased to every other day.  You may shower with the fixator, cleaning all pin sites gently with soap and water.  If you have a surgical  wound this needs to be completely dry and without drainage before showering.  The extremity can be lifted by the fixator to facilitate wound care and transfers.  Notify the office/Doctor if you experience increasing drainage, redness, or pain from a pin site, or if you notice purulent (thick, snot-like) drainage.  Discharge Wound Care Instructions  Do NOT apply any ointments, solutions or lotions to pin sites or surgical wounds.  These prevent needed drainage and even though solutions like hydrogen peroxide kill bacteria, they also damage cells lining the pin sites that help fight infection.  Applying lotions or ointments can keep the wounds moist and can cause them to breakdown and open up as well. This can increase the risk for infection. When in doubt call the office.  Surgical incisions should be dressed daily.  If any drainage is noted, use one layer of adaptic, then gauze,  Kerlix, and an ace wrap.  Once the incision is completely dry and without drainage, it may be left open to air out.  Showering may begin 36-48 hours later.  Cleaning gently with soap and water.  Traumatic wounds should be dressed daily as well.    One layer of adaptic, gauze, Kerlix, then ace wrap.  The adaptic can be discontinued once the draining has ceased    If you have a wet to dry dressing: wet the gauze with saline the squeeze as much saline out so the gauze is moist (not soaking wet), place moistened gauze over wound, then place a dry gauze over the moist one, followed by Kerlix wrap, then ace wrap.
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
## 2013-06-26 NOTE — Care Management Note (Signed)
CARE MANAGEMENT NOTE 06/26/2013  Patient:  [name] [name], [name] [name]   Account Number:  [id number]  Date Initiated:  06/26/2013  Documentation initiated by:  Ricki Miller  Subjective/Objective Assessment:   44 yr old male s/p lysis of adhesions of left knee     Action/Plan:   Patient has all necesary DME. Worker's comp has arranged outpatient therapy. No needs for Case manager at this time.   Anticipated DC Date:  06/26/2013   Anticipated DC Plan:  [place]  CM consult      PAC Choice  NA   Choice offered to / List presented to:  NA   DME arranged  NA        HH arranged  NA      Status of service:  Completed, signed off Medicare Important Message given?   (If response is "NO", the following Medicare IM given date fields will be blank) Date Medicare IM given:   Date Additional Medicare IM given:    Discharge Disposition:  HOME/SELF CARE

## 2013-06-26 NOTE — Progress Notes (Signed)
Orthopaedic Trauma Service Progress Note  Subjective  Doing well Was in CPM for about 6 hours yesterday Has CPM at home and therapy is set up for Thursday No new issues  Denies CP or SOB No n/v/d     Objective   BP 113/67  Pulse 81  Temp(Src) 97.3 F (36.3 C) (Oral)  Resp 16  Wt 91.627 kg (202 lb)  SpO2 98%  Intake/Output     06/22 0701 - 06/23 0700 06/23 0701 - 06/24 0700   P.O.  120   I.V. (mL/kg) 1000 (10.9) 914.1 (10)   Total Intake(mL/kg) 1000 (10.9) 1034.1 (11.3)   Urine (mL/kg/hr) 200    Blood 25    Total Output 225     Net +775 +1034.1        Urine Occurrence 2 x      Labs  Results for LEOCADIO, HEAL (MRN [id number]) as of 06/26/2013 10:41  Ref. Range 06/25/2013 11:11  Vit D, 25-Hydroxy Latest Range: 30-89 ng/mL 43    Exam  Gen: resting comfortably in bed, NAD Lungs: clear anterior fields Cardiac: RRR, S1 and S2 Abd: + BS, NTND Ext:       Left Lower Extremity   Dressing c/d/i  Ext warm  + DP pulse  No DCT  Compartments soft and NT  Distal motor and sensory functions grossly intact     Assessment and Plan   POD/HD#: 1   1. L knee contracture s/p ORIF L tibial plateau fx  Aggressive knee ROM   PROM, A/AROM   CPM 6-8 hours a day    PT eval before dc     Please work on PROM    Instruct on HEP- no restrictions   WBAT with crutches  Continue with ice and elevation   Dressing change in 2 days    2. Pain management:  Percocet   Robaxin  3. ABL anemia/Hemodynamics  Stable  4. Medical issues     Improved vitamin D following supplementation for last 3 months  Will recheck Testosterone as outpt, refer to [facility] if needed   5. DVT/PE prophylaxis:  lovenox as inpt  Resume eliquis x 3 months at discharge  6. ID:   Received periop abx   7. Dispo:  Dc home today  Aggressive ROM     Jari Pigg, PA-C [facility] [phone] (P) 06/26/2013 10:30 AM  **Disclaimer: This note may have been dictated with  voice recognition software. Similar sounding words can inadvertently be transcribed and this note may contain transcription errors which may not have been corrected upon publication of note.**

## 2013-06-26 NOTE — Progress Notes (Signed)
Patient d/c to home, IV removed, prescriptions given, instructions reviewed.

## 2013-06-27 ENCOUNTER — Encounter: Payer: Self-pay | Admitting: Physical Therapy

## 2013-06-27 ENCOUNTER — Encounter: Payer: Worker's Compensation | Admitting: Physical Medicine & Rehabilitation

## 2013-06-28 ENCOUNTER — Ambulatory Visit: Payer: BC Managed Care – PPO | Attending: Nurse Practitioner | Admitting: Physical Therapy

## 2013-06-28 DIAGNOSIS — IMO0001 Reserved for inherently not codable concepts without codable children: Secondary | ICD-10-CM | POA: Insufficient documentation

## 2013-06-28 DIAGNOSIS — M25669 Stiffness of unspecified knee, not elsewhere classified: Secondary | ICD-10-CM | POA: Insufficient documentation

## 2013-06-28 DIAGNOSIS — R609 Edema, unspecified: Secondary | ICD-10-CM | POA: Insufficient documentation

## 2013-06-28 DIAGNOSIS — R269 Unspecified abnormalities of gait and mobility: Secondary | ICD-10-CM | POA: Diagnosis not present

## 2013-06-28 DIAGNOSIS — M25569 Pain in unspecified knee: Secondary | ICD-10-CM | POA: Insufficient documentation

## 2013-06-28 DIAGNOSIS — M6281 Muscle weakness (generalized): Secondary | ICD-10-CM | POA: Diagnosis not present

## 2013-06-28 DIAGNOSIS — R262 Difficulty in walking, not elsewhere classified: Secondary | ICD-10-CM | POA: Insufficient documentation

## 2013-06-28 DIAGNOSIS — R5381 Other malaise: Secondary | ICD-10-CM | POA: Diagnosis not present

## 2013-06-29 ENCOUNTER — Ambulatory Visit: Payer: BC Managed Care – PPO | Admitting: Physical Therapy

## 2013-07-01 LAB — VITAMIN D 1,25 DIHYDROXY
VITAMIN D2 1, 25 (OH): 56 pg/mL
Vitamin D 1, 25 (OH)2 Total: 64 pg/mL (ref 18–72)
Vitamin D3 1, 25 (OH)2: 8 pg/mL

## 2013-07-02 ENCOUNTER — Ambulatory Visit: Payer: Worker's Compensation | Admitting: Physical Therapy

## 2013-07-03 ENCOUNTER — Ambulatory Visit: Payer: Worker's Compensation

## 2013-07-04 ENCOUNTER — Ambulatory Visit: Payer: Worker's Compensation | Attending: Orthopedic Surgery | Admitting: Physical Therapy

## 2013-07-04 DIAGNOSIS — R269 Unspecified abnormalities of gait and mobility: Secondary | ICD-10-CM | POA: Insufficient documentation

## 2013-07-04 DIAGNOSIS — M25569 Pain in unspecified knee: Secondary | ICD-10-CM | POA: Insufficient documentation

## 2013-07-04 DIAGNOSIS — R5381 Other malaise: Secondary | ICD-10-CM | POA: Insufficient documentation

## 2013-07-04 DIAGNOSIS — M25669 Stiffness of unspecified knee, not elsewhere classified: Secondary | ICD-10-CM | POA: Insufficient documentation

## 2013-07-04 DIAGNOSIS — Z5189 Encounter for other specified aftercare: Secondary | ICD-10-CM | POA: Insufficient documentation

## 2013-07-04 DIAGNOSIS — R609 Edema, unspecified: Secondary | ICD-10-CM | POA: Insufficient documentation

## 2013-07-04 DIAGNOSIS — R262 Difficulty in walking, not elsewhere classified: Secondary | ICD-10-CM | POA: Insufficient documentation

## 2013-07-04 DIAGNOSIS — M6281 Muscle weakness (generalized): Secondary | ICD-10-CM | POA: Insufficient documentation

## 2013-07-05 ENCOUNTER — Emergency Department (HOSPITAL_COMMUNITY): Payer: Worker's Compensation

## 2013-07-05 ENCOUNTER — Ambulatory Visit: Payer: Worker's Compensation

## 2013-07-05 ENCOUNTER — Encounter (HOSPITAL_COMMUNITY): Payer: Self-pay | Admitting: Emergency Medicine

## 2013-07-05 ENCOUNTER — Emergency Department (HOSPITAL_COMMUNITY)
Admission: EM | Admit: 2013-07-05 | Discharge: 2013-07-05 | Disposition: A | Discharge status: Home / Self Care | Payer: Worker's Compensation | Attending: Emergency Medicine | Admitting: Emergency Medicine

## 2013-07-05 DIAGNOSIS — R0602 Shortness of breath: Secondary | ICD-10-CM | POA: Insufficient documentation

## 2013-07-05 DIAGNOSIS — Z8739 Personal history of other diseases of the musculoskeletal system and connective tissue: Secondary | ICD-10-CM | POA: Insufficient documentation

## 2013-07-05 DIAGNOSIS — I10 Essential (primary) hypertension: Secondary | ICD-10-CM | POA: Insufficient documentation

## 2013-07-05 DIAGNOSIS — Z8639 Personal history of other endocrine, nutritional and metabolic disease: Secondary | ICD-10-CM | POA: Insufficient documentation

## 2013-07-05 DIAGNOSIS — J029 Acute pharyngitis, unspecified: Secondary | ICD-10-CM | POA: Insufficient documentation

## 2013-07-05 DIAGNOSIS — Z79899 Other long term (current) drug therapy: Secondary | ICD-10-CM | POA: Insufficient documentation

## 2013-07-05 DIAGNOSIS — Z86711 Personal history of pulmonary embolism: Secondary | ICD-10-CM | POA: Insufficient documentation

## 2013-07-05 DIAGNOSIS — F411 Generalized anxiety disorder: Secondary | ICD-10-CM | POA: Insufficient documentation

## 2013-07-05 DIAGNOSIS — Z862 Personal history of diseases of the blood and blood-forming organs and certain disorders involving the immune mechanism: Secondary | ICD-10-CM | POA: Insufficient documentation

## 2013-07-05 DIAGNOSIS — Z8719 Personal history of other diseases of the digestive system: Secondary | ICD-10-CM | POA: Insufficient documentation

## 2013-07-05 LAB — CBC
HEMATOCRIT: 37.7 % — AB (ref 39.0–52.0)
HEMOGLOBIN: 12.3 g/dL — AB (ref 13.0–17.0)
MCH: 24.3 pg — ABNORMAL LOW (ref 26.0–34.0)
MCHC: 32.6 g/dL (ref 30.0–36.0)
MCV: 74.5 fL — ABNORMAL LOW (ref 78.0–100.0)
Platelets: 427 10*3/uL — ABNORMAL HIGH (ref 150–400)
RBC: 5.06 MIL/uL (ref 4.22–5.81)
RDW: 17.2 % — AB (ref 11.5–15.5)
WBC: 7.5 10*3/uL (ref 4.0–10.5)

## 2013-07-05 LAB — BASIC METABOLIC PANEL
Anion gap: 17 — ABNORMAL HIGH (ref 5–15)
BUN: 10 mg/dL (ref 6–23)
CALCIUM: 9.8 mg/dL (ref 8.4–10.5)
CO2: 25 mEq/L (ref 19–32)
Chloride: 98 mEq/L (ref 96–112)
Creatinine, Ser: 1.09 mg/dL (ref 0.50–1.35)
GFR calc Af Amer: >90 mL/min (ref >90)
GFR, EST NON AFRICAN AMERICAN: 83 mL/min — AB (ref >90)
GLUCOSE: 114 mg/dL — AB (ref 70–99)
Potassium: 3.6 mEq/L — ABNORMAL LOW (ref 3.7–5.3)
Sodium: 140 mEq/L (ref 137–147)

## 2013-07-05 LAB — I-STAT TROPONIN, ED
Troponin i, poc: 0 ng/mL (ref 0.00–0.08)
Troponin i, poc: 0 ng/mL (ref 0.00–0.08)

## 2013-07-05 LAB — PRO B NATRIURETIC PEPTIDE: Pro B Natriuretic peptide (BNP): 19.8 pg/mL (ref 0–125)

## 2013-07-05 MED ORDER — LORAZEPAM 2 MG/ML IJ SOLN
1.0000 mg | Freq: Once | INTRAMUSCULAR | Status: AC
  Administered 2013-07-05: 1 mg via INTRAVENOUS
  Filled 2013-07-05: qty 1

## 2013-07-05 MED ORDER — LORAZEPAM 0.5 MG PO TABS: 0.5000 mg | ORAL_TABLET | Freq: Three times a day (TID) | ORAL | Status: DC | PRN

## 2013-07-05 MED ORDER — IOHEXOL 350 MG/ML SOLN
100.0000 mL | Freq: Once | INTRAVENOUS | Status: AC | PRN
  Administered 2013-07-05: 100 mL via INTRAVENOUS

## 2013-07-05 NOTE — Discharge Instructions (Signed)
Please read and follow all provided instructions.  Your diagnoses today include:  1. Shortness of breath     Tests performed today include:  An EKG of your heart  A chest x-ray  CT of chest - shows no blood clots, small nodule that is stable and does not need follow-up  Cardiac enzymes - a blood test for heart muscle damage that is normal  Blood counts and electrolytes  Vital signs. See below for your results today.   Medications prescribed:   Ativan - medication for anxiety to use as needed.   Take any prescribed medications only as directed.  Follow-up instructions: Please follow-up with your primary care provider as soon as you can for further evaluation of your symptoms.   Return instructions:  SEEK IMMEDIATE MEDICAL ATTENTION IF:  You have severe chest pain, especially if the pain is crushing or pressure-like and spreads to the arms, back, neck, or jaw, or if you have sweating, nausea (feeling sick to your stomach), or shortness of breath. THIS IS AN EMERGENCY. Don't wait to see if the pain will go away. Get medical help at once. Call 911 or 0 (operator). DO NOT drive yourself to the hospital.   Your chest pain gets worse and does not go away with rest.   You have an attack of chest pain lasting longer than usual, despite rest and treatment with the medications your caregiver has prescribed.   You wake from sleep with chest pain or shortness of breath.  You feel dizzy or faint.  You have chest pain not typical of your usual pain for which you originally saw your caregiver.   You have any other emergent concerns regarding your health.  Additional Information: Chest pain comes from many different causes. Your caregiver has diagnosed you as having chest pain that is not specific for one problem, but does not require admission.  You are at low risk for an acute heart condition or other serious illness.   Your vital signs today were: BP 106/80   Pulse 92   Temp(Src)  98.7 F (37.1 C) (Oral)   Resp 21   SpO2 96% If your blood pressure (BP) was elevated above 135/85 this visit, please have this repeated by your doctor within one month. --------------

## 2013-07-05 NOTE — ED Notes (Signed)
Pt states while in CT, his SOB and "throat pain" got better.

## 2013-07-05 NOTE — ED Provider Notes (Signed)
CSN: [id number]     Arrival date & time 07/05/13  1710 History   First MD Initiated Contact with Patient 07/05/13 1726     Chief Complaint  Patient presents with  . Shortness of Breath  . Anxiety     (Consider location/radiation/quality/duration/timing/severity/associated sxs/prior Treatment) HPI 43 year old male presents with acute shortness of breath throat closing sensation that started about an hour and a half prior to arrival. He states he was sitting down using rehabilitation stools for his left knee when he started to feel the symptoms. He's never felt this way before. He's wondering if this is related to an anxiety attack others not have a significant history of anxiety. He recently had surgery on his left knee. Denies any leg swelling postoperative. He had a history of a pulmonary embolism after his most recent surgery several months ago and states he's concerned he might have another one. Denies any chest pain. He states both of his symptoms, the neck and shortness of breath improved. He has not tried anything to make them improved. Denies any actual pain but states he is also not had any change in his voice or trouble swallowing.  Past Medical History  Diagnosis Date  . Anemia     iron def  . Crohn's ileocolitis     7m since 2007  . Hemorrhoids   . B12 deficiency   . Prehypertension 03/09/2013  . Hypertension   . Pulmonary embolism    Past Surgical History  Procedure Laterality Date  . Ileocecal resection  03/2005  . Ileostomy/anastomotic resection for anastomotic leak  03/2005  . Ileostomy closure  08/2005  . Colonoscopy    . External fixation leg Left 02/27/2013    Procedure: EXTERNAL FIXATION LEFT KNEE ;  Surgeon: [name] MD;  [facility]  Service: Orthopedics;  Laterality: Left;  . External fixation leg Left 03/01/2013    Procedure: REVISION OF EXTERNAL FIXATOR LEFT LEG;  Surgeon: [name] MD;  [facility]  Service: Orthopedics;  Laterality: Left;   . Orif tibia fracture Left 03/08/2013    Procedure: LEFT OPEN REDUCTION INTERNAL FIXATION (ORIF) TIBIA FRACTURE;  Surgeon: [name] MD;  [facility] [facility]  Service: Orthopedics;  Laterality: Left;  . Lysis of adhesion Left 06/25/2013    WITH MANIPULATION     DR HANDY  . Knee arthroscopy Left 06/25/2013    Procedure: LYSIS OF ADHESION LEFT KNEE;  Surgeon: [name] MD;  [facility]  Service: Orthopedics;  Laterality: Left;   Family History  Problem Relation Age of Onset  . Breast cancer Mother   . Diabetes Father   . Crohn's disease    . Colon cancer Neg Hx    History  Substance Use Topics  . Smoking status: Never Smoker   . Smokeless tobacco: Never Used  . Alcohol Use: No    Review of Systems  Constitutional: Negative for fever.  HENT: Positive for sore throat. Negative for trouble swallowing and voice change.   Respiratory: Positive for shortness of breath. Negative for cough.   Cardiovascular: Negative for chest pain.  Gastrointestinal: Negative for vomiting and abdominal pain.  All other systems reviewed and are negative.     Allergies  Review of patient's allergies indicates no known allergies.  Home Medications   Prior to Admission medications   Medication Sig Start Date End Date Taking? Authorizing Provider  apixaban (ELIQUIS) 5 MG TABS tablet Take 1 tablet (5 mg total) by mouth  2 (two) times daily. 03/22/13  Yes Daniel J Angiulli, PA-C  methocarbamol (ROBAXIN) 500 MG tablet Take 1-2 tablets (500-1,000 mg total) by mouth every 6 (six) hours as needed for muscle spasms. 06/26/13  Yes Jari Pigg, PA-C  metoprolol tartrate (LOPRESSOR) 25 MG tablet Take 12.5 mg by mouth 2 (two) times daily.   Yes Historical Provider, MD  oxyCODONE (OXY IR/ROXICODONE) 5 MG immediate release tablet Take 1-2 tablets (5-10 mg total) by mouth every 4 (four) hours as needed for breakthrough pain. 06/26/13  Yes Jari Pigg, PA-C  oxyCODONE-acetaminophen (PERCOCET/ROXICET) 5-325  MG per tablet Take 1-2 tablets by mouth every 8 (eight) hours as needed for moderate pain or severe pain. 06/26/13  Yes Jari Pigg, PA-C  docusate sodium 100 MG CAPS Take 100 mg by mouth 2 (two) times daily. 06/26/13   Jari Pigg, PA-C   BP 113/50  Pulse 92  Temp(Src) 98.7 F (37.1 C) (Oral)  Resp 18  SpO2 100% Physical Exam  Nursing note and vitals reviewed. Constitutional: He is oriented to person, place, and time. He appears well-developed and well-nourished. No distress.  HENT:  Head: Normocephalic and atraumatic.  Right Ear: External ear normal.  Left Ear: External ear normal.  Nose: Nose normal.  Mouth/Throat: Oropharynx is clear and moist. No oropharyngeal exudate.  Eyes: Right eye exhibits no discharge. Left eye exhibits no discharge.  Neck: Neck supple.  Cardiovascular: Normal rate, regular rhythm, normal heart sounds and intact distal pulses.   Pulmonary/Chest: Effort normal and breath sounds normal. He has no wheezes. He has no rales. He exhibits no tenderness.  Abdominal: Soft. He exhibits no distension. There is no tenderness.  Musculoskeletal: He exhibits no edema.  Neurological: He is alert and oriented to person, place, and time.  Skin: Skin is warm and dry.    ED Course  Procedures (including critical care time) Labs Review Labs Reviewed  CBC - Abnormal; Notable for the following:    Hemoglobin 12.3 (*)    HCT 37.7 (*)    MCV 74.5 (*)    MCH 24.3 (*)    RDW 17.2 (*)    Platelets 427 (*)    All other components within normal limits  BASIC METABOLIC PANEL - Abnormal; Notable for the following:    Potassium 3.6 (*)    Glucose, Bld 114 (*)    GFR calc non Af Amer 83 (*)    Anion gap 17 (*)    All other components within normal limits  PRO B NATRIURETIC PEPTIDE  I-STAT TROPOININ, ED    Imaging Review Dg Chest 2 View  07/05/2013   CLINICAL DATA:  Shortness of breath, anxiety  EXAM: CHEST  2 VIEW  COMPARISON:  06/22/2013  FINDINGS: The heart size and  mediastinal contours are within normal limits. Both lungs are clear. The visualized skeletal structures are unremarkable.  IMPRESSION: No active cardiopulmonary disease.   Electronically Signed   By: Kathreen Devoid   On: 07/05/2013 18:11     EKG Interpretation   Date/Time:  Thursday July 05 2013 17:14:02 EDT Ventricular Rate:  92 PR Interval:  114 QRS Duration: 90 QT Interval:  362 QTC Calculation: 447 R Axis:   28 Text Interpretation:  Normal sinus rhythm Normal ECG No significant change  since 02/2013 Confirmed by Timofey Carandang  MD, Erdem Naas ([id number]) on 07/05/2013 6:10:13  PM      MDM   Final diagnoses:  None    Patient's exam is unremarkable. No voice change,  neck pain or tenderness or focal signs. Well appearing. CTA negative for PE. Given acute dyspnea, will get delta troponin, though I feel he is low risk for ACS and this is not a consistent history for that. If negative, likely is more anxiety related and will refer to PCP as outpatient.     Ephraim Hamburger, MD 07/05/13 2101

## 2013-07-05 NOTE — ED Notes (Addendum)
Pt arrived by [facility]. Had left knee surgery on 6/22, went to lay down today and had onset of sob he described as "feeling like his throat is closing." Hx of anxiety and PE. Airway intact at triage.

## 2013-07-05 NOTE — ED Provider Notes (Signed)
9:20 PM Handoff from Dr. Regenia Skeeter at shift change. Patient with past h/o PE, SOB sensation today. CT chest neg. Delta trop neg. Work-up unremarkable. Plan is to d/c to home. Pt informed. He is doing well.   9:30 PM Patient now with what appears to be anxiety attack. He is shaking, feeling anxious. Normal air movement, no wheezing or stridor. Full ROM neck. IV ativan ordered.   10:10 PM patient feeling better. Will discharge to home with limited supply of Ativan to use as needed for episodes of panic. Patient to followup with his primary care physician for reevaluation in the next week. Patient encouraged to return the emergency department with worsening trouble breathing, shortness of breath, fever/sore throat, chest pain, other concerns.  Carlisle Cater, PA-C 07/05/13 2210

## 2013-07-05 NOTE — ED Notes (Signed)
Pt. C/o shakes; feeling like he might not be able to breathe if he goes to lay down; conversation and encouraging slow breaths distracts him from the shakes; Josh PA-C made aware and at bedside to evaluate pt.

## 2013-07-05 NOTE — ED Provider Notes (Signed)
Medical screening examination/treatment/procedure(s) were performed by non-physician practitioner and as supervising physician I was immediately available for consultation/collaboration.   Delora Fuel, MD 35/24/81 [id number]

## 2013-07-09 ENCOUNTER — Ambulatory Visit: Payer: Worker's Compensation

## 2013-07-10 ENCOUNTER — Ambulatory Visit: Payer: BC Managed Care – PPO | Attending: Nurse Practitioner | Admitting: Physical Therapy

## 2013-07-10 DIAGNOSIS — R5381 Other malaise: Secondary | ICD-10-CM | POA: Insufficient documentation

## 2013-07-10 DIAGNOSIS — M25569 Pain in unspecified knee: Secondary | ICD-10-CM | POA: Insufficient documentation

## 2013-07-10 DIAGNOSIS — R262 Difficulty in walking, not elsewhere classified: Secondary | ICD-10-CM | POA: Diagnosis not present

## 2013-07-10 DIAGNOSIS — IMO0001 Reserved for inherently not codable concepts without codable children: Secondary | ICD-10-CM | POA: Diagnosis present

## 2013-07-10 DIAGNOSIS — M6281 Muscle weakness (generalized): Secondary | ICD-10-CM | POA: Insufficient documentation

## 2013-07-10 DIAGNOSIS — R609 Edema, unspecified: Secondary | ICD-10-CM | POA: Diagnosis not present

## 2013-07-10 DIAGNOSIS — R269 Unspecified abnormalities of gait and mobility: Secondary | ICD-10-CM | POA: Insufficient documentation

## 2013-07-10 DIAGNOSIS — M25669 Stiffness of unspecified knee, not elsewhere classified: Secondary | ICD-10-CM | POA: Insufficient documentation

## 2013-07-11 ENCOUNTER — Ambulatory Visit: Payer: Worker's Compensation

## 2013-07-13 ENCOUNTER — Ambulatory Visit: Payer: Worker's Compensation

## 2013-07-16 ENCOUNTER — Encounter: Payer: Self-pay | Admitting: Gastroenterology

## 2013-07-16 ENCOUNTER — Ambulatory Visit: Payer: Worker's Compensation

## 2013-07-16 ENCOUNTER — Ambulatory Visit (INDEPENDENT_AMBULATORY_CARE_PROVIDER_SITE_OTHER): Payer: BC Managed Care – PPO | Admitting: Gastroenterology

## 2013-07-16 VITALS — BP 120/80 | HR 104 | Ht 69.0 in | Wt 194.6 lb

## 2013-07-16 DIAGNOSIS — E538 Deficiency of other specified B group vitamins: Secondary | ICD-10-CM

## 2013-07-16 DIAGNOSIS — K5080 Crohn's disease of both small and large intestine without complications: Secondary | ICD-10-CM

## 2013-07-16 DIAGNOSIS — D509 Iron deficiency anemia, unspecified: Secondary | ICD-10-CM

## 2013-07-16 MED ORDER — FERROUS SULFATE 325 (65 FE) MG PO TABS: 325.0000 mg | ORAL_TABLET | Freq: Two times a day (BID) | ORAL | Status: DC

## 2013-07-16 NOTE — Progress Notes (Signed)
    History of Present Illness: This is a 43 year old male with Crohn's disease, B12 deficiency and Fe deficiency anemia noted in 03/2013. Last colonoscopy in 09/2010 was normal except for prior right hemicolectomy and internal hemorrhoids. As Crohn's disease has been inactive and he has not been on medications for couple years. He notes a slight decrease in weight, appetite and a slight increase in bowel movement frequency recently however he has been hospitalized for knee surgery. He has no other gastrointestinal complaints. He has not been obtaining B12 replacement as previously recommended. Although I deficiency was documented several months ago it is not clear that he has had iron replacement.  Current Medications, Allergies, Past Medical History, Past Surgical History, Family History and Social History were reviewed in [employer] record.  Physical Exam: General: Well developed , well nourished, no acute distress Head: Normocephalic and atraumatic Eyes:  sclerae anicteric, EOMI Ears: Normal auditory acuity Mouth: No deformity or lesions Lungs: Clear throughout to auscultation Heart: Regular rate and rhythm; no murmurs, rubs or bruits Abdomen: Soft, non tender and non distended. No masses, hepatosplenomegaly or hernias noted. Normal Bowel sounds Musculoskeletal: Symmetrical with no gross deformities  Pulses:  Normal pulses noted Extremities: No clubbing, cyanosis, edema or deformities noted Neurological: Alert oriented x 4, grossly nonfocal Psychological:  Alert and cooperative. Normal mood and affect  Assessment and Recommendations:  1. Crohn's ileocolitis. Prior ileocecectomy. No definite symptoms of active Crohn's disease. No medications for Crohn's. If his weight loss, mild bowel habit changes persists or if he develops other GI symptoms he is advised to return for sooner followup than routine 1 year followup.  2. Fe deficiency anemia and history of B12  deficiency. He has not had B12 in several months. Fe deficiency has not been treated. Begin Fe bid with meals. He is advised to return to primary physician for long-term management of his B12 deficiency and Fe deficiency. I advised him on the need for long term B12 replacement given his prior surgery. He states he has an appt with his PCP on Friday.

## 2013-07-16 NOTE — Patient Instructions (Signed)
We have sent the following medications to your pharmacy for you to pick up at your convenience: Iron 325 mg to take one tablet by mouth twice daily.   Follow up with your Primary Care Physician regarding your Vitamin B12 deficiency and Iron deficiency.   Thank you for choosing me and [facility].  Pricilla Riffle. Dagoberto Ligas., MD., Marval Regal  cc: Cooper Render, MD

## 2013-07-17 ENCOUNTER — Ambulatory Visit: Payer: BC Managed Care – PPO

## 2013-07-18 ENCOUNTER — Ambulatory Visit: Payer: Worker's Compensation

## 2013-07-19 ENCOUNTER — Ambulatory Visit: Payer: BC Managed Care – PPO | Admitting: Physical Therapy

## 2013-07-19 DIAGNOSIS — IMO0001 Reserved for inherently not codable concepts without codable children: Secondary | ICD-10-CM | POA: Diagnosis not present

## 2013-07-20 ENCOUNTER — Ambulatory Visit: Payer: Worker's Compensation

## 2013-07-23 ENCOUNTER — Ambulatory Visit: Payer: Worker's Compensation

## 2013-07-24 ENCOUNTER — Ambulatory Visit: Payer: BC Managed Care – PPO | Admitting: Physical Therapy

## 2013-07-24 DIAGNOSIS — IMO0001 Reserved for inherently not codable concepts without codable children: Secondary | ICD-10-CM | POA: Diagnosis not present

## 2013-07-25 ENCOUNTER — Ambulatory Visit: Payer: Worker's Compensation

## 2013-07-26 ENCOUNTER — Ambulatory Visit: Payer: BC Managed Care – PPO | Admitting: Physical Therapy

## 2013-07-26 DIAGNOSIS — IMO0001 Reserved for inherently not codable concepts without codable children: Secondary | ICD-10-CM | POA: Diagnosis not present

## 2013-07-27 ENCOUNTER — Ambulatory Visit: Payer: Worker's Compensation | Admitting: Physical Therapy

## 2013-07-30 ENCOUNTER — Ambulatory Visit: Payer: BC Managed Care – PPO | Admitting: Physical Therapy

## 2013-07-30 DIAGNOSIS — IMO0001 Reserved for inherently not codable concepts without codable children: Secondary | ICD-10-CM | POA: Diagnosis not present

## 2013-07-31 ENCOUNTER — Ambulatory Visit: Payer: BC Managed Care – PPO | Admitting: Physical Therapy

## 2013-07-31 DIAGNOSIS — IMO0001 Reserved for inherently not codable concepts without codable children: Secondary | ICD-10-CM | POA: Diagnosis not present

## 2013-08-01 ENCOUNTER — Ambulatory Visit: Payer: BC Managed Care – PPO | Admitting: Physical Therapy

## 2013-08-01 DIAGNOSIS — IMO0001 Reserved for inherently not codable concepts without codable children: Secondary | ICD-10-CM | POA: Diagnosis not present

## 2013-08-02 ENCOUNTER — Ambulatory Visit: Payer: BC Managed Care – PPO | Admitting: Physical Therapy

## 2013-08-02 DIAGNOSIS — IMO0001 Reserved for inherently not codable concepts without codable children: Secondary | ICD-10-CM | POA: Diagnosis not present

## 2013-08-03 ENCOUNTER — Ambulatory Visit: Payer: BC Managed Care – PPO | Admitting: Physical Therapy

## 2013-08-06 ENCOUNTER — Ambulatory Visit: Payer: BC Managed Care – PPO

## 2013-08-07 ENCOUNTER — Ambulatory Visit: Payer: BC Managed Care – PPO | Attending: Nurse Practitioner | Admitting: Physical Therapy

## 2013-08-07 DIAGNOSIS — R262 Difficulty in walking, not elsewhere classified: Secondary | ICD-10-CM | POA: Insufficient documentation

## 2013-08-07 DIAGNOSIS — R269 Unspecified abnormalities of gait and mobility: Secondary | ICD-10-CM | POA: Insufficient documentation

## 2013-08-07 DIAGNOSIS — M6281 Muscle weakness (generalized): Secondary | ICD-10-CM | POA: Diagnosis not present

## 2013-08-07 DIAGNOSIS — IMO0001 Reserved for inherently not codable concepts without codable children: Secondary | ICD-10-CM | POA: Insufficient documentation

## 2013-08-07 DIAGNOSIS — R609 Edema, unspecified: Secondary | ICD-10-CM | POA: Diagnosis not present

## 2013-08-07 DIAGNOSIS — M25669 Stiffness of unspecified knee, not elsewhere classified: Secondary | ICD-10-CM | POA: Insufficient documentation

## 2013-08-07 DIAGNOSIS — R5381 Other malaise: Secondary | ICD-10-CM | POA: Diagnosis not present

## 2013-08-07 DIAGNOSIS — M25569 Pain in unspecified knee: Secondary | ICD-10-CM | POA: Insufficient documentation

## 2013-08-08 ENCOUNTER — Ambulatory Visit: Payer: BC Managed Care – PPO | Admitting: Physical Therapy

## 2013-08-08 DIAGNOSIS — IMO0001 Reserved for inherently not codable concepts without codable children: Secondary | ICD-10-CM | POA: Diagnosis not present

## 2013-08-09 ENCOUNTER — Ambulatory Visit: Payer: BC Managed Care – PPO | Admitting: Physical Therapy

## 2013-08-09 DIAGNOSIS — IMO0001 Reserved for inherently not codable concepts without codable children: Secondary | ICD-10-CM | POA: Diagnosis not present

## 2013-09-05 ENCOUNTER — Ambulatory Visit (INDEPENDENT_AMBULATORY_CARE_PROVIDER_SITE_OTHER): Payer: BC Managed Care – PPO | Admitting: Gastroenterology

## 2013-09-05 ENCOUNTER — Encounter: Payer: Self-pay | Admitting: Gastroenterology

## 2013-09-05 VITALS — BP 100/60 | HR 80 | Ht 69.0 in | Wt 187.2 lb

## 2013-09-05 DIAGNOSIS — K219 Gastro-esophageal reflux disease without esophagitis: Secondary | ICD-10-CM

## 2013-09-05 DIAGNOSIS — R1319 Other dysphagia: Secondary | ICD-10-CM

## 2013-09-05 DIAGNOSIS — K5080 Crohn's disease of both small and large intestine without complications: Secondary | ICD-10-CM

## 2013-09-05 MED ORDER — PANTOPRAZOLE SODIUM 40 MG PO TBEC: 40.0000 mg | DELAYED_RELEASE_TABLET | Freq: Every day | ORAL | Status: DC

## 2013-09-05 NOTE — Patient Instructions (Signed)
We have sent the following medications to your pharmacy for you to pick up at your convenience: Protonix.  Patient advised to avoid spicy, acidic, citrus, chocolate, mints, fruit and fruit juices.  Limit the intake of caffeine, alcohol and Soda.  Don't exercise too soon after eating.  Don't lie down within 3-4 hours of eating.  Elevate the head of your bed.  You have been scheduled for a Barium Esophogram at [facility] (1st floor of the hospital) on 09/07/13 at 10:00am. Please arrive 15 minutes prior to your appointment for registration. Make certain not to have anything to eat or drink 6 hours prior to your test. If you need to reschedule for any reason, please contact radiology at [phone] to do so. __________________________________________________________________ A barium swallow is an examination that concentrates on views of the esophagus. This tends to be a double contrast exam (barium and two liquids which, when combined, create a gas to distend the wall of the oesophagus) or single contrast (non-ionic iodine based). The study is usually tailored to your symptoms so a good history is essential. Attention is paid during the study to the form, structure and configuration of the esophagus, looking for functional disorders (such as aspiration, dysphagia, achalasia, motility and reflux) EXAMINATION You may be asked to change into a gown, depending on the type of swallow being performed. A radiologist and radiographer will perform the procedure. The radiologist will advise you of the type of contrast selected for your procedure and direct you during the exam. You will be asked to stand, sit or lie in several different positions and to hold a small amount of fluid in your mouth before being asked to swallow while the imaging is performed .In some instances you may be asked to swallow barium coated marshmallows to assess the motility of a solid food bolus. The exam can be recorded as a digital  or video fluoroscopy procedure. POST PROCEDURE It will take 1-2 days for the barium to pass through your system. To facilitate this, it is important, unless otherwise directed, to increase your fluids for the next 24-48hrs and to resume your normal diet.  This test typically takes about 30 minutes to perform. __________________________________________________________________________________  Your follow up visit with Dr. Fuller Plan is scheduled for 10/15/13 at 11:00am. If you cannot make this appointment please contact our office at [phone].

## 2013-09-05 NOTE — Progress Notes (Signed)
    History of Present Illness: This is a 43 year old male with a history of Crohn's disease who relates sudden onset of dysphagia about one month ago which has now resolved. He states he had the sudden onset of difficulty swallowing solids and liquids and the symptoms lasted for about 3 days. He also notes looser stools for 1-2 weeks and an 5-10 lb weight loss. His bowel habits have returned to normal and he is currently gaining weight. He was placed on pantoprazole by his primary physician. His only complaint today is frequent belching. He is maintained on Eliquis for DVT/PE prophylaxis following left knee surgery.  Current Medications, Allergies, Past Medical History, Past Surgical History, Family History and Social History were reviewed in [employer] record.  Physical Exam: General: Well developed , well nourished, no acute distress Head: Normocephalic and atraumatic Eyes:  sclerae anicteric, EOMI Ears: Normal auditory acuity Mouth: No deformity or lesions Lungs: Clear throughout to auscultation Heart: Regular rate and rhythm; no murmurs, rubs or bruits Abdomen: Soft, non tender and non distended. No masses, hepatosplenomegaly or hernias noted. Normal Bowel sounds Musculoskeletal: Symmetrical with no gross deformities  Pulses:  Normal pulses noted Extremities: No clubbing, cyanosis, or deformities noted. Left knee edema and decreased range of motion. Neurological: Alert oriented x 4, grossly nonfocal Psychological:  Alert and cooperative. Normal mood and affect  Assessment and Recommendations:  1. Dysphagia, weight loss, diarrhea-all symptoms have resolved. History of Crohn's disease. Possible GERD and possible infectious diarrhea leading to his recent complaints. Given Eliquis usage will schedule barium esophagram. Request blood work and office notes from his primary physician's office. Continue pantoprazole 40 mg daily and standard antireflux measures. Return  office in 4 weeks.  2. Crohn's disease.  3. History of iron deficiency anemia and history of B12 deficiency. Management per PCP.

## 2013-09-07 ENCOUNTER — Ambulatory Visit (HOSPITAL_COMMUNITY): Payer: BC Managed Care – PPO

## 2013-09-11 ENCOUNTER — Ambulatory Visit (HOSPITAL_COMMUNITY)
Admission: RE | Admit: 2013-09-11 | Discharge: 2013-09-11 | Disposition: A | Discharge status: Home / Self Care | Payer: BC Managed Care – PPO | Source: Ambulatory Visit | Attending: Gastroenterology | Admitting: Gastroenterology

## 2013-09-11 ENCOUNTER — Other Ambulatory Visit: Payer: Self-pay

## 2013-09-11 DIAGNOSIS — R1319 Other dysphagia: Secondary | ICD-10-CM

## 2013-09-11 DIAGNOSIS — R1310 Dysphagia, unspecified: Secondary | ICD-10-CM | POA: Diagnosis not present

## 2013-09-19 ENCOUNTER — Other Ambulatory Visit (HOSPITAL_COMMUNITY): Payer: Self-pay | Admitting: Gastroenterology

## 2013-09-19 DIAGNOSIS — R1310 Dysphagia, unspecified: Secondary | ICD-10-CM

## 2013-10-04 ENCOUNTER — Ambulatory Visit (HOSPITAL_COMMUNITY)
Admission: RE | Admit: 2013-10-04 | Discharge: 2013-10-04 | Disposition: A | Discharge status: Home / Self Care | Payer: BC Managed Care – PPO | Source: Ambulatory Visit | Attending: Gastroenterology | Admitting: Gastroenterology

## 2013-10-04 DIAGNOSIS — Z8719 Personal history of other diseases of the digestive system: Secondary | ICD-10-CM | POA: Diagnosis not present

## 2013-10-04 DIAGNOSIS — Z86711 Personal history of pulmonary embolism: Secondary | ICD-10-CM | POA: Insufficient documentation

## 2013-10-04 DIAGNOSIS — R1314 Dysphagia, pharyngoesophageal phase: Secondary | ICD-10-CM | POA: Diagnosis present

## 2013-10-04 DIAGNOSIS — D509 Iron deficiency anemia, unspecified: Secondary | ICD-10-CM | POA: Insufficient documentation

## 2013-10-04 DIAGNOSIS — K5090 Crohn's disease, unspecified, without complications: Secondary | ICD-10-CM | POA: Diagnosis not present

## 2013-10-04 DIAGNOSIS — R1312 Dysphagia, oropharyngeal phase: Secondary | ICD-10-CM | POA: Diagnosis not present

## 2013-10-04 DIAGNOSIS — R1310 Dysphagia, unspecified: Secondary | ICD-10-CM | POA: Diagnosis present

## 2013-10-04 DIAGNOSIS — Z7901 Long term (current) use of anticoagulants: Secondary | ICD-10-CM | POA: Diagnosis not present

## 2013-10-04 DIAGNOSIS — E559 Vitamin D deficiency, unspecified: Secondary | ICD-10-CM | POA: Diagnosis not present

## 2013-10-04 DIAGNOSIS — I10 Essential (primary) hypertension: Secondary | ICD-10-CM | POA: Diagnosis not present

## 2013-10-04 NOTE — Procedures (Signed)
Objective Swallowing Evaluation: Modified Barium Swallowing Study  Patient Details  Name: [name] [name] MRN: [id number] Date of Birth: [birth year]  Today's Date: 10/04/2013 Time: 1200-1230 SLP Time Calculation (min): 30 min  Past Medical History:  Past Medical History  Diagnosis Date  . Anemia     iron def  . Crohn's ileocolitis     43m since 2007  . Hemorrhoids   . B12 deficiency   . Prehypertension 03/09/2013  . Hypertension   . Pulmonary embolism    Past Surgical History:  Past Surgical History  Procedure Laterality Date  . Ileocecal resection  03/2005  . Ileostomy/anastomotic resection for anastomotic leak  03/2005  . Ileostomy closure  08/2005  . Colonoscopy    . External fixation leg Left 02/27/2013    Procedure: EXTERNAL FIXATION LEFT KNEE ;  Surgeon: [name] MD;  Location: [facility]  Service: Orthopedics;  Laterality: Left;  . External fixation leg Left 03/01/2013    Procedure: REVISION OF EXTERNAL FIXATOR LEFT LEG;  Surgeon: [name] MD;  [facility] [facility]  Service: Orthopedics;  Laterality: Left;  . Orif tibia fracture Left 03/08/2013    Procedure: LEFT OPEN REDUCTION INTERNAL FIXATION (ORIF) TIBIA FRACTURE;  Surgeon: [name] MD;  [place] [facility]  Service: Orthopedics;  Laterality: Left;  . Lysis of adhesion Left 06/25/2013    WITH MANIPULATION     DR HANDY  . Knee arthroscopy Left 06/25/2013    Procedure: LYSIS OF ADHESION LEFT KNEE;  Surgeon: [name] MD;  [facility] [facility]  Service: Orthopedics;  Laterality: Left;   HPI:  This is a 43year old male with a history of Crohn's disease who relates sudden onset of dysphagia about two months ago which has now resolved. He states he had the sudden onset of difficulty swallowing solids and liquids and the symptoms lasted for about 3 days. He also notes looser stools and an 5-10 lb weight loss. Per report, his bowel habits have returned to normal and he is currently gaining weight. He was placed on  pantoprazole by his primary physician.      Assessment / Plan / Recommendation Clinical Impression  Dysphagia Diagnosis: Mild oral phase dysphagia;Mild pharyngeal phase dysphagia Pt presents with a mild oropharyngeal phase dysphagia characterized by delayed swallow initiation to the level of the pyriform sinuses with adequate airway protection achieved during the swallow with strong hyolaryngeal excursion. Pt was also noted with decreased UES relaxation resulting in mild pharyngeal residue of liquids at the vallecula, pyriforms, and UES which cleared with extra swallows.  Residue significantly improved with more solid boluses such as puree and regular consistencies. Most notable on today's study was pt's inability to clear barium tablet from the oral cavity despite following tablet with thin liquid and pureed boluses.  Suspect pt with some element of anxiety contributing to difficulty clearing pills from the oral cavity as he exhibited no focal areas of oral weakness and became visibly restless when attempting to swallow tablet.  Recommend that pt continue on a regular diet with thin liquids; alternate solid and liquid consistencies and crush medications in puree as indicated.      Treatment Recommendation  No treatment recommended at this time    Diet Recommendation Regular;Thin liquid   Liquid Administration via: Cup;Straw Medication Administration: Other (Comment) (whole meds with liquids or crushed in puree as tolerated) Supervision: Patient able to self feed Compensations: Slow rate;Small sips/bites Postural Changes and/or Swallow Maneuvers: Seated upright 90 degrees;Upright 30-60 min  after meal    Other  Recommendations Recommended Consults: Other (Comment) (Continue follow up with GI)   Follow Up Recommendations  None         General HPI: This is a 43 year old male with a history of Crohn's disease who relates sudden onset of dysphagia about two months ago which has now resolved. He  states he had the sudden onset of difficulty swallowing solids and liquids and the symptoms lasted for about 3 days. He also notes looser stools for 1-2 weeks and an 5-10 lb weight loss. Per report, his bowel habits have returned to normal and he is currently gaining weight. He was placed on pantoprazole by his primary physician.  Type of Study: Modified Barium Swallowing Study Reason for Referral: Objectively evaluate swallowing function Previous Swallow Assessment: none on record Diet Prior to this Study: Regular;Thin liquids Respiratory Status: Room air History of Recent Intubation: No Behavior/Cognition: Alert;Cooperative;Pleasant mood Oral Cavity - Dentition: Adequate natural dentition Oral Motor / Sensory Function: Within functional limits Self-Feeding Abilities: Able to feed self Patient Positioning: Upright in chair Baseline Vocal Quality: Clear Volitional Cough: Strong Anatomy: Within functional limits    Reason for Referral Objectively evaluate swallowing function   Oral Phase Oral Preparation/Oral Phase Oral Phase: Impaired Oral - Solids Oral - Pill: Other (Comment) (unable to clear barium pill from the oral cavity with thin liquids or puree boluses )   Pharyngeal Phase Pharyngeal Phase Pharyngeal Phase: Impaired Pharyngeal - Thin Pharyngeal - Thin Cup: Delayed swallow initiation;Premature spillage to pyriform sinuses;Pharyngeal residue - valleculae;Pharyngeal residue - pyriform sinuses;Pharyngeal residue - cp segment Pharyngeal - Solids Pharyngeal - Puree: Within functional limits Pharyngeal - Regular: Within functional limits  Cervical Esophageal Phase    GO    Cervical Esophageal Phase Cervical Esophageal Phase: Impaired Cervical Esophageal Phase - Thin Thin Cup: Reduced cricopharyngeal relaxation         Olar Santini, Selinda Orion 10/04/2013, 7:09 PM

## 2013-10-15 ENCOUNTER — Ambulatory Visit (INDEPENDENT_AMBULATORY_CARE_PROVIDER_SITE_OTHER): Payer: BC Managed Care – PPO | Admitting: Gastroenterology

## 2013-10-15 ENCOUNTER — Encounter: Payer: Self-pay | Admitting: Gastroenterology

## 2013-10-15 VITALS — BP 100/64 | HR 72 | Ht 69.0 in | Wt 193.5 lb

## 2013-10-15 DIAGNOSIS — K219 Gastro-esophageal reflux disease without esophagitis: Secondary | ICD-10-CM

## 2013-10-15 DIAGNOSIS — R1312 Dysphagia, oropharyngeal phase: Secondary | ICD-10-CM

## 2013-10-15 DIAGNOSIS — D509 Iron deficiency anemia, unspecified: Secondary | ICD-10-CM

## 2013-10-15 MED ORDER — PANTOPRAZOLE SODIUM 40 MG PO TBEC: 40.0000 mg | DELAYED_RELEASE_TABLET | Freq: Every day | ORAL | Status: DC

## 2013-10-15 NOTE — Patient Instructions (Addendum)
We have sent the following medications to your pharmacy for you to pick up at your convenience:  Protonix  Please follow up with Dr. Fuller Plan in one year.  cc:  Cooper Render, MD

## 2013-10-15 NOTE — Progress Notes (Signed)
    History of Present Illness: This is a 43 year old male returning for followup of dysphagia. Barium esophagram was unremarkable except that he had slight difficulty initiating the swallow of the barium tablet. MBSS performed showing: Mild oral phase dysphagia;Mild pharyngeal phase dysphagia. He notes problems swallowing large pills but other than that he is no longer having any difficulty swallowing.  Current Medications, Allergies, Past Medical History, Past Surgical History, Family History and Social History were reviewed in [employer] record.  Physical Exam: General: Well developed , well nourished, no acute distress Head: Normocephalic and atraumatic Eyes:  sclerae anicteric, EOMI Ears: Normal auditory acuity Mouth: No deformity or lesions Lungs: Clear throughout to auscultation Heart: Regular rate and rhythm; no murmurs, rubs or bruits Abdomen: Soft, non tender and non distended. No masses, hepatosplenomegaly or hernias noted. Normal Bowel sounds Musculoskeletal: Symmetrical with no gross deformities  Pulses:  Normal pulses noted Extremities: No clubbing, cyanosis, edema or deformities noted Neurological: Alert oriented x 4, grossly nonfocal Psychological:  Alert and cooperative. Normal mood and affect  Assessment and Recommendations:  1. Dysphagia, mild oropharyngeal. Presumed GERD. Continue Protonix 40 mg daily and standard antireflux measures. Followup in 1 year with me or his PCP.   2. Crohn's ileocolitis. Prior ileocecectomy. No definite symptoms of active Crohn's disease. No medications for Crohn's. Followup than routine 1 year followup.   3. Fe deficiency anemia and history of B12 deficiency. Continue Fe bid with meals. Colonoscopy in 2012 was only remarkable for hemorrhoids and prior ileocolectomy. Return to his PCP for long-term management of his B12 deficiency and Fe deficiency. I advised him on the need for long term B12 replacement given his  prior surgery.

## 2013-10-16 ENCOUNTER — Telehealth: Payer: Self-pay | Admitting: *Deleted

## 2013-10-16 NOTE — Telephone Encounter (Signed)
We have gotten authorization from [employer] for patient's pantoprazole until 10/16/14. Prior auth # is [id number].

## 2013-11-26 ENCOUNTER — Telehealth: Payer: Self-pay | Admitting: *Deleted

## 2013-11-26 NOTE — Telephone Encounter (Signed)
We have gotten prior authorization through express scripts for additional quantity for pantoprazole from 10/17/13-11/26/14. Case ID [id number].

## 2014-02-05 ENCOUNTER — Other Ambulatory Visit: Payer: Self-pay | Admitting: Gastroenterology

## 2014-08-21 ENCOUNTER — Telehealth: Payer: Self-pay

## 2014-08-21 ENCOUNTER — Ambulatory Visit (INDEPENDENT_AMBULATORY_CARE_PROVIDER_SITE_OTHER): Payer: BC Managed Care – PPO | Admitting: Gastroenterology

## 2014-08-21 ENCOUNTER — Encounter: Payer: Self-pay | Admitting: Gastroenterology

## 2014-08-21 VITALS — BP 118/70 | HR 92 | Ht 69.5 in | Wt 226.2 lb

## 2014-08-21 DIAGNOSIS — R1032 Left lower quadrant pain: Secondary | ICD-10-CM | POA: Diagnosis not present

## 2014-08-21 DIAGNOSIS — R194 Change in bowel habit: Secondary | ICD-10-CM

## 2014-08-21 DIAGNOSIS — K921 Melena: Secondary | ICD-10-CM | POA: Diagnosis not present

## 2014-08-21 MED ORDER — NA SULFATE-K SULFATE-MG SULF 17.5-3.13-1.6 GM/177ML PO SOLN: 1.0000 | Freq: Once | ORAL | Status: DC

## 2014-08-21 MED ORDER — HYOSCYAMINE SULFATE 0.125 MG SL SUBL: SUBLINGUAL_TABLET | SUBLINGUAL | Status: DC

## 2014-08-21 NOTE — Telephone Encounter (Signed)
   08/21/2014   RE: [name] [name] DOB: 16-Aug-1970 MRN: [id number]   Dear Dr. Rogue Bussing,    We have scheduled the above patient for an endoscopic procedure. Our records show that he is on anticoagulation therapy.   Please advise as to how long the patient may come off his therapy of Eliquis prior to the procedure, which is scheduled for 09/02/14.  Please fax back/ or route the completed form to Marlon Pel, [place] at [phone].   Sincerely,    Marlon Pel, CMA Lucio Edward, MD

## 2014-08-21 NOTE — Telephone Encounter (Signed)
Please advise. You did prescribe his Eliquis last.

## 2014-08-21 NOTE — Progress Notes (Addendum)
    History of Present Illness: This is a 44 year old male with a history of Crohn's ileocolitis and ileocecectomy. His Crohn's disease has been inactive and has not been on IBD medication for a few years. He has had B12 deficiency and iron deficiency as well. In June he had an episode of bright red blood per rectum but no bleeding since. For 1 month he notes frequent left lower quadrant pain sometimes following meals and sometimes associated with bowel movements. This is been associated with a change in bowel habits. His last colonoscopy was in September 2012 which showed internal hemorrhoids and a prior ileocolectomy. Was otherwise unremarkable.  Current Medications, Allergies, Past Medical History, Past Surgical History, Family History and Social History were reviewed in [employer] record.  Physical Exam: General: Well developed , well nourished, no acute distress Head: Normocephalic and atraumatic Eyes:  sclerae anicteric, EOMI Ears: Normal auditory acuity Mouth: No deformity or lesions Lungs: Clear throughout to auscultation Heart: Regular rate and rhythm; no murmurs, rubs or bruits Abdomen: Soft, non tender and non distended. No masses, hepatosplenomegaly or hernias noted. Normal Bowel sounds Rectal: deferred to colonoscopy Musculoskeletal: Symmetrical with no gross deformities  Pulses:  Normal pulses noted Extremities: No clubbing, cyanosis, edema or deformities noted Neurological: Alert oriented x 4, grossly nonfocal Psychological:  Alert and cooperative. Normal mood and affect  Assessment and Recommendations:  1. Hematochezia, left lower quadrant pain, change in bowel habits, history of Crohn's ileocolitis. Rule out active Crohn's disease. Bleeding could be from internal hemorrhoids. Levsin 1-2 q4h as needed. Schedule colonoscopy. Request most recent blood work from his PCP. The risks (including bleeding, perforation, infection, missed lesions, medication  reactions and possible hospitalization or surgery if complications occur), benefits, and alternatives to colonoscopy with possible biopsy and possible polypectomy were discussed with the patient and they consent to proceed.

## 2014-08-21 NOTE — Patient Instructions (Signed)
You have been scheduled for a colonoscopy. Please follow written instructions given to you at your visit today.  Please pick up your prep supplies at the pharmacy within the next 1-3 days. If you use inhalers (even only as needed), please bring them with you on the day of your procedure.  We have sent the following medications to your pharmacy for you to pick up at your convenience:Levsin.  Thank you for choosing me and [facility].  Pricilla Riffle. Dagoberto Ligas., MD., Marval Regal  cc: Cooper Render, NP

## 2014-08-27 NOTE — Telephone Encounter (Signed)
Spoke with [place] at [facility] and she states his PCP is Darcey Nora which is FNP at that office. I asked [place] if she can given approval for patient coming off his Eliquis prior to his Colonoscopy. Susette Racer went and asked and got approval for patient to stop Eliquis. Informed her we prefer for it to be a written order and I will fax the letter to her office. [place] verbalized understanding and gave me the fax number [phone]. Fax went through and now awaiting on response from FNP.

## 2014-08-27 NOTE — Telephone Encounter (Signed)
Left a message for Dr. Elvis Coil office to return my call regarding patient's anticoagulant clearance for Eliquis.

## 2014-08-28 NOTE — Telephone Encounter (Signed)
[place] from [facility] called me back and states she called the patient and now he states Dr. Marcelino [name] is the one managing his Eliquis. She states she faxed the letter to there office to fill out. Sent letter to Dr. Marcelino [name] to manage and will call his office tomorrow.

## 2014-08-29 NOTE — Telephone Encounter (Signed)
Called Dr. Carlean Jews office and they informed me that the patient was told to stop his Eliquis last Novemeber after his doppler ultrasound by Dr. Marcelino [name]. There office states they have documentation that they informed the patient to stop the Eliquis and she he should not be currently taking it because he has not had it filled since last year. Told them I will contact the patient to find out if he is taking Eliquis. Called patient and told him what Dr. Carlean Jews office said and the patient apologized and states he is no longer on Eliquis. He was confused in the office when he saw Dr. Fuller Plan and he has not taken this medication since November of last year. I made the patient confirm by looking at his medication list they he not taking Eliquis. I explained to the patient the risks that can be involved if he has the procedure when he is taking Eliquis. Pt states again he is not currently on Eliquis. Told patient I will send this to Dr. Fuller Plan to let him know. Also patient states he cannot afford his Suprep and wants to know if we have any free samples in the office. Told patient I will leave a sample of the prep our front for him to pick up. Pt verbalized understanding.

## 2014-09-02 ENCOUNTER — Encounter: Payer: Self-pay | Admitting: Gastroenterology

## 2014-09-02 ENCOUNTER — Ambulatory Visit (AMBULATORY_SURGERY_CENTER): Payer: BC Managed Care – PPO | Admitting: Gastroenterology

## 2014-09-02 VITALS — BP 125/85 | HR 68 | Temp 97.5°F | Resp 16 | Ht 69.5 in | Wt 226.0 lb

## 2014-09-02 DIAGNOSIS — K648 Other hemorrhoids: Secondary | ICD-10-CM | POA: Diagnosis not present

## 2014-09-02 DIAGNOSIS — K529 Noninfective gastroenteritis and colitis, unspecified: Secondary | ICD-10-CM | POA: Diagnosis not present

## 2014-09-02 DIAGNOSIS — Z8719 Personal history of other diseases of the digestive system: Secondary | ICD-10-CM

## 2014-09-02 DIAGNOSIS — K921 Melena: Secondary | ICD-10-CM

## 2014-09-02 DIAGNOSIS — K639 Disease of intestine, unspecified: Secondary | ICD-10-CM | POA: Diagnosis not present

## 2014-09-02 DIAGNOSIS — K633 Ulcer of intestine: Secondary | ICD-10-CM

## 2014-09-02 MED ORDER — SODIUM CHLORIDE 0.9 % IV SOLN: 500.0000 mL | INTRAVENOUS | Status: DC

## 2014-09-02 NOTE — Progress Notes (Signed)
To recovery, report to Tyrell, RN, VSS

## 2014-09-02 NOTE — Progress Notes (Addendum)
[place] TENDS TO PASS OUT FOLLOWING MEDICAL PROCEDURES. PATIENT GRADUALLY INCREASED POSITION TO SITTING ON THE SIDE OF THE BED. PATIENT DENIES FEELINGS OF FAINTNESS. PATIENT DRESSED WITH WIFE'S ASSISTANCE. PATIENT STATING HE HAS A DULL FEELING AT HIS RECTUM. PATIENT TO RESTROOM PRIOR TO DISCHARGE. PATIENT DENIES DIZZINESS OR ANY RECTAL BLEEDING. PATIENT DISCHARGED PER WHEELCHAIR.

## 2014-09-02 NOTE — Patient Instructions (Addendum)
IF YOU HAVE RECTAL PAIN:  USE TYLENOL IF NEEDED FOR RECTAL PAIN.  SIT IN WARM BATH TO EASE RECTAL PAIN IF YOUR PAIN IS NOT CONTROLLED WITH THESE MEASURES CALL MD ON CALL.     YOU HAD AN ENDOSCOPIC PROCEDURE TODAY AT [facility]:   Refer to the procedure report that was given to you for any specific questions about what was found during the examination.  If the procedure report does not answer your questions, please call your gastroenterologist to clarify.  If you requested that your care partner not be given the details of your procedure findings, then the procedure report has been included in a sealed envelope for you to review at your convenience later.  YOU SHOULD EXPECT: Some feelings of bloating in the abdomen. Passage of more gas than usual.  Walking can help get rid of the air that was put into your GI tract during the procedure and reduce the bloating. If you had a lower endoscopy (such as a colonoscopy or flexible sigmoidoscopy) you may notice spotting of blood in your stool or on the toilet paper. If you underwent a bowel prep for your procedure, you may not have a normal bowel movement for a few days.  Please Note:  You might notice some irritation and congestion in your nose or some drainage.  This is from the oxygen used during your procedure.  There is no need for concern and it should clear up in a day or so.  SYMPTOMS TO REPORT IMMEDIATELY:   Following lower endoscopy (colonoscopy or flexible sigmoidoscopy):  Excessive amounts of blood in the stool  Significant tenderness or worsening of abdominal pains  Swelling of the abdomen that is new, acute  Fever of 100F or higher   For urgent or emergent issues, a gastroenterologist can be reached at any hour by calling [phone].   DIET: Your first meal following the procedure should be a small meal and then it is ok to progress to your normal diet. Heavy or fried foods are harder to digest and may make  you feel nauseous or bloated.  Likewise, meals heavy in dairy and vegetables can increase bloating.  Drink plenty of fluids but you should avoid alcoholic beverages for 24 hours.  ACTIVITY:  You should plan to take it easy for the rest of today and you should NOT DRIVE or use heavy machinery until tomorrow (because of the sedation medicines used during the test).    FOLLOW UP: Our staff will call the number listed on your records the next business day following your procedure to check on you and address any questions or concerns that you may have regarding the information given to you following your procedure. If we do not reach you, we will leave a message.  However, if you are feeling well and you are not experiencing any problems, there is no need to return our call.  We will assume that you have returned to your regular daily activities without incident.  If any biopsies were taken you will be contacted by phone or by letter within the next 1-3 weeks.  Please call us at [phone] if you have not heard about the biopsies in 3 weeks.    SIGNATURES/CONFIDENTIALITY: You and/or your care partner have signed paperwork which will be entered into your electronic medical record.  These signatures attest to the fact that that the information above on your After Visit Summary has been reviewed and is understood.  Full responsibility of the confidentiality of this discharge information lies with you and/or your care-partner.

## 2014-09-02 NOTE — Op Note (Signed)
[facility]  [address] [place], [address]   COLONOSCOPY PROCEDURE REPORT  PATIENT: [name] [name], [name] [name]  MR#: [id number] BIRTHDATE: 19-Mar-1970 , 61  yrs. old GENDER: male ENDOSCOPIST: Ladene Artist, MD, [facility] PROCEDURE DATE:  09/02/2014 PROCEDURE:   Colonoscopy, diagnostic, Colonoscopy with biopsy, and Hemorrhoidectomy via sclerosing First Screening Colonoscopy - Avg.  risk and is 50 yrs.  old or older - No.  Prior Negative Screening - Now for repeat screening. N/A  History of Adenoma - Now for follow-up colonoscopy & has been > or = to 3 yrs.  N/A  Polyps removed today? No Recommend repeat exam, <10 yrs? No ASA CLASS:   Class II INDICATIONS:Evaluation of unexplained GI bleeding, Colorectal Neoplasm Risk Assessment for this procedure is average risk, and Inflammatory bowel disease of the intestine if more precise diagnosis or determination of the extent / severity of activity of disease will influence immediate / future management. MEDICATIONS: Monitored anesthesia care and Propofol 350 mg IV DESCRIPTION OF PROCEDURE:   After the risks benefits and alternatives of the procedure were thoroughly explained, informed consent was obtained.  The digital rectal exam revealed no abnormalities of the rectum.   The LB [id number]  endoscope was introduced through the anus and advanced to the terminal ileum which was intubated for a short distance. No adverse events experienced.   The quality of the prep was good.  (Suprep was used) The instrument was then slowly withdrawn as the colon was fully examined. Estimated blood loss is zero unless otherwise noted in this procedure report.  COLON FINDINGS: Erosions were found in the last few centimeters of the distal neo-terminal ileum.  Multiple biopsies of the area were performed. the neoterminal ileum proximal to this area appeared normal.  There was evidence of a prior ileocolonic surgical anastomosis in the ascending  colon.   The examination was otherwise normal.  Retroflexed views revealed moderate sized internal Grade I hemorrhoids.  1 cc of 23.4 % saline was injected into the internal hemorrhoids well above the dentate line.  The time to cecum = 3.8 Withdrawal time = 13.1   The scope was withdrawn and the procedure completed. COMPLICATIONS: There were no immediate complications.  ENDOSCOPIC IMPRESSION: 1.   Erosions in the terminal ileum; multiple biopsies performed 2.   Prior ileocolonic surgical anastomosis in the ascending colon 3.   Moderate sized Grade l internal hemorrhoids; injected  RECOMMENDATIONS: 1.  Await pathology results 2.  Continue current colorectal screening recommendations for "routine risk" patients with a repeat colonoscopy in 10 years.  eSigned:  Ladene Artist, MD, [facility] 09/02/2014 3:45 PM   cc: Cooper Render, MD

## 2014-09-02 NOTE — Progress Notes (Signed)
Called to room to assist during endoscopic procedure.  Patient ID and intended procedure confirmed with present staff. Received instructions for my participation in the procedure from the performing physician.  

## 2014-09-03 ENCOUNTER — Telehealth: Payer: Self-pay | Admitting: *Deleted

## 2014-09-03 NOTE — Telephone Encounter (Signed)
  Follow up Call-  Call back number 09/02/2014  Post procedure Call Back phone  # [phone]  Permission to leave phone message Yes     Patient questions:  Do you have a fever, pain , or abdominal swelling? No. Pain Score  0 *  Have you tolerated food without any problems? Yes.    Have you been able to return to your normal activities? Yes.    Do you have any questions about your discharge instructions: Diet   No. Medications  No. Follow up visit  No.  Do you have questions or concerns about your Care? No.  Actions: * If pain score is 4 or above: No action needed, pain <4.

## 2014-09-05 ENCOUNTER — Encounter: Payer: Self-pay | Admitting: Gastroenterology

## 2017-04-20 ENCOUNTER — Emergency Department (HOSPITAL_COMMUNITY)
Admission: EM | Admit: 2017-04-20 | Discharge: 2017-04-20 | Disposition: A | Discharge status: Home / Self Care | Payer: BC Managed Care – PPO | Attending: Emergency Medicine | Admitting: Emergency Medicine

## 2017-04-20 ENCOUNTER — Encounter (HOSPITAL_COMMUNITY): Payer: Self-pay | Admitting: Emergency Medicine

## 2017-04-20 DIAGNOSIS — Z7901 Long term (current) use of anticoagulants: Secondary | ICD-10-CM | POA: Diagnosis not present

## 2017-04-20 DIAGNOSIS — R1012 Left upper quadrant pain: Secondary | ICD-10-CM

## 2017-04-20 DIAGNOSIS — I10 Essential (primary) hypertension: Secondary | ICD-10-CM | POA: Insufficient documentation

## 2017-04-20 DIAGNOSIS — K529 Noninfective gastroenteritis and colitis, unspecified: Secondary | ICD-10-CM | POA: Diagnosis not present

## 2017-04-20 DIAGNOSIS — Z79899 Other long term (current) drug therapy: Secondary | ICD-10-CM | POA: Diagnosis not present

## 2017-04-20 LAB — COMPREHENSIVE METABOLIC PANEL
ALBUMIN: 3.6 g/dL (ref 3.5–5.0)
ALT: 63 U/L (ref 17–63)
AST: 77 U/L — AB (ref 15–41)
Alkaline Phosphatase: 79 U/L (ref 38–126)
Anion gap: 7 (ref 5–15)
BUN: 11 mg/dL (ref 6–20)
CO2: 26 mmol/L (ref 22–32)
Calcium: 9.1 mg/dL (ref 8.9–10.3)
Chloride: 104 mmol/L (ref 101–111)
Creatinine, Ser: 1.35 mg/dL — ABNORMAL HIGH (ref 0.61–1.24)
GFR calc Af Amer: >60 mL/min (ref >60)
GFR calc non Af Amer: >60 mL/min (ref >60)
GLUCOSE: 126 mg/dL — AB (ref 65–99)
Potassium: 3.4 mmol/L — ABNORMAL LOW (ref 3.5–5.1)
Sodium: 137 mmol/L (ref 135–145)
TOTAL PROTEIN: 6.8 g/dL (ref 6.5–8.1)
Total Bilirubin: 0.9 mg/dL (ref 0.3–1.2)

## 2017-04-20 LAB — CBC
HEMATOCRIT: 41.2 % (ref 39.0–52.0)
Hemoglobin: 13.5 g/dL (ref 13.0–17.0)
MCH: 26.5 pg (ref 26.0–34.0)
MCHC: 32.8 g/dL (ref 30.0–36.0)
MCV: 80.8 fL (ref 78.0–100.0)
Platelets: 280 10*3/uL (ref 150–400)
RBC: 5.1 MIL/uL (ref 4.22–5.81)
RDW: 14.3 % (ref 11.5–15.5)
WBC: 5.1 10*3/uL (ref 4.0–10.5)

## 2017-04-20 LAB — URINALYSIS, ROUTINE W REFLEX MICROSCOPIC
BILIRUBIN URINE: NEGATIVE
GLUCOSE, UA: NEGATIVE mg/dL
HGB URINE DIPSTICK: NEGATIVE
KETONES UR: NEGATIVE mg/dL
Leukocytes, UA: NEGATIVE
NITRITE: NEGATIVE
Protein, ur: NEGATIVE mg/dL
Specific Gravity, Urine: 1.024 (ref 1.005–1.030)
pH: 6 (ref 5.0–8.0)

## 2017-04-20 LAB — LIPASE, BLOOD: Lipase: 23 U/L (ref 11–51)

## 2017-04-20 MED ORDER — GI COCKTAIL ~~LOC~~
30.0000 mL | Freq: Once | ORAL | Status: AC
  Administered 2017-04-20: 30 mL via ORAL
  Filled 2017-04-20: qty 30
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
## 2017-04-20 NOTE — ED Notes (Signed)
Declined W/C at D/C and was escorted to lobby by RN. 

## 2017-04-20 NOTE — ED Triage Notes (Addendum)
Pt states new onset abdominal pain starting this morning to central abdomen radiating to the left. HX of chrons & ileostomy but states it does not feel the same. Denies N/V/D. Afebrile. Pain is constant 7/10. No urinary symptoms.

## 2017-07-14 ENCOUNTER — Ambulatory Visit: Payer: BC Managed Care – PPO | Admitting: Gastroenterology

## 2017-07-14 ENCOUNTER — Encounter: Payer: Self-pay | Admitting: Gastroenterology

## 2017-07-14 ENCOUNTER — Other Ambulatory Visit (INDEPENDENT_AMBULATORY_CARE_PROVIDER_SITE_OTHER): Payer: BC Managed Care – PPO

## 2017-07-14 VITALS — BP 108/64 | HR 101 | Ht 69.0 in | Wt 249.0 lb

## 2017-07-14 DIAGNOSIS — D509 Iron deficiency anemia, unspecified: Secondary | ICD-10-CM | POA: Diagnosis not present

## 2017-07-14 DIAGNOSIS — K50819 Crohn's disease of both small and large intestine with unspecified complications: Secondary | ICD-10-CM | POA: Diagnosis not present

## 2017-07-14 DIAGNOSIS — R1033 Periumbilical pain: Secondary | ICD-10-CM

## 2017-07-14 LAB — HEPATIC FUNCTION PANEL
ALBUMIN: 4.1 g/dL (ref 3.5–5.2)
ALK PHOS: 66 U/L (ref 39–117)
ALT: 20 U/L (ref 0–53)
AST: 17 U/L (ref 0–37)
BILIRUBIN TOTAL: 0.8 mg/dL (ref 0.2–1.2)
Bilirubin, Direct: 0.1 mg/dL (ref 0.0–0.3)
Total Protein: 7.2 g/dL (ref 6.0–8.3)

## 2017-07-14 LAB — CBC WITH DIFFERENTIAL/PLATELET
BASOS ABS: 0 10*3/uL (ref 0.0–0.1)
Basophils Relative: 1.1 % (ref 0.0–3.0)
Eosinophils Absolute: 0.1 10*3/uL (ref 0.0–0.7)
Eosinophils Relative: 2.6 % (ref 0.0–5.0)
HCT: 41.3 % (ref 39.0–52.0)
Hemoglobin: 13.7 g/dL (ref 13.0–17.0)
LYMPHS ABS: 1.4 10*3/uL (ref 0.7–4.0)
Lymphocytes Relative: 29.5 % (ref 12.0–46.0)
MCHC: 33.3 g/dL (ref 30.0–36.0)
MCV: 80.4 fl (ref 78.0–100.0)
Monocytes Absolute: 0.4 10*3/uL (ref 0.1–1.0)
Monocytes Relative: 9.3 % (ref 3.0–12.0)
NEUTROS ABS: 2.6 10*3/uL (ref 1.4–7.7)
NEUTROS PCT: 57.5 % (ref 43.0–77.0)
PLATELETS: 281 10*3/uL (ref 150.0–400.0)
RBC: 5.14 Mil/uL (ref 4.22–5.81)
RDW: 14.1 % (ref 11.5–15.5)
WBC: 4.6 10*3/uL (ref 4.0–10.5)

## 2017-07-14 LAB — IBC PANEL
Iron: 58 ug/dL (ref 42–165)
SATURATION RATIOS: 17.2 % — AB (ref 20.0–50.0)
Transferrin: 241 mg/dL (ref 212.0–360.0)

## 2017-07-14 LAB — VITAMIN B12: VITAMIN B 12: 567 pg/mL (ref 211–911)

## 2017-07-14 LAB — BASIC METABOLIC PANEL
BUN: 11 mg/dL (ref 6–23)
CALCIUM: 9.4 mg/dL (ref 8.4–10.5)
CHLORIDE: 106 meq/L (ref 96–112)
CO2: 27 meq/L (ref 19–32)
Creatinine, Ser: 1.29 mg/dL (ref 0.40–1.50)
GFR: 77.11 mL/min (ref >60.00)
GLUCOSE: 100 mg/dL — AB (ref 70–99)
Potassium: 3.5 mEq/L (ref 3.5–5.1)
SODIUM: 141 meq/L (ref 135–145)

## 2017-07-14 LAB — SEDIMENTATION RATE: Sed Rate: 19 mm/hr — ABNORMAL HIGH (ref 0–15)

## 2017-07-14 LAB — TSH: TSH: 0.96 u[IU]/mL (ref 0.35–4.50)

## 2017-07-14 LAB — FERRITIN: FERRITIN: 95.1 ng/mL (ref 22.0–322.0)

## 2017-07-14 LAB — C-REACTIVE PROTEIN: CRP: 1.1 mg/dL (ref 0.5–20.0)

## 2017-07-14 NOTE — Patient Instructions (Signed)
Your provider has requested that you go to the basement level for lab work before leaving today. Press "B" on the elevator. The lab is located at the first door on the left as you exit the elevator.  Normal BMI (Body Mass Index- based on height and weight) is between 19 and 25. Your BMI today is Body mass index is 36.77 kg/m. Marland Kitchen Please consider follow up  regarding your BMI with your Primary Care Provider.  Thank you for choosing me and [facility].  Pricilla Riffle. Dagoberto Ligas., MD., Marval Regal

## 2017-07-14 NOTE — Progress Notes (Signed)
    History of Present Illness: This is a 47 year old male with a history of Crohn's ileocolitis status post ileocecectomy.  He relates a 3 week history of periumbilical pain, gas, bloating and constipation that began while he was on a [place] cruise in April.  His symptoms abated and have not returned.  He was evaluated in [facility] ED in April and blood work showed AST=77, Hb=12.3, MCV=74.5, potassium=3.4, glucose=126, creatinine=1.35.  His Crohn's disease has been inactive for several years and he has not been treated with IBD medications for several years.  Denies weight loss, diarrhea, change in stool caliber, melena, hematochezia, nausea, vomiting, dysphagia, reflux symptoms, chest pain.   His last colonoscopy was in 08/2014 1. Erosions in the terminal ileum; multiple biopsies performed (active ileitis)  2. Prior ileocolonic surgical anastomosis in the ascending colon 3. Moderate sized Grade l internal hemorrhoids; injected   Current Medications, Allergies, Past Medical History, Past Surgical History, Family History and Social History were reviewed in [employer] record.  Physical Exam: General: Well developed, well nourished, no acute distress Head: Normocephalic and atraumatic Eyes:  sclerae anicteric, EOMI Ears: Normal auditory acuity Mouth: No deformity or lesions Lungs: Clear throughout to auscultation Heart: Regular rate and rhythm; no murmurs, rubs or bruits Abdomen: Soft, non tender and non distended. No masses, hepatosplenomegaly or hernias noted. Normal Bowel sounds Rectal: Not done Musculoskeletal: Symmetrical with no gross deformities  Pulses:  Normal pulses noted Extremities: No clubbing, cyanosis, edema or deformities noted Neurological: Alert oriented x 4, grossly nonfocal Psychological:  Alert and cooperative. Normal mood and affect  Assessment and Recommendations:  1. Suspected constipation with associated bloating and abdominal pain in April  which has completely resolved. CMP, CBC, TSH today.   2.  History of Crohn's ileocolitis with mild ileal inflammation noted at last colonoscopy.  ESR, CRP today.  We discussed proceeding with colonoscopy and an abdominal/pelvic CT to reassess Crohn's activity however he wants to defer at this time given his lack of symptoms.  REV in 6 months.   3.  Microcytic anemia. Send Fe, TIBC, ferritin, B12.

## 2017-09-02 ENCOUNTER — Telehealth: Payer: Self-pay | Admitting: Gastroenterology

## 2017-09-02 NOTE — Telephone Encounter (Signed)
Left message for patient to call back  

## 2017-09-02 NOTE — Telephone Encounter (Signed)
The pt has been having off/on diarrhea since Tuesday with 8 loose stools but decreasing to 1 episode today.  He has some occasional abd pain and cramping.  He was advised to try a clear liquid diet and imodium if he starts to have frequent loose stools.  If his pain increases, he has a fever or worse abd pain he will go to the ED for eval.  He will call on Monday with an update.

## 2018-01-20 ENCOUNTER — Encounter: Payer: Self-pay | Admitting: Nurse Practitioner

## 2018-01-20 ENCOUNTER — Other Ambulatory Visit: Payer: Self-pay

## 2018-01-20 ENCOUNTER — Ambulatory Visit: Payer: BC Managed Care – PPO | Admitting: Nurse Practitioner

## 2018-01-20 VITALS — BP 120/70 | HR 100 | Temp 98.4°F | Ht 69.8 in | Wt 244.8 lb

## 2018-01-20 DIAGNOSIS — I10 Essential (primary) hypertension: Secondary | ICD-10-CM

## 2018-01-20 DIAGNOSIS — R7309 Other abnormal glucose: Secondary | ICD-10-CM

## 2018-01-20 DIAGNOSIS — Z6835 Body mass index (BMI) 35.0-35.9, adult: Secondary | ICD-10-CM

## 2018-01-20 DIAGNOSIS — K5080 Crohn's disease of both small and large intestine without complications: Secondary | ICD-10-CM

## 2018-01-20 NOTE — Progress Notes (Signed)
Subjective:     Patient ID: [name] [name] , male    DOB: [birth year] , 48 y.o.   MRN: [id number]   Chief Complaint  Patient presents with  . Hypertension    HPI  Hypertension  This is a chronic problem. The current episode started more than 1 year ago. The problem is unchanged. The problem is controlled. Pertinent negatives include no anxiety, blurred vision, chest pain, headaches, malaise/fatigue or palpitations. There are no associated agents to hypertension. Risk factors for coronary artery disease include male gender, obesity and sedentary lifestyle. Past treatments include beta blockers. The current treatment provides significant improvement. There are no compliance problems.  There is no history of angina. There is no history of chronic renal disease.     Past Medical History:  Diagnosis Date  . Anemia    iron def  . B12 deficiency   . Crohn's ileocolitis ([place])    42m since 2007  . Hemorrhoids   . Hypertension   . Prehypertension 03/09/2013  . Pulmonary embolism (HCC)      Family History  Problem Relation Age of Onset  . Breast cancer Mother   . Diabetes Father   . Crohn's disease Other   . Colon cancer Neg Hx      Current Outpatient Medications:  .  aspirin EC 81 MG tablet, Take 81 mg by mouth daily., Disp: , Rfl:  .  cholecalciferol (VITAMIN D) 1000 units tablet, Take 1,000 Units by mouth daily., Disp: , Rfl:  .  metoprolol succinate (TOPROL-XL) 25 MG 24 hr tablet, Take 25 mg by mouth daily. , Disp: , Rfl: 2 .  Multiple Vitamin (MULTIVITAMIN WITH MINERALS) TABS tablet, Take 1 tablet by mouth daily., Disp: , Rfl:  .  pantoprazole (PROTONIX) 40 MG tablet, TAKE 1 TABLET (40 MG TOTAL) BY MOUTH DAILY., Disp: 30 tablet, Rfl: 2   No Known Allergies   Review of Systems  Constitutional: Negative.  Negative for fatigue and malaise/fatigue.  Eyes: Negative for blurred vision.  Respiratory: Negative.   Cardiovascular: Negative.  Negative for chest pain, palpitations and  leg swelling.  Neurological: Negative for dizziness and headaches.     Today's Vitals   01/20/18 0858  BP: 120/70  Pulse: 100  Temp: 98.4 F (36.9 C)  TempSrc: Oral  SpO2: 96%  Weight: 244 lb 12.8 oz (111 kg)  Height: 5' 9.8" (1.773 m)   Body mass index is 35.33 kg/m.   Objective:  Physical Exam Vitals signs reviewed.  Cardiovascular:     Rate and Rhythm: Normal rate and regular rhythm.     Pulses: Normal pulses.     Heart sounds: Normal heart sounds.  Pulmonary:     Effort: Pulmonary effort is normal.     Breath sounds: Normal breath sounds.  Skin:    General: Skin is warm and dry.     Capillary Refill: Capillary refill takes less than 2 seconds.  Neurological:     General: No focal deficit present.     Mental Status: He is alert and oriented to person, place, and time.         Assessment And Plan:     1. Abnormal glucose  Chronic, controlled  Continue with current medications  Encouraged to limit intake of sugary foods and drinks  Encouraged to increase physical activity to 150 minutes per week  2. Essential hypertension . B/P is controlled.  . CMP ordered to check renal function.  . The importance of regular exercise and  dietary modification was stressed to the patient.  . Stressed importance of losing ten percent of her body weight to help with B/P control.  . The weight loss would help with decreasing cardiac and cancer risk as well.   3. Crohn's disease of both small and large intestine without complication (HCC)  No recent exacerbations  4. Body mass index (BMI) of 35.0-35.9 in adult Chronic Discussed healthy diet and regular exercise options  Encouraged to exercise at least 150 minutes per week with 2 days of strength training   [name], FNP

## 2018-01-21 LAB — HEMOGLOBIN A1C
Est. average glucose Bld gHb Est-mCnc: 117 mg/dL
HEMOGLOBIN A1C: 5.7 % — AB (ref 4.8–5.6)

## 2018-01-21 LAB — BMP8+EGFR
BUN/Creatinine Ratio: 8 — ABNORMAL LOW (ref 9–20)
BUN: 10 mg/dL (ref 6–24)
CALCIUM: 9.5 mg/dL (ref 8.7–10.2)
CHLORIDE: 104 mmol/L (ref 96–106)
CO2: 24 mmol/L (ref 20–29)
Creatinine, Ser: 1.31 mg/dL — ABNORMAL HIGH (ref 0.76–1.27)
GFR calc non Af Amer: 65 mL/min/{1.73_m2} (ref >59)
GFR, EST AFRICAN AMERICAN: 75 mL/min/{1.73_m2} (ref >59)
GLUCOSE: 87 mg/dL (ref 65–99)
POTASSIUM: 4.5 mmol/L (ref 3.5–5.2)
Sodium: 140 mmol/L (ref 134–144)

## 2018-01-28 ENCOUNTER — Encounter: Payer: Self-pay | Admitting: Gastroenterology

## 2018-02-17 ENCOUNTER — Other Ambulatory Visit: Payer: Self-pay

## 2018-02-17 ENCOUNTER — Ambulatory Visit: Payer: BC Managed Care – PPO | Admitting: Nurse Practitioner

## 2018-02-17 ENCOUNTER — Encounter: Payer: Self-pay | Admitting: Nurse Practitioner

## 2018-02-17 VITALS — BP 132/72 | HR 115 | Temp 98.7°F | Ht 70.0 in | Wt 242.0 lb

## 2018-02-17 DIAGNOSIS — J069 Acute upper respiratory infection, unspecified: Secondary | ICD-10-CM | POA: Diagnosis not present

## 2018-02-17 MED ORDER — AMOXICILLIN-POT CLAVULANATE 875-125 MG PO TABS: 1.0000 | ORAL_TABLET | Freq: Two times a day (BID) | ORAL | 0 refills | Status: AC

## 2018-02-17 NOTE — Progress Notes (Signed)
Subjective:     Patient ID: [name] [name] , male    DOB: Feb 04, 1970 , 48 y.o.   MRN: [id number]   Chief Complaint  Patient presents with  . Cough    chills/mucus/headache/ about a month but worse this week    HPI  URI   This is a recurrent problem. The current episode started 1 to 4 weeks ago (since Feb 2nd has been having cold symptoms). The problem has been gradually worsening. There has been no fever. Associated symptoms include congestion and coughing (sometimes productive). Pertinent negatives include no abdominal pain, chest pain, headaches, nausea, sinus pain, sneezing or sore throat. Associated symptoms comments: headache. He has tried decongestant Nurse, adult) for the symptoms. The treatment provided mild relief.     Past Medical History:  Diagnosis Date  . Anemia    iron def  . B12 deficiency   . Crohn's ileocolitis ([place])    47m since 2007  . Hemorrhoids   . Hypertension   . Prehypertension 03/09/2013  . Pulmonary embolism (HCC)      Family History  Problem Relation Age of Onset  . Breast cancer Mother   . Diabetes Father   . Crohn's disease Other   . Colon cancer Neg Hx      Current Outpatient Medications:  .  aspirin EC 81 MG tablet, Take 81 mg by mouth daily., Disp: , Rfl:  .  cholecalciferol (VITAMIN D) 1000 units tablet, Take 1,000 Units by mouth daily., Disp: , Rfl:  .  metoprolol succinate (TOPROL-XL) 25 MG 24 hr tablet, Take 25 mg by mouth daily. , Disp: , Rfl: 2 .  Multiple Vitamin (MULTIVITAMIN WITH MINERALS) TABS tablet, Take 1 tablet by mouth daily., Disp: , Rfl:  .  pantoprazole (PROTONIX) 40 MG tablet, TAKE 1 TABLET (40 MG TOTAL) BY MOUTH DAILY. (Patient not taking: Reported on 02/17/2018), Disp: 30 tablet, Rfl: 2   No Known Allergies   Review of Systems  Constitutional: Positive for chills and fatigue. Negative for fever.  HENT: Positive for congestion. Negative for sinus pressure, sinus pain, sneezing and sore throat.   Respiratory:  Positive for cough (sometimes productive).   Cardiovascular: Negative for chest pain, palpitations and leg swelling.  Gastrointestinal: Negative for abdominal pain and nausea.  Endocrine: Negative for polydipsia, polyphagia and polyuria.  Musculoskeletal: Negative.   Neurological: Negative for headaches.     Today's Vitals   02/17/18 0835  BP: 132/72  Pulse: (!) 115  Temp: 98.7 F (37.1 C)  TempSrc: Oral  SpO2: 96%  Weight: 242 lb (109.8 kg)  Height: 5' 10"  (1.778 m)   Body mass index is 34.72 kg/m.   Objective:  Physical Exam Vitals signs reviewed.  Constitutional:      Appearance: Normal appearance.  HENT:     Head: Normocephalic and atraumatic.     Right Ear: Tympanic membrane, ear canal and external ear normal. There is no impacted cerumen.     Left Ear: Tympanic membrane, ear canal and external ear normal. There is no impacted cerumen.     Nose: Congestion present.     Mouth/Throat:     Mouth: Mucous membranes are moist.     Comments: Oropharynx is erythematous with post nasal drainage Eyes:     Extraocular Movements: Extraocular movements intact.     Conjunctiva/sclera: Conjunctivae normal.     Pupils: Pupils are equal, round, and reactive to light.  Neck:     Musculoskeletal: Normal range of motion and neck supple.  No neck rigidity or muscular tenderness.  Cardiovascular:     Rate and Rhythm: Tachycardia present.     Pulses: Normal pulses.     Heart sounds: Normal heart sounds. No murmur.  Pulmonary:     Effort: Pulmonary effort is normal. No respiratory distress.     Breath sounds: Normal breath sounds. No wheezing.  Chest:     Chest wall: No tenderness.  Lymphadenopathy:     Cervical: No cervical adenopathy.  Skin:    Capillary Refill: Capillary refill takes less than 2 seconds.  Neurological:     General: No focal deficit present.     Mental Status: He is alert and oriented to person, place, and time.  Psychiatric:        Mood and Affect: Mood  normal.        Behavior: Behavior normal.        Thought Content: Thought content normal.        Judgment: Judgment normal.         Assessment And Plan:     1. Upper respiratory tract infection, unspecified type  Reoccurring symptoms over the last month  Symptomatic treatment for cough, encouraged to take Delsym as needed  Norel AD samples given advised to not take with Tylenol and no more than 3 days in a row  Increase fluid intake and rest - amoxicillin-clavulanate (AUGMENTIN) 875-125 MG tablet; Take 1 tablet by mouth 2 (two) times daily for 7 days.  Dispense: 14 tablet; Refill: 0    [name], FNP

## 2018-02-17 NOTE — Patient Instructions (Addendum)
Upper Respiratory Infection, Adult An upper respiratory infection (URI) affects the nose, throat, and upper air passages. URIs are caused by germs (viruses). The most common type of URI is often called "the common cold." Medicines cannot cure URIs, but you can do things at home to relieve your symptoms. URIs usually get better within 7-10 days. Follow these instructions at home: Activity  Rest as needed.  If you have a fever, stay home from work or school until your fever is gone, or until your doctor says you may return to work or school. ? You should stay home until you cannot spread the infection anymore (you are not contagious). ? Your doctor may have you wear a face mask so you have less risk of spreading the infection. Relieving symptoms  Gargle with a salt-water mixture 3-4 times a day or as needed. To make a salt-water mixture, completely dissolve -1 tsp of salt in 1 cup of warm water.  Use a cool-mist humidifier to add moisture to the air. This can help you breathe more easily. Eating and drinking   Drink enough fluid to keep your pee (urine) pale yellow.  Eat soups and other clear broths. General instructions   Take over-the-counter and prescription medicines only as told by your doctor. These include cold medicines, fever reducers, and cough suppressants.  Do not use any products that contain nicotine or tobacco. These include cigarettes and e-cigarettes. If you need help quitting, ask your doctor.  Avoid being where people are smoking (avoid secondhand smoke).  Make sure you get regular shots and get the flu shot every year.  Keep all follow-up visits as told by your doctor. This is important. How to avoid spreading infection to others   Wash your hands often with soap and water. If you do not have soap and water, use hand sanitizer.  Avoid touching your mouth, face, eyes, or nose.  Cough or sneeze into a tissue or your sleeve or elbow. Do not cough or sneeze  into your hand or into the air. Contact a doctor if:  You are getting worse, not better.  You have any of these: ? A fever. ? Chills. ? Brown or red mucus in your nose. ? Yellow or brown fluid (discharge)coming from your nose. ? Pain in your face, especially when you bend forward. ? Swollen neck glands. ? Pain with swallowing. ? White areas in the back of your throat. Get help right away if:  You have shortness of breath that gets worse.  You have very bad or constant: ? Headache. ? Ear pain. ? Pain in your forehead, behind your eyes, and over your cheekbones (sinus pain). ? Chest pain.  You have long-lasting (chronic) lung disease along with any of these: ? Wheezing. ? Long-lasting cough. ? Coughing up blood. ? A change in your usual mucus.  You have a stiff neck.  You have changes in your: ? Vision. ? Hearing. ? Thinking. ? Mood. Summary  An upper respiratory infection (URI) is caused by a germ called a virus. The most common type of URI is often called "the common cold."  URIs usually get better within 7-10 days.  Take over-the-counter and prescription medicines only as told by your doctor. This information is not intended to replace advice given to you by your health care provider. Make sure you discuss any questions you have with your health care provider. Document Released: 06/09/2007 Document Revised: 08/13/2016 Document Reviewed: 08/13/2016 Elsevier Interactive Patient Education  2019 [employer].  Delsym DM to suppress the cough  Drink plenty of fluids and rest

## 2018-05-25 ENCOUNTER — Telehealth: Payer: Self-pay | Admitting: Gastroenterology

## 2018-05-25 NOTE — Telephone Encounter (Signed)
Patient has Crohn's and is told to go back to work next week. He would like to know if it is safe to go back.

## 2018-05-25 NOTE — Telephone Encounter (Signed)
Patient has been working from home and has been called back to work.  He is advised that he is safe to return to work with handwashing and appropriate social distancing.

## 2018-07-04 ENCOUNTER — Telehealth: Payer: Self-pay

## 2018-07-04 NOTE — Telephone Encounter (Signed)
Patient called asking if we were seeing patients on fridays because he would like an appointment because he has been feeling tired and sluggish he feels it may be his testosterone and he stated he had some other concerns I have returned pt call and scheduled him for a virtual appointment for tomorrow. YRL,RMA

## 2018-07-05 ENCOUNTER — Other Ambulatory Visit: Payer: Self-pay

## 2018-07-05 ENCOUNTER — Ambulatory Visit (INDEPENDENT_AMBULATORY_CARE_PROVIDER_SITE_OTHER): Payer: BC Managed Care – PPO | Admitting: Nurse Practitioner

## 2018-07-05 ENCOUNTER — Encounter: Payer: Self-pay | Admitting: Nurse Practitioner

## 2018-07-05 DIAGNOSIS — R0683 Snoring: Secondary | ICD-10-CM | POA: Insufficient documentation

## 2018-07-05 DIAGNOSIS — N529 Male erectile dysfunction, unspecified: Secondary | ICD-10-CM | POA: Insufficient documentation

## 2018-07-05 DIAGNOSIS — R5383 Other fatigue: Secondary | ICD-10-CM | POA: Diagnosis not present

## 2018-07-05 NOTE — Progress Notes (Signed)
Virtual Visit via Video   This visit type was conducted due to national recommendations for restrictions regarding the COVID-19 Pandemic (e.g. social distancing) in an effort to limit this patient's exposure and mitigate transmission in our community.  Due to his co-morbid illnesses, this patient is at least at moderate risk for complications without adequate follow up.  This format is felt to be most appropriate for this patient at this time.  All issues noted in this document were discussed and addressed.  A limited physical exam was performed with this format.    This visit type was conducted due to national recommendations for restrictions regarding the COVID-19 Pandemic (e.g. social distancing) in an effort to limit this patient's exposure and mitigate transmission in our community.  Patients identity confirmed using two different identifiers.  This format is felt to be most appropriate for this patient at this time.  All issues noted in this document were discussed and addressed.  No physical exam was performed (except for noted visual exam findings with Video Visits).    Date:  07/05/2018   ID:  [name] [name], DOB [birth year], MRN [id number]  Patient Location:  Home - spoke with [name] [name]  Provider location:   Office    Chief Complaint:  fatigue  History of Present Illness:    [name] [name] is a 48 y.o. male who presents via video conferencing for a telehealth visit today.    The patient does not have symptoms concerning for COVID-19 infection (fever, chills, cough, or new shortness of breath).   Fatigue - last month began to be more fatigued.  Denies any other symptoms.  He does work 2 jobs.  He has previously had a low testosterone.   He is having difficulty with erection and he is concerned about his testosterone.      Past Medical History:  Diagnosis Date  . Anemia    iron def  . B12 deficiency   . Crohn's ileocolitis ([place])    81m since 2007  . Hemorrhoids    . Hypertension   . Prehypertension 03/09/2013  . Pulmonary embolism ([facility]    Past Surgical History:  Procedure Laterality Date  . COLONOSCOPY    . EXTERNAL FIXATION LEG Left 02/27/2013   Procedure: EXTERNAL FIXATION LEFT KNEE ;  Surgeon: [name] MD;  [facility]  Service: Orthopedics;  Laterality: Left;  . EXTERNAL FIXATION LEG Left 03/01/2013   Procedure: REVISION OF EXTERNAL FIXATOR LEFT LEG;  Surgeon: [name] MD;  [facility]  Service: Orthopedics;  Laterality: Left;  . ileocecal resection  03/2005  . ILEOSTOMY CLOSURE  08/2005  . ileostomy/anastomotic resection for anastomotic leak  03/2005  . KNEE ARTHROSCOPY Left 06/25/2013   Procedure: LYSIS OF ADHESION LEFT KNEE;  Surgeon: [name] MD;  [facility]  Service: Orthopedics;  Laterality: Left;  . LYSIS OF ADHESION Left 06/25/2013   WITH MANIPULATION     DR HANDY  . ORIF TIBIA FRACTURE Left 03/08/2013   Procedure: LEFT OPEN REDUCTION INTERNAL FIXATION (ORIF) TIBIA FRACTURE;  Surgeon: [name] MD;  [facility]  Service: Orthopedics;  Laterality: Left;     Current Meds  Medication Sig  . aspirin EC 81 MG tablet Take 81 mg by mouth daily.  . cholecalciferol (VITAMIN D) 1000 units tablet Take 1,000 Units by mouth daily.  . metoprolol succinate (TOPROL-XL) 25 MG 24 hr tablet Take 25 mg by mouth daily.   . Multiple  Vitamin (MULTIVITAMIN WITH MINERALS) TABS tablet Take 1 tablet by mouth daily.     Allergies:   Patient has no known allergies.   Social History   Tobacco Use  . Smoking status: Never Smoker  . Smokeless tobacco: Never Used  Substance Use Topics  . Alcohol use: No  . Drug use: No     Family Hx: The patient's family history includes Breast cancer in his mother; Crohn's disease in an other family member; Diabetes in his father. There is no history of Colon cancer.  ROS:   Please see the history of present illness.    Review of Systems  Constitutional: Positive for  malaise/fatigue. Negative for chills and fever.  Respiratory: Negative.   Cardiovascular: Negative.   Neurological: Negative for dizziness and headaches.  Psychiatric/Behavioral: Negative.     All other systems reviewed and are negative.   Labs/Other Tests and Data Reviewed:    Recent Labs: 07/14/2017: ALT 20; Hemoglobin 13.7; Platelets 281.0; TSH 0.96 01/20/2018: BUN 10; Creatinine, Ser 1.31; Potassium 4.5; Sodium 140   Recent Lipid Panel No results found for: CHOL, TRIG, HDL, CHOLHDL, LDLCALC, LDLDIRECT  Wt Readings from Last 3 Encounters:  02/17/18 242 lb (109.8 kg)  01/20/18 244 lb 12.8 oz (111 kg)  07/14/17 249 lb (112.9 kg)     Exam:    Vital Signs:  There were no vitals taken for this visit.    Physical Exam  Constitutional: He is oriented to person, place, and time and well-developed, well-nourished, and in no distress.  Pulmonary/Chest: Effort normal. No respiratory distress.  Neurological: He is alert and oriented to person, place, and time.  Psychiatric: Memory, affect and judgment normal.    ASSESSMENT & PLAN:    1. Fatigue, unspecified type  Reoccurrence of fatigue  Will check for metabolic causes  Pending results will refer to urology for further evaluation if related to his testosterone - Testosterone, Total; Future - TSH; Future - Vitamin B12; Future - CBC no Diff; Future - Ambulatory referral to Sleep Studies - HIV antibody (with reflex)  2. Erectile dysfunction, unspecified erectile dysfunction type  Having more difficulty with initiation  Will check testosterone to see if this could be the cause   He is interested in having children so would be best for him to see a urologist and possibly a infertility specialist  3. Snoring  Poor sleep can be causing his fatigue and increase his risk for cardiac disease  Will refer for sleep study - Ambulatory referral to Sleep Studies   COVID-19 Education: The signs and symptoms of COVID-19 were  discussed with the patient and how to seek care for testing (follow up with PCP or arrange E-visit).  The importance of social distancing was discussed today.  Patient Risk:   After full review of this patients clinical status, I feel that they are at least moderate risk at this time.  Time:   Today, I have spent 17 minutes/ seconds with the patient with telehealth technology discussing above diagnoses.     Medication Adjustments/Labs and Tests Ordered: Current medicines are reviewed at length with the patient today.  Concerns regarding medicines are outlined above.   Tests Ordered: No orders of the defined types were placed in this encounter.   Medication Changes: No orders of the defined types were placed in this encounter.   Disposition:  Follow up prn  Signed, [name], FNP

## 2018-07-06 ENCOUNTER — Other Ambulatory Visit: Payer: Self-pay

## 2018-07-06 ENCOUNTER — Other Ambulatory Visit: Payer: BC Managed Care – PPO

## 2018-07-06 DIAGNOSIS — R5383 Other fatigue: Secondary | ICD-10-CM

## 2018-07-07 LAB — CBC
Hematocrit: 44.1 % (ref 37.5–51.0)
Hemoglobin: 14 g/dL (ref 13.0–17.7)
MCH: 26.4 pg — ABNORMAL LOW (ref 26.6–33.0)
MCHC: 31.7 g/dL (ref 31.5–35.7)
MCV: 83 fL (ref 79–97)
Platelets: 292 10*3/uL (ref 150–450)
RBC: 5.3 x10E6/uL (ref 4.14–5.80)
RDW: 13.7 % (ref 11.6–15.4)
WBC: 4.1 10*3/uL (ref 3.4–10.8)

## 2018-07-07 LAB — TSH: TSH: 1.45 u[IU]/mL (ref 0.450–4.500)

## 2018-07-07 LAB — TESTOSTERONE: Testosterone: 253 ng/dL — ABNORMAL LOW (ref 264–916)

## 2018-07-07 LAB — VITAMIN B12: Vitamin B-12: 554 pg/mL (ref 232–1245)

## 2018-07-08 LAB — HIV ANTIBODY (ROUTINE TESTING W REFLEX): HIV Screen 4th Generation wRfx: NONREACTIVE

## 2018-07-10 ENCOUNTER — Other Ambulatory Visit: Payer: Self-pay | Admitting: Nurse Practitioner

## 2018-07-10 DIAGNOSIS — R7989 Other specified abnormal findings of blood chemistry: Secondary | ICD-10-CM

## 2018-07-10 DIAGNOSIS — N529 Male erectile dysfunction, unspecified: Secondary | ICD-10-CM

## 2018-07-17 ENCOUNTER — Ambulatory Visit: Payer: BC Managed Care – PPO | Admitting: Neurology

## 2018-07-17 ENCOUNTER — Encounter: Payer: Self-pay | Admitting: Neurology

## 2018-07-17 ENCOUNTER — Other Ambulatory Visit: Payer: Self-pay

## 2018-07-17 VITALS — BP 123/84 | HR 86 | Ht 69.0 in | Wt 252.0 lb

## 2018-07-17 DIAGNOSIS — R0683 Snoring: Secondary | ICD-10-CM

## 2018-07-17 DIAGNOSIS — R5383 Other fatigue: Secondary | ICD-10-CM | POA: Diagnosis not present

## 2018-07-17 DIAGNOSIS — R351 Nocturia: Secondary | ICD-10-CM | POA: Diagnosis not present

## 2018-07-17 DIAGNOSIS — E669 Obesity, unspecified: Secondary | ICD-10-CM

## 2018-07-17 DIAGNOSIS — Z820 Family history of epilepsy and other diseases of the nervous system: Secondary | ICD-10-CM

## 2018-07-17 NOTE — Patient Instructions (Signed)

## 2018-07-17 NOTE — Progress Notes (Signed)
Subjective:    Patient ID: [name] [name] [name] is [name] 48 y.o. male.  HPI     [name] [name] [name],   I saw your patient, [name] [name] [name], upon your kind request in my sleep clinic today for initial consultation of his sleep disorder, in particular, concern for underlying obstructive sleep apnea.  The patient is unaccompanied today.  As you know, [name] [name] [name] is [name] 48 year old right-handed man with an underlying medical history of B12 deficiency, Crohn's disease, hypertension, history of pulmonary embolism, iron deficiency anemia, and obesity, who reports snoring and excessive daytime somnolence.  I reviewed your virtual visit note from 07/05/2018. His Epworth sleepiness score is 6 out of 24, fatigue severity score is 32 out of 63.  He notices tiredness during the day and the need for yawning in the afternoons, he has felt tired for the past year.  His wife has been bothered by his snoring which can be loud and particularly worse when he sleeps on his back.  Bedtime is generally between 10 and 11 and rise time around 6.  He has nocturia about once or twice per average night and denies recurrent morning headaches or restless leg symptoms.  He believes his brother has sleep apnea and has [name] CPAP machine.  He lives with his wife, he works full-time at [name] in [employer] and also for the city and the [name] [name].  He is [name] non-smoker and currently does not utilize any alcohol, he drinks caffeine in the form of soda, 1 or 2 every other day or so, tea, 1 [name] day on average, no coffee.  He is [name] restless sleeper.  His Past Medical History Is Significant For: Past Medical History:  Diagnosis Date  . Anemia    iron def  . B12 deficiency   . 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 Hemorrhoids   . Hypertension   . Prehypertension 03/09/2013  . Pulmonary embolism ([facility]     His  Past Surgical History Is Significant For: Past Surgical History:  Procedure Laterality Date  . COLONOSCOPY    . EXTERNAL FIXATION LEG Left 02/27/2013   Procedure: EXTERNAL FIXATION LEFT KNEE ;  Surgeon: [name] [name] [name];  Location: [facility]  Service: Orthopedics;  Laterality: Left;  . EXTERNAL FIXATION LEG Left 03/01/2013   Procedure: REVISION OF EXTERNAL FIXATOR LEFT LEG;  Surgeon: [name] [name] [name];  [facility]  Service: Orthopedics;  Laterality: Left;  . ileocecal resection  03/2005  . ILEOSTOMY CLOSURE  08/2005  . ileostomy/anastomotic resection for anastomotic leak  03/2005  . KNEE ARTHROSCOPY Left 06/25/2013   Procedure: LYSIS OF ADHESION LEFT KNEE;  Surgeon: [name] [name] [name];  [facility]  Service: Orthopedics;  Laterality: Left;  . LYSIS OF ADHESION Left 06/25/2013   WITH MANIPULATION     [name] [name]  . ORIF TIBIA FRACTURE Left 03/08/2013   Procedure: LEFT OPEN REDUCTION INTERNAL FIXATION (ORIF) TIBIA FRACTURE;  Surgeon: [name] [name] [name];  [facility]  Service: Orthopedics;  Laterality: Left;    [name] [name] [name]  . Breast cancer Mother   . Diabetes Father   . Crohn's disease Other   . Colon cancer Neg Hx     His Social History Is Significant For: Social History   Socioeconomic  History  . Marital status: Married    Spouse name: Not on file  . Number of children: 0  . Years of education: Not on file  . Highest education level: Not on file  Occupational History  . Occupation: Production assistant, radio:  [name] [name] [name]  Social Needs  . Financial resource strain: Not on file  . Food insecurity    Worry: Not on file    Inability: Not on file  . Transportation needs    Medical: Not on file    Non-medical: Not on file  Tobacco Use  . Smoking status: Never Smoker  . Smokeless tobacco: Never Used  Substance and Sexual Activity  . Alcohol use: No  . Drug use: No  . Sexual  activity: Yes    Birth control/protection: None, Condom  Lifestyle  . Physical activity    Days per week: Not on file    Minutes per session: Not on file  . Stress: Not on file  Relationships  . Social Herbalist on phone: Not on file    Gets together: Not on file    Attends religious service: Not on file    Active member of club or organization: Not on file    Attends meetings of clubs or organizations: Not on file    Relationship status: Not on file  Other Topics Concern  . Not on file  Social History Narrative   Patient does not get regular exercise. Daily caffeine use sodas-4 cups daily.    His Allergies Are:  No Known Allergies:   His Current Medications Are:  Outpatient Encounter Medications as of 07/17/2018  Medication Sig  . aspirin EC 81 MG tablet Take 81 mg by mouth daily.  . cholecalciferol (VITAMIN D) 1000 units tablet Take 1,000 Units by mouth daily.  . metoprolol succinate (TOPROL-XL) 25 MG 24 hr tablet Take 25 mg by mouth daily.   . Multiple Vitamin (MULTIVITAMIN WITH MINERALS) TABS tablet Take 1 tablet by mouth daily.  . pantoprazole (PROTONIX) 40 MG tablet TAKE 1 TABLET (40 MG TOTAL) BY MOUTH DAILY. (Patient not taking: Reported on 02/17/2018)   No facility-administered encounter medications on file as of 07/17/2018.   :  Review of Systems:  Out of [name] complete 14 point review of systems, all are reviewed and negative with the exception of these symptoms as listed below: Review of Systems  Neurological:       Pt presents today to discuss his sleep. Pt has never had [name] sleep study but does endorse snoring.  Epworth Sleepiness Scale 0= would never doze 1= slight chance of dozing 2= moderate chance of dozing 3= high chance of dozing  Sitting and reading: 1 Watching TV: 2 Sitting inactive in [name] public place (ex. Theater or meeting): 1 As [name] passenger in [name] car for an hour without [name] break: 1 Lying down to rest in the afternoon: 1 Sitting and talking  to someone: 0 Sitting quietly after lunch (no alcohol): 0 In [name] car, while stopped in traffic: 0 Total: 6     Objective:  Neurological Exam  Physical Exam Physical Examination:   Vitals:   07/17/18 1054  BP: 123/84  Pulse: 86    General Examination: The patient is [name] very pleasant 48 y.o. male in no acute distress. He appears well-developed and well-nourished and well groomed.   HEENT: Normocephalic, atraumatic, pupils are equal, round and reactive to light and accommodation. Extraocular tracking is good  without limitation to gaze excursion or nystagmus noted. Normal smooth pursuit is noted. Hearing is grossly intact. Face is symmetric with normal facial animation and normal facial sensation. Speech is clear with no dysarthria noted. There is no hypophonia. There is no lip, neck/head, jaw or voice tremor. Neck is supple with full range of passive and active motion. There are no carotid bruits on auscultation. Oropharynx exam reveals: mild mouth dryness, adequate dental hygiene and moderate airway crowding, due to Wider tongue, uvula appears larger, tonsils about 1+ bilaterally.  Mallampati is class II.  Tongue protrudes centrally in palate elevates symmetrically.  Neck circumference is 14-1/4 inches.  He has [name] minimal overbite.   Chest: Clear to auscultation without wheezing, rhonchi or crackles noted.  Heart: S1+S2+0, regular and normal without murmurs, rubs or gallops noted.   Abdomen: Soft, non-tender and non-distended with normal bowel sounds appreciated on auscultation.  Extremities: There is no pitting edema in the distal lower extremities bilaterally.  Skin: Warm and dry without trophic changes noted.  Musculoskeletal: exam reveals no obvious joint deformities, tenderness or joint swelling or erythema, With the exception of some discomfort in the left knee.  Neurologically:  Mental status: The patient is awake, alert and oriented in all 4 spheres. His immediate and remote  memory, attention, language skills and fund of knowledge are appropriate. There is no evidence of aphasia, agnosia, apraxia or anomia. Speech is clear with normal prosody and enunciation. Thought process is linear. Mood is normal and affect is normal.  Cranial nerves II - XII are as described above under HEENT exam. In addition: shoulder shrug is normal with equal shoulder height noted. Motor exam: Normal bulk, strength and tone is noted. There is no drift, tremor or rebound. Romberg is negative. Reflexes are 1+, absent in the L knee. Fine motor skills and coordination: intact with normal finger taps, normal hand movements, normal rapid alternating patting, normal foot taps and normal foot agility.  Cerebellar testing: No dysmetria or intention tremor. There is no truncal or gait ataxia.  Sensory exam: intact to light touch in the upper and lower extremities.  Gait, station and balance: He stands easily. No veering to one side is noted. No leaning to one side is noted. Posture is [name] [name]-appropriate and stance is narrow based. Gait shows normal stride length and normal pace. No problems turning are noted. Tandem walk is unremarkable.   Assessment and Plan:  In summary, Berkley C Celaya is [name] very pleasant 48 y.o.-year old male with an underlying medical history of B12 deficiency, Crohn's disease, hypertension, history of pulmonary embolism, iron deficiency anemia, and obesity, whose history and physical exam are concerning for obstructive sleep apnea (OSA). I had [name] long chat with the patient about my findings and the diagnosis of OSA, its prognosis and treatment options. We talked about medical treatments, surgical interventions and non-pharmacological approaches. I explained in particular the risks and ramifications of untreated moderate to severe OSA, especially with respect to developing cardiovascular disease down the Road, including congestive heart failure, difficult to treat hypertension, cardiac  arrhythmias, or stroke. Even type 2 diabetes has, in part, been linked to untreated OSA. Symptoms of untreated OSA include daytime sleepiness, memory problems, mood irritability and mood disorder such as depression and anxiety, lack of [name], as well as recurrent headaches, especially morning headaches. We talked about trying to maintain [name] healthy lifestyle in general, as well as the importance of weight control. I encouraged the patient to eat healthy, exercise daily and keep  well hydrated, to keep [name] scheduled bedtime and wake time routine, to not skip any meals and eat healthy snacks in between meals. I advised the patient not to drive when feeling sleepy. I recommended the following at this time: sleep study.   I explained the sleep test procedure to the patient and also outlined possible surgical and non-surgical treatment options of OSA, including the use of [name] custom-made dental device. I also explained the CPAP treatment option to the patient, who indicated that he would be willing to try CPAP if the need arises. I explained the importance of being compliant with PAP treatment, not only for insurance purposes but primarily to improve His symptoms, and for the patient's long term health benefit, including to reduce His cardiovascular risks. I answered all his questions today and the patient was in agreement. I would like to see him back after the sleep study is completed and encouraged him to call with any interim questions, concerns, problems or updates.   Thank you very much for allowing me to participate in the care of this nice patient. If I can be of any further assistance to you please do not hesitate to call me at [phone].  Sincerely,   [name] Age, MD, PhD

## 2018-07-18 ENCOUNTER — Encounter (HOSPITAL_COMMUNITY): Payer: Self-pay

## 2018-07-18 ENCOUNTER — Emergency Department (HOSPITAL_COMMUNITY)
Admission: EM | Admit: 2018-07-18 | Discharge: 2018-07-19 | Disposition: A | Discharge status: Home / Self Care | Payer: BC Managed Care – PPO | Attending: Emergency Medicine | Admitting: Emergency Medicine

## 2018-07-18 ENCOUNTER — Other Ambulatory Visit: Payer: Self-pay

## 2018-07-18 DIAGNOSIS — F419 Anxiety disorder, unspecified: Secondary | ICD-10-CM | POA: Diagnosis present

## 2018-07-18 DIAGNOSIS — Z7982 Long term (current) use of aspirin: Secondary | ICD-10-CM | POA: Insufficient documentation

## 2018-07-18 DIAGNOSIS — F410 Panic disorder [episodic paroxysmal anxiety] without agoraphobia: Secondary | ICD-10-CM

## 2018-07-18 NOTE — ED Notes (Signed)
Pt stated had an episode of feeling anxious earlier today while at his part time job. Stated the feeling has subsided at this time and feeling better. Pt in chair resting comfortable w/ VSS; denies pain when asked.

## 2018-07-18 NOTE — ED Triage Notes (Signed)
Pt reports anxiety, trouble sleeping, tremors since yesterday. Pt does not have hx of anxiety. Pt a.o, nad noted.

## 2018-07-19 ENCOUNTER — Telehealth: Payer: Self-pay

## 2018-07-19 NOTE — ED Provider Notes (Signed)
[facility] EMERGENCY DEPARTMENT Provider Note   CSN: [id number] Arrival date & time: 07/18/18  2252     History   Chief Complaint Chief Complaint  Patient presents with  . Anxiety    HPI [name] [name] is a 48 y.o. male.     Patient with hx of anxiety, used to take klonopin for anxiety, present to the ED with chief complaint of anxiousness.  States that he was feeling anxious and then began to have some tremors.  Has had trouble sleeping.  He has not taken klonopin in years.  He denies any fever, chills, cough, CP, SOB.  He denies any other associated symptoms.  He states that he feels much better now.  The history is provided by the patient. No language interpreter was used.    Past Medical History:  Diagnosis Date  . Anemia    iron def  . B12 deficiency   . 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 Hemorrhoids   . Hypertension   . Prehypertension 03/09/2013  . Pulmonary embolism ([facility]     Patient Active Problem List   Diagnosis Date Noted  . Fatigue 07/05/2018  . Erectile dysfunction 07/05/2018  . Snoring 07/05/2018  . 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 Hypogonadism male 03/12/2013  . Elevated ferritin level 03/12/2013  . Testosterone deficiency 03/09/2013  . Prehypertension 03/09/2013  . Vitamin D deficiency 03/05/2013  . OBESITY 01/21/2009  . VITAMIN B12 DEFICIENCY 04/25/2008  . [place]    Past Surgical History:  Procedure Laterality Date  . COLONOSCOPY    . EXTERNAL FIXATION LEG Left 02/27/2013   Procedure: EXTERNAL FIXATION LEFT KNEE ;  Surgeon: [name] MD;  [facility]  Service: Orthopedics;  Laterality: Left;  . EXTERNAL FIXATION LEG Left 03/01/2013   Procedure: REVISION OF EXTERNAL FIXATOR LEFT LEG;  Surgeon: [name] MD;  [facility]  Service: Orthopedics;  Laterality: Left;  . ileocecal resection  03/2005   . ILEOSTOMY CLOSURE  08/2005  . ileostomy/anastomotic resection for anastomotic leak  03/2005  . KNEE ARTHROSCOPY Left 06/25/2013   Procedure: LYSIS OF ADHESION LEFT KNEE;  Surgeon: [name] MD;  [facility] [facility]  Service: Orthopedics;  Laterality: Left;  . LYSIS OF ADHESION Left 06/25/2013   WITH MANIPULATION     DR HANDY  . ORIF TIBIA FRACTURE Left 03/08/2013   Procedure: LEFT OPEN REDUCTION INTERNAL FIXATION (ORIF) TIBIA FRACTURE;  Surgeon: [name] MD;  [facility] [facility]  Service: Orthopedics;  Laterality: Left;        Home Medications    Prior to Admission medications   Medication Sig Start Date End Date Taking? Authorizing Provider  aspirin EC 81 MG tablet Take 81 mg by mouth daily.    [provider]  cholecalciferol (VITAMIN D) 1000 units tablet Take 1,000 Units by mouth daily.    [provider]  metoprolol succinate (TOPROL-XL) 25 MG 24 hr tablet Take 25 mg by mouth daily.  07/31/14   [provider]  Multiple Vitamin (MULTIVITAMIN WITH MINERALS) TABS tablet Take 1 tablet by mouth daily.    [provider]  pantoprazole (PROTONIX) 40 MG tablet TAKE 1 TABLET (40 MG TOTAL) BY MOUTH DAILY. Patient not taking: Reported on 02/17/2018 02/05/14   SLadene Artist MD    Family History Family History  Problem Relation Age of Onset  .  Breast cancer Mother   . Diabetes Father   . Crohn's disease Other   . Colon cancer Neg Hx     Social History Social History   Tobacco Use  . Smoking status: Never Smoker  . Smokeless tobacco: Never Used  Substance Use Topics  . Alcohol use: No  . Drug use: No     Allergies   Patient has no known allergies.   Review of Systems Review of Systems  All other systems reviewed and are negative.    Physical Exam Updated Vital Signs BP 137/88 (BP Location: Right Arm)   Pulse 83   Temp 98.9 F (37.2 C) (Oral)   Resp 16   SpO2 98%   Physical Exam Vitals signs and nursing note reviewed.   Constitutional:      Appearance: He is well-developed.  HENT:     Head: Normocephalic and atraumatic.  Eyes:     Conjunctiva/sclera: Conjunctivae normal.  Neck:     Musculoskeletal: Neck supple.  Cardiovascular:     Rate and Rhythm: Normal rate and regular rhythm.     Heart sounds: No murmur.  Pulmonary:     Effort: Pulmonary effort is normal. No respiratory distress.     Breath sounds: Normal breath sounds.  Abdominal:     Palpations: Abdomen is soft.     Tenderness: There is no abdominal tenderness.  Skin:    General: Skin is warm and dry.  Neurological:     Mental Status: He is alert and oriented to person, place, and time.  Psychiatric:        Mood and Affect: Mood normal.        Behavior: Behavior normal.        Thought Content: Thought content normal.        Judgment: Judgment normal.      ED Treatments / Results  Labs (all labs ordered are listed, but only abnormal results are displayed) Labs Reviewed - No data to display  EKG None  Radiology No results found.  Procedures Procedures (including critical care time)  Medications Ordered in ED Medications - No data to display   Initial Impression / Assessment and Plan / ED Course  I have reviewed the triage vital signs and the nursing notes.  Pertinent labs & imaging results that were available during my care of the patient were reviewed by me and considered in my medical decision making (see chart for details).        Patient here with anxiousness.  Felt symptoms earlier.  Feels better now.  No CP or SOB.  VSS.  Used to take klonopin for anxiety, but not anymore. Likely anxiety attack.  Will have patient f/u with PCP.  Final Clinical Impressions(s) / ED Diagnoses   Final diagnoses:  Anxiety attack    ED Discharge Orders    None       Montine Circle, PA-C 07/19/18 0029    Palumbo, April, MD 07/19/18 0131

## 2018-07-19 NOTE — Telephone Encounter (Signed)
Patient called requesting a refill on klonopin he stated he has not had it since 2015. Patient had wife call us and notify us that he went to the ER for his anxiety and that he was told he needed to have a f/u with [place]. I have returned pt call and scheduled him for a virtual appointment on 07/21. YRL,RMA

## 2018-07-20 ENCOUNTER — Encounter: Payer: Self-pay | Admitting: Nurse Practitioner

## 2018-07-20 ENCOUNTER — Ambulatory Visit (INDEPENDENT_AMBULATORY_CARE_PROVIDER_SITE_OTHER): Payer: BC Managed Care – PPO | Admitting: Nurse Practitioner

## 2018-07-20 ENCOUNTER — Other Ambulatory Visit: Payer: Self-pay

## 2018-07-20 DIAGNOSIS — F419 Anxiety disorder, unspecified: Secondary | ICD-10-CM

## 2018-07-20 DIAGNOSIS — R251 Tremor, unspecified: Secondary | ICD-10-CM

## 2018-07-20 MED ORDER — CLONAZEPAM 0.5 MG PO TABS: 0.2500 mg | ORAL_TABLET | Freq: Two times a day (BID) | ORAL | 0 refills | Status: DC | PRN

## 2018-07-20 MED ORDER — MAGNESIUM 250 MG PO TABS: ORAL_TABLET | ORAL | 2 refills | Status: DC

## 2018-07-20 NOTE — Patient Instructions (Signed)

## 2018-07-20 NOTE — Progress Notes (Signed)
Virtual Visit via Video   This visit type was conducted due to national recommendations for restrictions regarding the COVID-19 Pandemic (e.g. social distancing) in an effort to limit this patient's exposure and mitigate transmission in our community.  Due to his co-morbid illnesses, this patient is at least at moderate risk for complications without adequate follow up.  This format is felt to be most appropriate for this patient at this time.  All issues noted in this document were discussed and addressed.  A limited physical exam was performed with this format.    This visit type was conducted due to national recommendations for restrictions regarding the COVID-19 Pandemic (e.g. social distancing) in an effort to limit this patient's exposure and mitigate transmission in our community.  Patients identity confirmed using two different identifiers.  This format is felt to be most appropriate for this patient at this time.  All issues noted in this document were discussed and addressed.  No physical exam was performed (except for noted visual exam findings with Video Visits).    Date:  07/20/2018   ID:  [name] [name], DOB 03-06-1970, MRN [id number]  Patient Location:  Home - spoke with [name] [name] and his wife  Provider location:   Office    Chief Complaint:  anxiety  History of Present Illness:    [name] [name] is a 48 y.o. male who presents via video conferencing for a telehealth visit today.    The patient does not have symptoms concerning for COVID-19 infection (fever, chills, cough, or new shortness of breath).   Seen in the ER on 7/14 for symptoms of anxiousness with tremors.  He has taken Klonipin in the past according to our records in 2015/2016.   Usually goes to sleep at 1030 pm.   Has been waking up at 3 am in the last.  No diet change.  No CPAP but a sleep study order has been placed and he is scheduled for visit with neurology in August.   Anxiety Presents for  follow-up visit. Symptoms include insomnia and nervous/anxious behavior. Patient reports no chest pain, dizziness, nausea, palpitations or shortness of breath.        Past Medical History:  Diagnosis Date  . Anemia    iron def  . B12 deficiency   . Crohn's ileocolitis ([place])    65m since 2007  . Hemorrhoids   . Hypertension   . Prehypertension 03/09/2013  . Pulmonary embolism ([facility]    Past Surgical History:  Procedure Laterality Date  . COLONOSCOPY    . EXTERNAL FIXATION LEG Left 02/27/2013   Procedure: EXTERNAL FIXATION LEFT KNEE ;  Surgeon: [name] MD;  [place] [facility]  Service: Orthopedics;  Laterality: Left;  . EXTERNAL FIXATION LEG Left 03/01/2013   Procedure: REVISION OF EXTERNAL FIXATOR LEFT LEG;  Surgeon: [name] MD;  [place] [facility]  Service: Orthopedics;  Laterality: Left;  . ileocecal resection  03/2005  . ILEOSTOMY CLOSURE  08/2005  . ileostomy/anastomotic resection for anastomotic leak  03/2005  . KNEE ARTHROSCOPY Left 06/25/2013   Procedure: LYSIS OF ADHESION LEFT KNEE;  Surgeon: [name] MD;  [place] [facility]  Service: Orthopedics;  Laterality: Left;  . LYSIS OF ADHESION Left 06/25/2013   WITH MANIPULATION     DR HANDY  . ORIF TIBIA FRACTURE Left 03/08/2013   Procedure: LEFT OPEN REDUCTION INTERNAL FIXATION (ORIF) TIBIA FRACTURE;  Surgeon: [name] MD;  [place] [facility]  Service: Orthopedics;  Laterality: Left;     Current Meds  Medication Sig  . aspirin EC 81 MG tablet Take 81 mg by mouth daily.  . Ferrous Sulfate (IRON PO) Take 1 tablet by mouth daily at 12 noon. Take 1 71m tablet daily  . metoprolol succinate (TOPROL-XL) 25 MG 24 hr tablet Take 25 mg by mouth daily.   . Multiple Vitamin (MULTIVITAMIN WITH MINERALS) TABS tablet Take 1 tablet by mouth daily.     Allergies:   Patient has no known allergies.   Social History   Tobacco Use  . Smoking status: Never Smoker  . Smokeless tobacco: Never Used  Substance Use Topics  .  Alcohol use: No  . Drug use: No     Family Hx: The patient's family history includes Breast cancer in his mother; Crohn's disease in an other family member; Diabetes in his father. There is no history of Colon cancer.  ROS:   Please see the history of present illness.    Review of Systems  Constitutional: Negative.  Negative for chills and malaise/fatigue.  Respiratory: Negative.  Negative for shortness of breath.   Cardiovascular: Negative.  Negative for chest pain and palpitations.  Gastrointestinal: Negative for nausea.  Neurological: Negative for dizziness, tingling, tremors and headaches.  Psychiatric/Behavioral: Negative for depression. The patient is nervous/anxious and has insomnia.     All other systems reviewed and are negative.   Labs/Other Tests and Data Reviewed:    Recent Labs: 01/20/2018: BUN 10; Creatinine, Ser 1.31; Potassium 4.5; Sodium 140 07/06/2018: Hemoglobin 14.0; Platelets 292; TSH 1.450   Recent Lipid Panel No results found for: CHOL, TRIG, HDL, CHOLHDL, LDLCALC, LDLDIRECT  Wt Readings from Last 3 Encounters:  07/17/18 252 lb (114.3 kg)  02/17/18 242 lb (109.8 kg)  01/20/18 244 lb 12.8 oz (111 kg)     Exam:    Vital Signs:  There were no vitals taken for this visit.    Physical Exam  Constitutional: He is oriented to person, place, and time. No distress.  Pulmonary/Chest: Effort normal.  Neurological: He is alert and oriented to person, place, and time.  Psychiatric: Mood, memory, affect and judgment normal.    ASSESSMENT & PLAN:    1. Anxiety  Reoccurring anxiety since 2015, recent week he has been awakening in the middle of the night at 3 am with tremors.    Seen in ER for anxiety however was better by the time he was seen  I am not sure if the anxiety is related to possible sleep apnea vs low testosterone vs organic cause.    Will provide limited supply of Klonopin until his appt with sleep study in August  Also encouraged to take  magnesium 2518mwith evening meal  Do not take melatonin and klonopin together.  Follow up in office if not better before sleep study. - Magnesium 250 MG TABS; Take 1 tablet by mouth with evening meal  Dispense: 30 tablet; Refill: 2 - clonazePAM (KLONOPIN) 0.5 MG tablet; Take 0.5 tablets (0.25 mg total) by mouth 2 (two) times daily as needed for anxiety.  Dispense: 30 tablet; Refill: 0   COVID-19 Education: The signs and symptoms of COVID-19 were discussed with the patient and how to seek care for testing (follow up with PCP or arrange E-visit).  The importance of social distancing was discussed today.  Patient Risk:   After full review of this patients clinical status, I feel that they are at least moderate risk at this time.  Time:  Today, I have spent 21 minutes/ seconds with the patient with telehealth technology discussing above diagnoses.     Medication Adjustments/Labs and Tests Ordered: Current medicines are reviewed at length with the patient today.  Concerns regarding medicines are outlined above.   Tests Ordered: No orders of the defined types were placed in this encounter.   Medication Changes: Meds ordered this encounter  Medications  . Magnesium 250 MG TABS    Sig: Take 1 tablet by mouth with evening meal    Dispense:  30 tablet    Refill:  2  . clonazePAM (KLONOPIN) 0.5 MG tablet    Sig: Take 0.5 tablets (0.25 mg total) by mouth 2 (two) times daily as needed for anxiety.    Dispense:  30 tablet    Refill:  0    Disposition:  Follow up prn  Signed, [name], FNP

## 2018-07-25 ENCOUNTER — Ambulatory Visit: Payer: Self-pay | Admitting: Nurse Practitioner

## 2018-07-28 ENCOUNTER — Encounter: Payer: Self-pay | Admitting: Nurse Practitioner

## 2018-07-31 ENCOUNTER — Other Ambulatory Visit: Payer: Self-pay

## 2018-08-01 ENCOUNTER — Ambulatory Visit: Payer: BC Managed Care – PPO | Admitting: Nurse Practitioner

## 2018-08-01 ENCOUNTER — Emergency Department (HOSPITAL_COMMUNITY)
Admission: EM | Admit: 2018-08-01 | Discharge: 2018-08-01 | Discharge status: Against Medical Advice | Payer: BC Managed Care – PPO | Attending: Emergency Medicine | Admitting: Emergency Medicine

## 2018-08-01 ENCOUNTER — Encounter (HOSPITAL_COMMUNITY): Payer: Self-pay | Admitting: Emergency Medicine

## 2018-08-01 ENCOUNTER — Encounter: Payer: Self-pay | Admitting: Nurse Practitioner

## 2018-08-01 VITALS — BP 118/80 | HR 90 | Temp 98.2°F | Ht 69.0 in | Wt 252.8 lb

## 2018-08-01 DIAGNOSIS — F419 Anxiety disorder, unspecified: Secondary | ICD-10-CM

## 2018-08-01 DIAGNOSIS — R7309 Other abnormal glucose: Secondary | ICD-10-CM | POA: Diagnosis not present

## 2018-08-01 DIAGNOSIS — Z0000 Encounter for general adult medical examination without abnormal findings: Secondary | ICD-10-CM | POA: Diagnosis not present

## 2018-08-01 DIAGNOSIS — Z125 Encounter for screening for malignant neoplasm of prostate: Secondary | ICD-10-CM | POA: Diagnosis not present

## 2018-08-01 DIAGNOSIS — Z5321 Procedure and treatment not carried out due to patient leaving prior to being seen by health care provider: Secondary | ICD-10-CM | POA: Diagnosis not present

## 2018-08-01 DIAGNOSIS — Z1211 Encounter for screening for malignant neoplasm of colon: Secondary | ICD-10-CM | POA: Diagnosis not present

## 2018-08-01 HISTORY — DX: Anxiety disorder, unspecified: F41.9

## 2018-08-01 LAB — POCT URINALYSIS DIPSTICK
Bilirubin, UA: NEGATIVE
Blood, UA: NEGATIVE
Glucose, UA: NEGATIVE
Ketones, UA: NEGATIVE
Leukocytes, UA: NEGATIVE
Nitrite, UA: NEGATIVE
Protein, UA: NEGATIVE
Spec Grav, UA: 1.025 (ref 1.010–1.025)
Urobilinogen, UA: 0.2 E.U./dL
pH, UA: 6 (ref 5.0–8.0)

## 2018-08-01 MED ORDER — BUSPIRONE HCL 10 MG PO TABS: 10.0000 mg | ORAL_TABLET | Freq: Every day | ORAL | 2 refills | Status: DC

## 2018-08-01 MED ORDER — PANTOPRAZOLE SODIUM 40 MG PO TBEC: 40.0000 mg | DELAYED_RELEASE_TABLET | Freq: Every day | ORAL | 0 refills | Status: DC

## 2018-08-01 NOTE — ED Triage Notes (Signed)
Feeling anxious after sleeping and awaking up . Took Klonopin at home around 0050. Still feeling anxious. PT cooperative.

## 2018-08-01 NOTE — Patient Instructions (Addendum)
Call [facility] for an appointment a referral has been placed.  HAFBX:[phone]  Health Maintenance  Topic Date Due  . INFLUENZA VACCINE  08/05/2018  . TETANUS/TDAP  07/10/2025  . HIV Screening  Completed    Health Maintenance, Male Adopting a healthy lifestyle and getting preventive care are important in promoting health and wellness. Ask your health care provider about:  The right schedule for you to have regular tests and exams.  Things you can do on your own to prevent diseases and keep yourself healthy. What should I know about diet, weight, and exercise? Eat a healthy diet   Eat a diet that includes plenty of vegetables, fruits, low-fat dairy products, and lean protein.  Do not eat a lot of foods that are high in solid fats, added sugars, or sodium. Maintain a healthy weight Body mass index (BMI) is a measurement that can be used to identify possible weight problems. It estimates body fat based on height and weight. Your health care provider can help determine your BMI and help you achieve or maintain a healthy weight. Get regular exercise Get regular exercise. This is one of the most important things you can do for your health. Most adults should:  Exercise for at least 150 minutes each week. The exercise should increase your heart rate and make you sweat (moderate-intensity exercise).  Do strengthening exercises at least twice a week. This is in addition to the moderate-intensity exercise.  Spend less time sitting. Even light physical activity can be beneficial. Watch cholesterol and blood lipids Have your blood tested for lipids and cholesterol at 47 years of age, then have this test every 5 years. You may need to have your cholesterol levels checked more often if:  Your lipid or cholesterol levels are high.  You are older than 47 years of age.  You are at high risk for heart disease. What should I know about cancer screening? Many types of cancers can be  detected early and may often be prevented. Depending on your health history and family history, you may need to have cancer screening at various ages. This may include screening for:  Colorectal cancer.  Prostate cancer.  Skin cancer.  Lung cancer. What should I know about heart disease, diabetes, and high blood pressure? Blood pressure and heart disease  High blood pressure causes heart disease and increases the risk of stroke. This is more likely to develop in people who have high blood pressure readings, are of African descent, or are overweight.  Talk with your health care provider about your target blood pressure readings.  Have your blood pressure checked: ? Every 3-5 years if you are 42-93 years of age. ? Every year if you are 81 years old or older.  If you are between the ages of 7 and 65 and are a current or former smoker, ask your health care provider if you should have a one-time screening for abdominal aortic aneurysm (AAA). Diabetes Have regular diabetes screenings. This checks your fasting blood sugar level. Have the screening done:  Once every three years after age 23 if you are at a normal weight and have a low risk for diabetes.  More often and at a younger age if you are overweight or have a high risk for diabetes. What should I know about preventing infection? Hepatitis B If you have a higher risk for hepatitis B, you should be screened for this virus. Talk with your health care provider to find out if you are  at risk for hepatitis B infection. Hepatitis C Blood testing is recommended for:  Everyone born from [birth year] through 1964.  Anyone with known risk factors for hepatitis C. Sexually transmitted infections (STIs)  You should be screened each year for STIs, including gonorrhea and chlamydia, if: ? You are sexually active and are younger than 47 years of age. ? You are older than 47 years of age and your health care provider tells you that you are at risk  for this type of infection. ? Your sexual activity has changed since you were last screened, and you are at increased risk for chlamydia or gonorrhea. Ask your health care provider if you are at risk.  Ask your health care provider about whether you are at high risk for HIV. Your health care provider may recommend a prescription medicine to help prevent HIV infection. If you choose to take medicine to prevent HIV, you should first get tested for HIV. You should then be tested every 3 months for as long as you are taking the medicine. Follow these instructions at home: Lifestyle  Do not use any products that contain nicotine or tobacco, such as cigarettes, e-cigarettes, and chewing tobacco. If you need help quitting, ask your health care provider.  Do not use street drugs.  Do not share needles.  Ask your health care provider for help if you need support or information about quitting drugs. Alcohol use  Do not drink alcohol if your health care provider tells you not to drink.  If you drink alcohol: ? Limit how much you have to 0-2 drinks a day. ? Be aware of how much alcohol is in your drink. In the [place] one drink equals one 12 oz bottle of beer (355 mL), one 5 oz glass of wine (148 mL), or one 1 oz glass of hard liquor (44 mL). General instructions  Schedule regular health, dental, and eye exams.  Stay current with your vaccines.  Tell your health care provider if: ? You often feel depressed. ? You have ever been abused or do not feel safe at home. Summary  Adopting a healthy lifestyle and getting preventive care are important in promoting health and wellness.  Follow your health care provider's instructions about healthy diet, exercising, and getting tested or screened for diseases.  Follow your health care provider's instructions on monitoring your cholesterol and blood pressure. This information is not intended to replace advice given to you by your health care provider.  Make sure you discuss any questions you have with your health care provider. Document Released: 06/19/2007 Document Revised: 12/14/2017 Document Reviewed: 12/14/2017 Elsevier Patient Education  2020 [employer].

## 2018-08-01 NOTE — ED Notes (Signed)
Wife Princella Ion [phone]

## 2018-08-01 NOTE — Progress Notes (Signed)
Subjective:     Patient ID: [name] [name] , male    DOB: 01-06-1970 , 48 y.o.   MRN: [id number]   Chief Complaint  Patient presents with  . Annual Exam   HPI Men's preventive visit. Patient Health Questionnaire (PHQ-2) is    Office Visit from 08/01/2018 in [facility]  PHQ-2 Total Score  0     Patient is on a regular diet. Marital status: Married. Relevant history for alcohol use is:  Social History   Substance and Sexual Activity  Alcohol Use No   Relevant history for tobacco use is:  Social History   Tobacco Use  Smoking Status Never Smoker  Smokeless Tobacco Never Used   Here for HM  He is scheduled for a sleep study on August 7th.    No recent flares of Crohn's  He went to the ER this morning upon awakening at 2 am feeling anxious but he was not seen by a provider.   Anxiety Presents for follow-up visit. Symptoms include nervous/anxious behavior. Patient reports no chest pain or palpitations. Episode frequency: nightly. The severity of symptoms is moderate. The quality of sleep is fair.       Past Medical History:  Diagnosis Date  . Anemia    iron def  . Anxiety   . B12 deficiency   . 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 Hemorrhoids   . Hypertension   . Prehypertension 03/09/2013  . Pulmonary embolism ([facility])      Family History  Problem Relation Age of Onset  . Breast cancer Mother   . Diabetes Father   . Crohn's disease Other   . Colon cancer Neg Hx      Current Outpatient Medications:  .  aspirin EC 81 MG tablet, Take 81 mg by mouth daily., Disp: , Rfl:  .  clonazePAM (KLONOPIN) 0.5 MG tablet, Take 0.5 tablets (0.25 mg total) by mouth 2 (two) times daily as needed for anxiety., Disp: 30 tablet, Rfl: 0 .  Ferrous Sulfate (IRON PO), Take 1 tablet by mouth daily at 12 noon. Take 1 660mtablet daily, Disp: , Rfl:  .  Magnesium 250 MG TABS, Take 1 tablet by mouth with evening meal, Disp: 30 tablet, Rfl: 2 .   metoprolol succinate (TOPROL-XL) 25 MG 24 hr tablet, Take 25 mg by mouth daily. , Disp: , Rfl: 2 .  Multiple Vitamin (MULTIVITAMIN WITH MINERALS) TABS tablet, Take 1 tablet by mouth daily., Disp: , Rfl:    No Known Allergies   Review of Systems  Constitutional: Negative.   HENT: Negative.   Eyes: Negative.   Respiratory: Negative.   Cardiovascular: Negative.  Negative for chest pain and palpitations.  Gastrointestinal: Negative.   Endocrine: Negative.   Genitourinary: Negative.   Skin: Negative.   Allergic/Immunologic: Negative.   Neurological: Negative.   Hematological: Negative.   Psychiatric/Behavioral: Negative for agitation. The patient is nervous/anxious.      Today's Vitals   08/01/18 0910  BP: 118/80  Pulse: 90  Temp: 98.2 F (36.8 C)  TempSrc: Oral  Weight: 252 lb 12.8 oz (114.7 kg)  Height: 5' 9"  (1.753 m)  PainSc: 0-No pain   Body mass index is 37.33 kg/m.   Objective:  Physical Exam Vitals signs reviewed.  Constitutional:      General: He is not in acute distress.    Appearance: Normal appearance. He is obese.  HENT:     Head: Normocephalic and atraumatic.  Right Ear: Tympanic membrane, ear canal and external ear normal. There is no impacted cerumen.     Left Ear: Tympanic membrane, ear canal and external ear normal. There is no impacted cerumen.  Neck:     Musculoskeletal: Normal range of motion and neck supple.  Cardiovascular:     Rate and Rhythm: Normal rate and regular rhythm.     Pulses: Normal pulses.     Heart sounds: Normal heart sounds. No murmur.  Pulmonary:     Effort: Pulmonary effort is normal. No respiratory distress.     Breath sounds: Normal breath sounds.  Abdominal:     General: Abdomen is flat. Bowel sounds are normal. There is no distension.     Palpations: Abdomen is soft.  Genitourinary:    Prostate: Normal.     Rectum: Guaiac result negative.  Musculoskeletal: Normal range of motion.  Skin:    General: Skin is warm.      Capillary Refill: Capillary refill takes less than 2 seconds.  Neurological:     General: No focal deficit present.     Mental Status: He is alert and oriented to person, place, and time.  Psychiatric:        Mood and Affect: Mood normal.        Behavior: Behavior normal.        Thought Content: Thought content normal.        Judgment: Judgment normal.         Assessment And Plan:     1. Encounter for general adult medical examination w/o abnormal findings . Behavior modifications discussed and diet history reviewed.   . Pt will continue to exercise regularly and modify diet with low GI, plant based foods and decrease intake of processed foods.  . Recommend intake of daily multivitamin, Vitamin D, and calcium.  . Recommend for preventive screenings, as well as recommend immunizations that includeTDAP - POCT Urinalysis Dipstick (81002) - Vitamin D (25 hydroxy) - Lipid Profile  2. Anxiety  He is having multiple episodes and will start him on buspar daily to see if this helps the sudden onset  Offered counseling however he declines at this time  He is advised to avoid drinking high sugary and caffeinated drinks at bedtime - busPIRone (BUSPAR) 10 MG tablet; Take 1 tablet (10 mg total) by mouth at bedtime.  Dispense: 30 tablet; Refill: 2  3. Abnormal glucose  Chronic, controlled  no current medications  Encouraged to limit intake of sugary foods and drinks  Encouraged to increase physical activity to 150 minutes per week - BMP8+Anion Gap - Hemoglobin A1c  4. Encounter for prostate cancer screening  - PSA       [name], FNP    THE PATIENT IS ENCOURAGED TO PRACTICE SOCIAL DISTANCING DUE TO THE COVID-19 PANDEMIC.

## 2018-08-02 ENCOUNTER — Other Ambulatory Visit: Payer: Self-pay | Admitting: Nurse Practitioner

## 2018-08-02 DIAGNOSIS — E559 Vitamin D deficiency, unspecified: Secondary | ICD-10-CM

## 2018-08-02 LAB — LIPID PANEL
Chol/HDL Ratio: 3.5 ratio (ref 0.0–5.0)
Cholesterol, Total: 167 mg/dL (ref 100–199)
HDL: 48 mg/dL (ref >39)
LDL Calculated: 106 mg/dL — ABNORMAL HIGH (ref 0–99)
Triglycerides: 67 mg/dL (ref 0–149)
VLDL Cholesterol Cal: 13 mg/dL (ref 5–40)

## 2018-08-02 LAB — HEMOGLOBIN A1C
Est. average glucose Bld gHb Est-mCnc: 120 mg/dL
Hgb A1c MFr Bld: 5.8 % — ABNORMAL HIGH (ref 4.8–5.6)

## 2018-08-02 LAB — VITAMIN D 25 HYDROXY (VIT D DEFICIENCY, FRACTURES): Vit D, 25-Hydroxy: 23.6 ng/mL — ABNORMAL LOW (ref 30.0–100.0)

## 2018-08-02 LAB — BMP8+ANION GAP
Anion Gap: 13 mmol/L (ref 10.0–18.0)
BUN/Creatinine Ratio: 9 (ref 9–20)
BUN: 10 mg/dL (ref 6–24)
CO2: 23 mmol/L (ref 20–29)
Calcium: 9.5 mg/dL (ref 8.7–10.2)
Chloride: 103 mmol/L (ref 96–106)
Creatinine, Ser: 1.07 mg/dL (ref 0.76–1.27)
GFR calc Af Amer: 95 mL/min/{1.73_m2} (ref >59)
GFR calc non Af Amer: 82 mL/min/{1.73_m2} (ref >59)
Glucose: 91 mg/dL (ref 65–99)
Potassium: 3.7 mmol/L (ref 3.5–5.2)
Sodium: 139 mmol/L (ref 134–144)

## 2018-08-02 LAB — PSA: Prostate Specific Ag, Serum: 2.2 ng/mL (ref 0.0–4.0)

## 2018-08-02 MED ORDER — VITAMIN D (ERGOCALCIFEROL) 1.25 MG (50000 UNIT) PO CAPS: 50000.0000 [IU] | ORAL_CAPSULE | ORAL | 0 refills | Status: DC

## 2018-08-11 ENCOUNTER — Ambulatory Visit (INDEPENDENT_AMBULATORY_CARE_PROVIDER_SITE_OTHER): Payer: BC Managed Care – PPO | Admitting: Neurology

## 2018-08-11 ENCOUNTER — Other Ambulatory Visit: Payer: Self-pay

## 2018-08-11 DIAGNOSIS — R5383 Other fatigue: Secondary | ICD-10-CM

## 2018-08-11 DIAGNOSIS — R0683 Snoring: Secondary | ICD-10-CM

## 2018-08-11 DIAGNOSIS — G4710 Hypersomnia, unspecified: Secondary | ICD-10-CM

## 2018-08-11 DIAGNOSIS — R351 Nocturia: Secondary | ICD-10-CM

## 2018-08-11 DIAGNOSIS — E669 Obesity, unspecified: Secondary | ICD-10-CM

## 2018-08-11 DIAGNOSIS — Z820 Family history of epilepsy and other diseases of the nervous system: Secondary | ICD-10-CM

## 2018-08-11 DIAGNOSIS — G4720 Circadian rhythm sleep disorder, unspecified type: Secondary | ICD-10-CM

## 2018-08-15 ENCOUNTER — Other Ambulatory Visit: Payer: Self-pay | Admitting: Nurse Practitioner

## 2018-08-15 ENCOUNTER — Telehealth: Payer: Self-pay

## 2018-08-15 DIAGNOSIS — F419 Anxiety disorder, unspecified: Secondary | ICD-10-CM

## 2018-08-15 MED ORDER — METOPROLOL SUCCINATE ER 25 MG PO TB24: 25.0000 mg | ORAL_TABLET | Freq: Every day | ORAL | 2 refills | Status: DC

## 2018-08-15 NOTE — Telephone Encounter (Signed)
Patient called stating he is no longer taking the buspar because it is causing him to have diarrhea so he is now just taking the klonopin and it is helping with his anxiety and is able to sleep. He also wants a refill on metoprolol.    I HAVE RETURNED PT CALL AND NOTIFIED HIM THAT WE WILL BE REFERRING HIM TO PSYCHIATRY AND I HAVE ALSO REFILLED HIS MED. YRL,RMA

## 2018-08-15 NOTE — Telephone Encounter (Signed)
Clonazepam refill

## 2018-08-17 NOTE — Procedures (Signed)
PATIENT'S NAME:  [name] [name], [name] [name]  DOB:      Sep 29, 1970      MR#:    [id number]     DATE OF RECORDING: 08/11/2018 REFERRING M.D.:  [name] FNP Study Performed:   Baseline Polysomnogram HISTORY: 48 year old man with a history of B12 deficiency, Crohn's disease, hypertension, history of pulmonary embolism, iron deficiency anemia, and obesity, who reports snoring and excessive daytime somnolence. The patient endorsed the Epworth Sleepiness Scale at 6 points. The patient's weight 252 pounds with a height of 69 (inches), resulting in a BMI of 37.2 kg/m2. The patient's neck circumference measured 14.25 inches.  CURRENT MEDICATIONS: ASA 56m, Vitamin D, Toprol-XL, Multivitamin, Protonix   PROCEDURE:  This is a multichannel digital polysomnogram utilizing the Somnostar 11.2 system.  Electrodes and sensors were applied and monitored per AASM Specifications.   EEG, EOG, Chin and Limb EMG, were sampled at 200 Hz.  ECG, Snore and Nasal Pressure, Thermal Airflow, Respiratory Effort, CPAP Flow and Pressure, Oximetry was sampled at 50 Hz. Digital video and audio were recorded.      BASELINE STUDY  Lights Out was at 22:03 and Lights On at 05:00.  Total recording time (TRT) was 418 minutes, with a total sleep time (TST) of 328.5 minutes.   The patient's sleep latency was 22 minutes. REM latency was 250.5 minutes, which is delayed. The sleep efficiency was 78.6 %.     SLEEP ARCHITECTURE: WASO (Wake after sleep onset) was 78 minutes with moderate sleep fragmentation noted. There were 71 minutes in Stage N1, 214 minutes Stage N2, 25 minutes Stage N3 and 18.5 minutes in Stage REM.  The percentage of Stage N1 was 21.6%, which is increased, Stage N2 was 65.1%, which is is increased, Stage N3 was 7.6% and Stage R (REM sleep) was 5.6%, which is markedly reduced.   RESPIRATORY ANALYSIS:  There were a total of 11 respiratory events:  0 obstructive apneas, 0 central apneas and 0 mixed apneas with a total of 0 apneas and an  apnea index (AI) of 0 /hour. There were 11 hypopneas with a hypopnea index of 2. /hour. The patient also had 0 respiratory event related arousals (RERAs).      The total APNEA/HYPOPNEA INDEX (AHI) was 2. /hour and the total RESPIRATORY DISTURBANCE INDEX was 0. 2. /hour.  1 events occurred in REM sleep and 20 events in NREM. The REM AHI was 3.2 /hour, versus a non-REM AHI of 1.9. The patient spent 217.5 minutes of total sleep time in the supine position and 111 minutes in non-supine.. The supine AHI was 2.2 versus a non-supine AHI of 1.6.  OXYGEN SATURATION & C02:  The Wake baseline 02 saturation was 98%, with the lowest being 86%. Time spent below 89% saturation equaled 0 minutes.  PERIODIC LIMB MOVEMENTS: The patient had a total of 0 Periodic Limb Movements.  The Periodic Limb Movement (PLM) index was 0 and the PLM Arousal index was 0/hour. The arousals were noted as: 46 were spontaneous, 0 were associated with PLMs, 9 were associated with respiratory events.  Audio and video analysis did not show any abnormal or unusual movements, behaviors, phonations or vocalizations. The patient took 1 bathroom break. Moderate to loud snoring was noted. The EKG was in keeping with normal sinus rhythm (NSR).  Post-study, the patient indicated that sleep was the same as usual.   IMPRESSION:  1. Primary Snoring 2. Dysfunctions associated with sleep stages or arousal from sleep  RECOMMENDATIONS:  1. This study does not  demonstrate any significant obstructive or central sleep disordered breathing with the exception of snoring, which was in the moderate to loud range. Treatment with positive airway pressure is not warranted; weight loss and avoidance of the supine sleep position will likely reduce the snoring. For disturbing snoring, an oral appliance (through a qualified dentist) can be considered.  2. This study shows sleep fragmentation and abnormal sleep stage percentages; these are nonspecific findings and  per se do not signify an intrinsic sleep disorder or a cause for the patient's sleep-related symptoms. Causes include (but are not limited to) the first night effect of the sleep study, circadian rhythm disturbances, medication effect or an underlying mood disorder or medical problem.  3. The patient should be cautioned not to drive, work at heights, or operate dangerous or heavy equipment when tired or sleepy. Review and reiteration of good sleep hygiene measures should be pursued with any patient. 4. The patient will be advised to follow up with the referring provider, who will be notified of the test results.  I certify that I have reviewed the entire raw data recording prior to the issuance of this report in accordance with the Standards of Accreditation of the American Academy of Sleep Medicine (AASM)  Star Age, MD, PhD Diplomat, American Board of Neurology and Sleep Medicine (Neurology and Sleep Medicine)

## 2018-08-17 NOTE — Progress Notes (Signed)
Patient referred by [name], NP, seen by me on 07/17/18, diagnostic PSG on 08/11/18.   Please call and notify the patient that the recent sleep study did not show any significant obstructive sleep apnea with the exception of snoring, which was in the moderate to loud range. Treatment with positive airway pressure is not warranted; weight loss and avoidance of the supine sleep position will likely reduce the snoring. For disturbing snoring, an oral appliance (through a qualified dentist) can be considered.  He can FU with PCP.   Thanks,  Star Age, MD, PhD [facility])

## 2018-08-22 ENCOUNTER — Telehealth: Payer: Self-pay

## 2018-08-22 NOTE — Telephone Encounter (Signed)
I called pt to discuss. No answer, left a message asking him to call me back. 

## 2018-08-22 NOTE — Telephone Encounter (Signed)
-----   Message from Star Age, MD sent at 08/17/2018  5:07 PM EDT ----- Patient referred by [name], NP, seen by me on 07/17/18, diagnostic PSG on 08/11/18.   Please call and notify the patient that the recent sleep study did not show any significant obstructive sleep apnea with the exception of snoring, which was in the moderate to loud range. Treatment with positive airway pressure is not warranted; weight loss and avoidance of the supine sleep position will likely reduce the snoring. For disturbing snoring, an oral appliance (through a qualified dentist) can be considered.  He can FU with PCP.   Thanks,  Star Age, MD, PhD [facility])

## 2018-08-23 ENCOUNTER — Other Ambulatory Visit: Payer: Self-pay | Admitting: Nurse Practitioner

## 2018-08-23 DIAGNOSIS — F419 Anxiety disorder, unspecified: Secondary | ICD-10-CM

## 2018-08-24 NOTE — Telephone Encounter (Signed)
I called pt. I discussed his sleep study results and recommendations. Pt will follow up with his PCP. Pt verbalized understanding of results. Pt had no questions at this time but was encouraged to call back if questions arise.

## 2018-09-05 ENCOUNTER — Ambulatory Visit (INDEPENDENT_AMBULATORY_CARE_PROVIDER_SITE_OTHER): Payer: BC Managed Care – PPO | Admitting: Nurse Practitioner

## 2018-09-05 ENCOUNTER — Other Ambulatory Visit: Payer: Self-pay

## 2018-09-05 ENCOUNTER — Encounter: Payer: Self-pay | Admitting: Nurse Practitioner

## 2018-09-05 VITALS — BP 120/82 | HR 92 | Temp 98.4°F | Ht 69.0 in | Wt 247.6 lb

## 2018-09-05 DIAGNOSIS — Z23 Encounter for immunization: Secondary | ICD-10-CM | POA: Diagnosis not present

## 2018-09-05 DIAGNOSIS — F419 Anxiety disorder, unspecified: Secondary | ICD-10-CM | POA: Diagnosis not present

## 2018-09-05 DIAGNOSIS — R0683 Snoring: Secondary | ICD-10-CM | POA: Diagnosis not present

## 2018-09-05 DIAGNOSIS — N529 Male erectile dysfunction, unspecified: Secondary | ICD-10-CM | POA: Diagnosis not present

## 2018-09-05 NOTE — Progress Notes (Signed)
Subjective:     Patient ID: [name] [name] , male    DOB: 01-17-1970 , 48 y.o.   MRN: [id number]   Chief Complaint  Patient presents with  . Anxiety    patient presents today for a 4-6 week med check. patient was put on buspar    HPI  He stopped taking buspirone due to causing him having to go to the bathroom more at night.    Anxiety Presents for follow-up visit. Symptoms include nervous/anxious behavior. Patient reports no decreased concentration or dizziness.       Past Medical History:  Diagnosis Date  . Anemia    iron def  . Anxiety   . B12 deficiency   . 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 Hemorrhoids   . Hypertension   . Prehypertension 03/09/2013  . Pulmonary embolism ([facility])      Family History  Problem Relation Age of Onset  . Breast cancer Mother   . Diabetes Father   . Crohn's disease Other   . Colon cancer Neg Hx      Current Outpatient Medications:  .  aspirin EC 81 MG tablet, Take 81 mg by mouth daily., Disp: , Rfl:  .  clonazePAM (KLONOPIN) 0.5 MG tablet, TAKE 0.5 TABLETS (0.25 MG TOTAL) BY MOUTH 2 (TWO) TIMES DAILY AS NEEDED FOR ANXIETY., Disp: 30 tablet, Rfl: 0 .  Ferrous Sulfate (IRON PO), Take 1 tablet by mouth daily at 12 noon. Take 1 625mtablet daily, Disp: , Rfl:  .  metoprolol succinate (TOPROL-XL) 25 MG 24 hr tablet, Take 1 tablet (25 mg total) by mouth daily., Disp: 30 tablet, Rfl: 2 .  Multiple Vitamin (MULTIVITAMIN WITH MINERALS) TABS tablet, Take 1 tablet by mouth daily., Disp: , Rfl:  .  pantoprazole (PROTONIX) 40 MG tablet, Take 1 tablet (40 mg total) by mouth daily., Disp: 90 tablet, Rfl: 0 .  Vitamin D, Ergocalciferol, (DRISDOL) 1.25 MG (50000 UT) CAPS capsule, Take 1 capsule (50,000 Units total) by mouth every 7 (seven) days., Disp: 12 capsule, Rfl: 0 .  busPIRone (BUSPAR) 10 MG tablet, TAKE 1 TABLET BY MOUTH EVERYDAY AT BEDTIME (Patient not taking: Reported on 09/05/2018), Disp: 90 tablet, Rfl: 1   No Known Allergies    Review of Systems  Constitutional: Negative.   Respiratory: Negative.   Cardiovascular: Negative.   Neurological: Negative.  Negative for dizziness and headaches.  Psychiatric/Behavioral: Negative for agitation and decreased concentration. The patient is nervous/anxious.      Today's Vitals   09/05/18 0836  BP: 120/82  Pulse: 92  Temp: 98.4 F (36.9 C)  TempSrc: Oral  Weight: 247 lb 9.6 oz (112.3 kg)  Height: 5' 9"  (1.753 m)  PainSc: 0-No pain   Body mass index is 36.56 kg/m.   Objective:  Physical Exam Constitutional:      Appearance: Normal appearance.  Cardiovascular:     Rate and Rhythm: Normal rate and regular rhythm.     Pulses: Normal pulses.     Heart sounds: Normal heart sounds. No murmur.  Pulmonary:     Effort: Pulmonary effort is normal.     Breath sounds: Normal breath sounds.  Skin:    General: Skin is warm and dry.     Capillary Refill: Capillary refill takes less than 2 seconds.  Neurological:     General: No focal deficit present.     Mental Status: He is alert and oriented to person, place, and time.  Assessment And Plan:    1. Anxiety  He stopped taking buspirone he felt was causing him nocturia.    He is considering trying again however will let me know.    2. Need for influenza vaccination  Influenza vaccine given in office  Advised to take Tylenol as needed for muscle aches or fever - Flu Vaccine QUAD 6+ mos PF IM (Fluarix Quad PF)  3. Snoring  Sleep study done by Dr. Rexene Alberts, does not have sleep apnea,  Recommended to lose weight  4. Erectile dysfunction, unspecified erectile dysfunction type  He has been seen by urology, no abnormal findings at this time  He was given a medication but he is unsure of the name will call back with the name.     [name], FNP    THE PATIENT IS ENCOURAGED TO PRACTICE SOCIAL DISTANCING DUE TO THE COVID-19 PANDEMIC.

## 2018-09-05 NOTE — Patient Instructions (Signed)
Influenza Virus Vaccine (Flucelvax) What is this medicine? INFLUENZA VIRUS VACCINE (in floo EN zuh VAHY ruhs vak SEEN) helps to reduce the risk of getting influenza also known as the flu. The vaccine only helps protect you against some strains of the flu. This medicine may be used for other purposes; ask your health care provider or pharmacist if you have questions. COMMON BRAND NAME(S): FLUCELVAX What should I tell my health care provider before I take this medicine? They need to know if you have any of these conditions:  bleeding disorder like hemophilia  fever or infection  Guillain-Barre syndrome or other neurological problems  immune system problems  infection with the human immunodeficiency virus (HIV) or AIDS  low blood platelet counts  multiple sclerosis  an unusual or allergic reaction to influenza virus vaccine, other medicines, foods, dyes or preservatives  pregnant or trying to get pregnant  breast-feeding How should I use this medicine? This vaccine is for injection into a muscle. It is given by a health care professional. A copy of Vaccine Information Statements will be given before each vaccination. Read this sheet carefully each time. The sheet may change frequently. Talk to your pediatrician regarding the use of this medicine in children. Special care may be needed. Overdosage: If you think you've taken too much of this medicine contact a poison control center or emergency room at once. Overdosage: If you think you have taken too much of this medicine contact a poison control center or emergency room at once. NOTE: This medicine is only for you. Do not share this medicine with others. What if I miss a dose? This does not apply. What may interact with this medicine?  chemotherapy or radiation therapy  medicines that lower your immune system like etanercept, anakinra, infliximab, and adalimumab  medicines that treat or prevent blood clots like  warfarin  phenytoin  steroid medicines like prednisone or cortisone  theophylline  vaccines This list may not describe all possible interactions. Give your health care provider a list of all the medicines, herbs, non-prescription drugs, or dietary supplements you use. Also tell them if you smoke, drink alcohol, or use illegal drugs. Some items may interact with your medicine. What should I watch for while using this medicine? Report any side effects that do not go away within 3 days to your doctor or health care professional. Call your health care provider if any unusual symptoms occur within 6 weeks of receiving this vaccine. You may still catch the flu, but the illness is not usually as bad. You cannot get the flu from the vaccine. The vaccine will not protect against colds or other illnesses that may cause fever. The vaccine is needed every year. What side effects may I notice from receiving this medicine? Side effects that you should report to your doctor or health care professional as soon as possible:  allergic reactions like skin rash, itching or hives, swelling of the face, lips, or tongue Side effects that usually do not require medical attention (Report these to your doctor or health care professional if they continue or are bothersome.):  fever  headache  muscle aches and pains  pain, tenderness, redness, or swelling at the injection site  tiredness This list may not describe all possible side effects. Call your doctor for medical advice about side effects. You may report side effects to FDA at 1-800-FDA-1088. Where should I keep my medicine? The vaccine will be given by a health care professional in a clinic, pharmacy, doctor's   office, or other health care setting. You will not be given vaccine doses to store at home. NOTE: This sheet is a summary. It may not cover all possible information. If you have questions about this medicine, talk to your doctor, pharmacist, or  health care provider.  2020 Elsevier/Gold Standard (2010-12-02 14:06:47)  

## 2018-09-20 ENCOUNTER — Telehealth: Payer: Self-pay

## 2018-09-20 NOTE — Telephone Encounter (Signed)
[place] and he was wondering about the klonopin or the buspar. He wants to know which medicine is better for him to take.   I RETURNED PT CALL AND ADVISED HIM THAT HE SHOULD TAKE THE BUSPAR DAILY AND TAKE THE KLONOPIN PRN. YRL,RMA

## 2018-10-18 ENCOUNTER — Other Ambulatory Visit: Payer: Self-pay | Admitting: Nurse Practitioner

## 2018-10-18 ENCOUNTER — Ambulatory Visit: Payer: BC Managed Care – PPO | Admitting: Gastroenterology

## 2018-10-18 ENCOUNTER — Encounter: Payer: Self-pay | Admitting: Gastroenterology

## 2018-10-18 VITALS — BP 120/76 | HR 76 | Temp 97.9°F | Ht 69.0 in | Wt 246.0 lb

## 2018-10-18 DIAGNOSIS — E559 Vitamin D deficiency, unspecified: Secondary | ICD-10-CM

## 2018-10-18 DIAGNOSIS — K6289 Other specified diseases of anus and rectum: Secondary | ICD-10-CM

## 2018-10-18 DIAGNOSIS — K625 Hemorrhage of anus and rectum: Secondary | ICD-10-CM

## 2018-10-18 NOTE — Patient Instructions (Signed)
Start over the counter Lotrimin AF cream 1% three times a day on rectal area x 10 days.  Stop using all other creams on rectal area.  If you do not see improvement in your symptoms then please call our office back to schedule Colonoscopy.   RECTAL CARE INSTRUCTIONS:  1. Sitz Baths twice a day for 10 minutes each. 2. Thoroughly clean and dry the rectum. 3. Put Tucks pad against the rectum at night. 4. Clean the rectum with Balenol lotion after each bowel movement.  Thank you for choosing me and [facility].  Pricilla Riffle. Dagoberto Ligas., MD., Mar[facility] Regal

## 2018-10-18 NOTE — Progress Notes (Signed)
    History of Present Illness: This is a 48 year old male with a history of Crohn's ileocolitis status post ileocecectomy.  He has been asymptomatic for years and is no longer on IBD medications.  He has concerns about how Covid-19 might impact him.  He also notes frequent rectal discomfort, small amounts of rectal bleeding when wiping and a mild perianal rash that has come and gone for the past few months.  He has used topical over-the-counter hemorrhoidal treatments with improvement in symptoms.  Over the past couple years he has noted intermittent problems with mild intermittent constipation, intermittent small pellet-like stools, intermittent looser stools.  The pattern is irregular and has not progressed in frequency or severity over the past year or so.  Denies weight loss, abdominal pain, change in stool caliber, melena, nausea, vomiting, dysphagia, reflux symptoms, chest pain.  Current Medications, Allergies, Past Medical History, Past Surgical History, Family History and Social History were reviewed in [employer] record.   Physical Exam: General: Well developed, well nourished, no acute distress Head: Normocephalic and atraumatic Eyes:  sclerae anicteric, EOMI Ears: Normal auditory acuity Mouth: No deformity or lesions Lungs: Clear throughout to auscultation Heart: Regular rate and rhythm; no murmurs, rubs or bruits Abdomen: Soft, non tender and non distended. No masses, hepatosplenomegaly or hernias noted. Normal Bowel sounds Rectal: Trace heme + brown stool, mild anal canal deformity, external hemorrhoidal tags, mild perianal rash, not tenderness or fluctuance  Musculoskeletal: Symmetrical with no gross deformities  Pulses:  Normal pulses noted Extremities: No clubbing, cyanosis, edema or deformities noted Neurological: Alert oriented x 4, grossly nonfocal Psychological:  Alert and cooperative. Normal mood and affect   Assessment and Recommendations:  1.   Small-volume rectal bleeding, rectal discomfort, heme positive stool, Crohn's ileocolitis status post ileocecectomy.  Possible perianal fungal infection.  Rule out Crohn's disease, hemorrhoids, neoplasms and other disorders.  Advised proceeding with colonoscopy for further evaluation he prefers trial of Lotrimin AF 3 times daily for 10 days and rectal care instructions for 10 days.  If symptoms have not completely resolved in 10 days I strongly advised proceeding with colonoscopy.  We discussed COVID-19 and the potential for a COVID-19 infection to be more severe in patients who have active, untreated inflammatory conditions.

## 2018-10-23 ENCOUNTER — Other Ambulatory Visit: Payer: Self-pay | Admitting: Nurse Practitioner

## 2018-11-01 ENCOUNTER — Other Ambulatory Visit: Payer: Self-pay

## 2018-11-01 ENCOUNTER — Ambulatory Visit: Payer: BC Managed Care – PPO | Admitting: Nurse Practitioner

## 2018-11-01 ENCOUNTER — Encounter: Payer: Self-pay | Admitting: Nurse Practitioner

## 2018-11-01 VITALS — BP 116/78 | HR 83 | Temp 98.6°F | Ht 69.0 in | Wt 246.8 lb

## 2018-11-01 DIAGNOSIS — Z803 Family history of malignant neoplasm of breast: Secondary | ICD-10-CM | POA: Diagnosis not present

## 2018-11-01 DIAGNOSIS — R222 Localized swelling, mass and lump, trunk: Secondary | ICD-10-CM | POA: Diagnosis not present

## 2018-11-01 NOTE — Progress Notes (Signed)
Subjective:     Patient ID: [name] [name] , male    DOB: [birth year] , 48 y.o.   MRN: [id number]   Chief Complaint  Patient presents with  . Knot on chest    HPI  He is here today he is complaining of a knot to the right chest and breast area. Noticed the area last Wednesday.   Was having pain, belching or yawning.  Denies having a fever or chills.  His mother has a history of breast cancer with a mastectomy.  Did not have to take any medication. No warm compresses.     Past Medical History:  Diagnosis Date  . Anemia    iron def  . Anxiety   . B12 deficiency   . 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 Hemorrhoids   . Hypertension   . Prehypertension 03/09/2013  . Pulmonary embolism ([facility])      Family History  Problem Relation Age of Onset  . Breast cancer Mother   . Diabetes Father   . COPD Father   . Crohn's disease Other   . Colon cancer Neg Hx      Current Outpatient Medications:  .  aspirin EC 81 MG tablet, Take 81 mg by mouth daily., Disp: , Rfl:  .  Ferrous Sulfate (IRON PO), Take 1 tablet by mouth daily at 12 noon. Take 1 65mtablet daily, Disp: , Rfl:  .  metoprolol succinate (TOPROL-XL) 25 MG 24 hr tablet, Take 1 tablet (25 mg total) by mouth daily., Disp: 30 tablet, Rfl: 2 .  Multiple Vitamin (MULTIVITAMIN WITH MINERALS) TABS tablet, Take 1 tablet by mouth daily., Disp: , Rfl:  .  Vitamin D, Ergocalciferol, (DRISDOL) 1.25 MG (50000 UT) CAPS capsule, TAKE 1 CAPSULE (50,000 UNITS TOTAL) BY MOUTH EVERY 7 (SEVEN) DAYS., Disp: 12 capsule, Rfl: 0 .  clonazePAM (KLONOPIN) 0.5 MG tablet, TAKE 0.5 TABLETS (0.25 MG TOTAL) BY MOUTH 2 (TWO) TIMES DAILY AS NEEDED FOR ANXIETY. (Patient not taking: Reported on 11/01/2018), Disp: 30 tablet, Rfl: 0 .  pantoprazole (PROTONIX) 40 MG tablet, TAKE 1 TABLET BY MOUTH EVERY DAY, Disp: 90 tablet, Rfl: 0   No Known Allergies   Review of Systems  Constitutional: Negative.   Respiratory: Negative.   Cardiovascular: Negative.   Negative for chest pain, palpitations and leg swelling.  Skin:       midchest area with lump.   Psychiatric/Behavioral: Negative.      Today's Vitals   11/01/18 0841  BP: 116/78  Pulse: 83  Temp: 98.6 F (37 C)  TempSrc: Oral  Weight: 246 lb 12.8 oz (111.9 kg)  Height: 5' 9"  (1.753 m)   Body mass index is 36.45 kg/m.   Objective:  Physical Exam Constitutional:      Appearance: Normal appearance.  Cardiovascular:     Rate and Rhythm: Normal rate and regular rhythm.     Pulses: Normal pulses.     Heart sounds: Normal heart sounds. No murmur.  Pulmonary:     Effort: Pulmonary effort is normal. No respiratory distress.     Breath sounds: Normal breath sounds.  Skin:    Comments: Mild lump to middle of chest near midsternum above xyphoid.  Neurological:     General: No focal deficit present.     Mental Status: He is alert and oriented to person, place, and time.  Psychiatric:        Mood and Affect: Mood normal.  Behavior: Behavior normal.        Thought Content: Thought content normal.        Judgment: Judgment normal.         Assessment And Plan:     1. Lump in chest  Complains of lump to midchest near xyphoid process mildly palpable lump felt above sternal bone  Due to family history of breast cancer will obtain an ultrasound.   - [place] CHEST SOFT TISSUE; Future   [name], FNP    THE PATIENT IS ENCOURAGED TO PRACTICE SOCIAL DISTANCING DUE TO THE COVID-19 PANDEMIC.

## 2018-11-08 ENCOUNTER — Other Ambulatory Visit: Payer: Self-pay | Admitting: Nurse Practitioner

## 2018-11-21 ENCOUNTER — Ambulatory Visit
Admission: RE | Admit: 2018-11-21 | Discharge: 2018-11-21 | Disposition: A | Discharge status: Home / Self Care | Payer: BC Managed Care – PPO | Source: Ambulatory Visit | Attending: Nurse Practitioner | Admitting: Nurse Practitioner

## 2018-11-21 DIAGNOSIS — R222 Localized swelling, mass and lump, trunk: Secondary | ICD-10-CM

## 2019-01-08 ENCOUNTER — Encounter: Payer: Self-pay | Admitting: Nurse Practitioner

## 2019-01-08 ENCOUNTER — Ambulatory Visit (INDEPENDENT_AMBULATORY_CARE_PROVIDER_SITE_OTHER): Payer: BC Managed Care – PPO | Admitting: Nurse Practitioner

## 2019-01-08 ENCOUNTER — Other Ambulatory Visit: Payer: Self-pay

## 2019-01-08 VITALS — BP 116/80 | HR 99 | Temp 98.4°F | Ht 70.2 in | Wt 245.6 lb

## 2019-01-08 DIAGNOSIS — R7309 Other abnormal glucose: Secondary | ICD-10-CM

## 2019-01-08 DIAGNOSIS — F419 Anxiety disorder, unspecified: Secondary | ICD-10-CM

## 2019-01-08 DIAGNOSIS — I10 Essential (primary) hypertension: Secondary | ICD-10-CM

## 2019-01-08 DIAGNOSIS — K5080 Crohn's disease of both small and large intestine without complications: Secondary | ICD-10-CM

## 2019-01-08 DIAGNOSIS — N529 Male erectile dysfunction, unspecified: Secondary | ICD-10-CM

## 2019-01-08 DIAGNOSIS — E559 Vitamin D deficiency, unspecified: Secondary | ICD-10-CM

## 2019-01-08 DIAGNOSIS — Z6835 Body mass index (BMI) 35.0-35.9, adult: Secondary | ICD-10-CM

## 2019-01-08 NOTE — Progress Notes (Signed)
This visit occurred during the SARS-CoV-2 public health emergency.  Safety protocols were in place, including screening questions prior to the visit, additional usage of staff PPE, and extensive cleaning of exam room while observing appropriate contact time as indicated for disinfecting solutions.  Subjective:     Patient ID: [name] [name] , male    DOB: [birth year] , 49 y.o.   MRN: [id number]   Chief Complaint  Patient presents with  . Anxiety    HPI  He states "everything has been going good", has been able to sleep.  He is taking   He has seen the urologist  Anxiety Presents for follow-up visit. Symptoms include nervous/anxious behavior. Patient reports no chest pain, decreased concentration, depressed mood, dizziness, insomnia or palpitations. The quality of sleep is fair. Nighttime awakenings: none.   Side effects of treatment include headaches.     Past Medical History:  Diagnosis Date  . Anemia    iron def  . Anxiety   . B12 deficiency   . Crohn's ileocolitis ([place])    65m since 2007  . Hemorrhoids   . Hypertension   . Prehypertension 03/09/2013  . Pulmonary embolism (HCC)      Family History  Problem Relation Age of Onset  . Breast cancer Mother   . Diabetes Father   . COPD Father   . Crohn's disease Other   . Colon cancer Neg Hx      Current Outpatient Medications:  .  aspirin EC 81 MG tablet, Take 81 mg by mouth daily., Disp: , Rfl:  .  Ferrous Sulfate (IRON PO), Take 1 tablet by mouth daily at 12 noon. Take 1 652mtablet daily, Disp: , Rfl:  .  metoprolol succinate (TOPROL-XL) 25 MG 24 hr tablet, TAKE 1 TABLET BY MOUTH EVERY DAY, Disp: 90 tablet, Rfl: 1 .  Multiple Vitamin (MULTIVITAMIN WITH MINERALS) TABS tablet, Take 1 tablet by mouth daily., Disp: , Rfl:  .  pantoprazole (PROTONIX) 40 MG tablet, TAKE 1 TABLET BY MOUTH EVERY DAY, Disp: 90 tablet, Rfl: 0 .  clonazePAM (KLONOPIN) 0.5 MG tablet, TAKE 0.5 TABLETS (0.25 MG TOTAL) BY MOUTH 2 (TWO) TIMES  DAILY AS NEEDED FOR ANXIETY. (Patient not taking: Reported on 11/01/2018), Disp: 30 tablet, Rfl: 0 .  Vitamin D, Ergocalciferol, (DRISDOL) 1.25 MG (50000 UT) CAPS capsule, TAKE 1 CAPSULE (50,000 UNITS TOTAL) BY MOUTH EVERY 7 (SEVEN) DAYS. (Patient not taking: Reported on 01/08/2019), Disp: 12 capsule, Rfl: 0   No Known Allergies   Review of Systems  Constitutional: Negative.   Respiratory: Negative.  Negative for cough.   Cardiovascular: Negative for chest pain, palpitations and leg swelling.  Neurological: Negative for dizziness.  Psychiatric/Behavioral: Negative for agitation and decreased concentration. The patient is nervous/anxious. The patient does not have insomnia.      Today's Vitals   01/08/19 0854  BP: 116/80  Pulse: 99  Temp: 98.4 F (36.9 C)  TempSrc: Oral  Weight: 245 lb 9.6 oz (111.4 kg)  Height: 5' 10.2" (1.783 m)  PainSc: 0-No pain   Body mass index is 35.04 kg/m.   Objective:  Physical Exam Vitals reviewed.  Constitutional:      Appearance: Normal appearance.  Cardiovascular:     Rate and Rhythm: Normal rate and regular rhythm.     Pulses: Normal pulses.     Heart sounds: Normal heart sounds. No murmur.  Pulmonary:     Effort: Pulmonary effort is normal. No respiratory distress.     Breath sounds:  Normal breath sounds.  Skin:    General: Skin is warm and dry.     Capillary Refill: Capillary refill takes less than 2 seconds.  Neurological:     General: No focal deficit present.     Mental Status: He is alert and oriented to person, place, and time.         Assessment And Plan:     1. Anxiety  Chronic, this is currently controlled and he is rarely taking klonazepam  Reports he is doing well and sleeping better  2. Erectile dysfunction, unspecified erectile dysfunction type  Chronic, he reports he is being followed by Urology and is currently taking testosterone and another medication that he is unsure of and will call with the name.   He has  completed a medical record release for [facility]  3. Abnormal glucose  Chronic, not currently taking any medications.  Encouraged to increase physical activity. - Lipid Profile - Hemoglobin A1c  4. Crohn's disease of both small and large intestine without complication (HCC)  Chronic, being followed by GI - CMP14+EGFR  5. Essential hypertension . B/P is well controlled.  . CMP ordered to check renal function.  . The importance of regular exercise and dietary modification was stressed to the patient.  - CMP14+EGFR  6. Body mass index (BMI) of 35.0-35.9 in adult  Focus on health diet and regular exercise.  7. Vitamin D deficiency  Will check vitamin D level and supplement as needed.     Also encouraged to spend 15 minutes in the sun daily.   Will send high dose depending on his results. - Vitamin D (25 hydroxy)   [name], FNP    THE PATIENT IS ENCOURAGED TO PRACTICE SOCIAL DISTANCING DUE TO THE COVID-19 PANDEMIC.

## 2019-01-09 ENCOUNTER — Telehealth: Payer: Self-pay

## 2019-01-09 ENCOUNTER — Other Ambulatory Visit: Payer: Self-pay | Admitting: Nurse Practitioner

## 2019-01-09 DIAGNOSIS — E559 Vitamin D deficiency, unspecified: Secondary | ICD-10-CM

## 2019-01-09 LAB — CMP14+EGFR
ALT: 30 IU/L (ref 0–44)
AST: 24 IU/L (ref 0–40)
Albumin/Globulin Ratio: 1.6 (ref 1.2–2.2)
Albumin: 4.1 g/dL (ref 4.0–5.0)
Alkaline Phosphatase: 70 IU/L (ref 39–117)
BUN/Creatinine Ratio: 8 — ABNORMAL LOW (ref 9–20)
BUN: 11 mg/dL (ref 6–24)
Bilirubin Total: 0.4 mg/dL (ref 0.0–1.2)
CO2: 23 mmol/L (ref 20–29)
Calcium: 9.2 mg/dL (ref 8.7–10.2)
Chloride: 108 mmol/L — ABNORMAL HIGH (ref 96–106)
Creatinine, Ser: 1.33 mg/dL — ABNORMAL HIGH (ref 0.76–1.27)
GFR calc Af Amer: 73 mL/min/{1.73_m2} (ref >59)
GFR calc non Af Amer: 63 mL/min/{1.73_m2} (ref >59)
Globulin, Total: 2.6 g/dL (ref 1.5–4.5)
Glucose: 147 mg/dL — ABNORMAL HIGH (ref 65–99)
Potassium: 3.8 mmol/L (ref 3.5–5.2)
Sodium: 142 mmol/L (ref 134–144)
Total Protein: 6.7 g/dL (ref 6.0–8.5)

## 2019-01-09 LAB — LIPID PANEL
Chol/HDL Ratio: 4.3 ratio (ref 0.0–5.0)
Cholesterol, Total: 164 mg/dL (ref 100–199)
HDL: 38 mg/dL — ABNORMAL LOW (ref >39)
LDL Chol Calc (NIH): 106 mg/dL — ABNORMAL HIGH (ref 0–99)
Triglycerides: 111 mg/dL (ref 0–149)
VLDL Cholesterol Cal: 20 mg/dL (ref 5–40)

## 2019-01-09 LAB — HEMOGLOBIN A1C
Est. average glucose Bld gHb Est-mCnc: 111 mg/dL
Hgb A1c MFr Bld: 5.5 % (ref 4.8–5.6)

## 2019-01-09 LAB — VITAMIN D 25 HYDROXY (VIT D DEFICIENCY, FRACTURES): Vit D, 25-Hydroxy: 36.5 ng/mL (ref 30.0–100.0)

## 2019-01-09 MED ORDER — VITAMIN D (ERGOCALCIFEROL) 1.25 MG (50000 UNIT) PO CAPS: 50000.0000 [IU] | ORAL_CAPSULE | ORAL | 1 refills | Status: DC

## 2019-01-09 NOTE — Telephone Encounter (Signed)
Left vm for pt to call back for lab results

## 2019-01-09 NOTE — Telephone Encounter (Signed)
-----   Message from [name], [place] sent at 01/09/2019 10:15 AM EST ----- Kidney functions are back declined a little, be sure to stay well hydrated with water.  Liver functions are normal.  Total cholesterol is normal.  HDL is slightly lower this is your good cholesterol should be greater than 40, increase good fats.  LDL is 106 goal is less than 99.  Your HgbA1c is normal at 5.5 this is good.  Vitamin d is normal at 36.5 continue your current vitamin d supplement

## 2019-03-14 ENCOUNTER — Encounter: Payer: Self-pay | Admitting: Nurse Practitioner

## 2019-03-14 ENCOUNTER — Other Ambulatory Visit: Payer: Self-pay

## 2019-03-14 ENCOUNTER — Ambulatory Visit: Payer: BC Managed Care – PPO | Admitting: Nurse Practitioner

## 2019-03-14 VITALS — BP 120/76 | HR 88 | Temp 98.2°F | Ht 72.2 in | Wt 245.0 lb

## 2019-03-14 DIAGNOSIS — M545 Low back pain, unspecified: Secondary | ICD-10-CM

## 2019-03-14 LAB — POCT URINALYSIS DIPSTICK
Bilirubin, UA: NEGATIVE
Blood, UA: NEGATIVE
Glucose, UA: NEGATIVE
Ketones, UA: NEGATIVE
Leukocytes, UA: NEGATIVE
Nitrite, UA: NEGATIVE
Protein, UA: NEGATIVE
Spec Grav, UA: >=1.03 — AB (ref 1.010–1.025)
Urobilinogen, UA: 1 E.U./dL
pH, UA: 6 (ref 5.0–8.0)

## 2019-03-14 MED ORDER — CYCLOBENZAPRINE HCL 10 MG PO TABS: 10.0000 mg | ORAL_TABLET | Freq: Three times a day (TID) | ORAL | 0 refills | Status: DC | PRN

## 2019-03-14 NOTE — Patient Instructions (Signed)

## 2019-03-14 NOTE — Progress Notes (Signed)
This visit occurred during the SARS-CoV-2 public health emergency.  Safety protocols were in place, including screening questions prior to the visit, additional usage of staff PPE, and extensive cleaning of exam room while observing appropriate contact time as indicated for disinfecting solutions.  Subjective:     Patient ID: [name] [name] , male    DOB: 1970/01/11 , 49 y.o.   MRN: [id number]   Chief Complaint  Patient presents with  . Back Pain    HPI  Last week was unable to urinate as well. Yesterday was unable to move around.  Feels like muscle spasms yesterday. He took an exlax to help him to have a bowel movement, took Miralax today. He took naproxyn.      Back Pain This is a new problem. The current episode started 1 to 4 weeks ago. The problem occurs intermittently. The pain does not radiate. The pain is moderate. The symptoms are aggravated by bending and sitting. Pertinent negatives include no chest pain or headaches. He has tried bed rest for the symptoms.     Past Medical History:  Diagnosis Date  . Anemia    iron def  . Anxiety   . B12 deficiency   . Crohn's ileocolitis ([place])    67m since 2007  . Hemorrhoids   . Hypertension   . Prehypertension 03/09/2013  . Pulmonary embolism (HCC)      Family History  Problem Relation Age of Onset  . Breast cancer Mother   . Diabetes Father   . COPD Father   . Crohn's disease Other   . Colon cancer Neg Hx      Current Outpatient Medications:  .  aspirin EC 81 MG tablet, Take 81 mg by mouth daily., Disp: , Rfl:  .  clonazePAM (KLONOPIN) 0.5 MG tablet, TAKE 0.5 TABLETS (0.25 MG TOTAL) BY MOUTH 2 (TWO) TIMES DAILY AS NEEDED FOR ANXIETY., Disp: 30 tablet, Rfl: 0 .  Ferrous Sulfate (IRON PO), Take 1 tablet by mouth daily at 12 noon. Take 1 631mtablet daily, Disp: , Rfl:  .  metoprolol succinate (TOPROL-XL) 25 MG 24 hr tablet, TAKE 1 TABLET BY MOUTH EVERY DAY, Disp: 90 tablet, Rfl: 1 .  Multiple Vitamin (MULTIVITAMIN WITH  MINERALS) TABS tablet, Take 1 tablet by mouth daily., Disp: , Rfl:  .  pantoprazole (PROTONIX) 40 MG tablet, TAKE 1 TABLET BY MOUTH EVERY DAY, Disp: 90 tablet, Rfl: 0 .  Vitamin D, Ergocalciferol, (DRISDOL) 1.25 MG (50000 UT) CAPS capsule, Take 1 capsule (50,000 Units total) by mouth every 7 (seven) days., Disp: 12 capsule, Rfl: 1   No Known Allergies   Review of Systems  Constitutional: Negative.   Respiratory: Negative.   Cardiovascular: Negative.  Negative for chest pain, palpitations and leg swelling.  Musculoskeletal: Positive for back pain.  Neurological: Negative for dizziness and headaches.  Psychiatric/Behavioral: Negative.      Today's Vitals   03/14/19 1446  BP: 120/76  Pulse: 88  Temp: 98.2 F (36.8 C)  TempSrc: Oral  SpO2: 93%  Weight: 245 lb (111.1 kg)  Height: 6' 0.2" (1.834 m)  PainSc: 2   PainLoc: Back   Body mass index is 33.04 kg/m.   Objective:  Physical Exam Constitutional:      General: He is not in acute distress.    Appearance: Normal appearance.  Cardiovascular:     Rate and Rhythm: Normal rate and regular rhythm.  Musculoskeletal:        General: Tenderness (tension to low  back muscle) present. No swelling or deformity.     Comments: Negative straight leg raise. Movement is somewhat guarded  Skin:    Capillary Refill: Capillary refill takes less than 2 seconds.  Neurological:     General: No focal deficit present.     Mental Status: He is alert and oriented to person, place, and time.         Assessment And Plan:     1. Acute bilateral low back pain without sciatica  Urinalysis is normal  Will provide him with a muscle relaxer and rest   Encouraged to stretch his low back as well - cyclobenzaprine (FLEXERIL) 10 MG tablet; Take 1 tablet (10 mg total) by mouth 3 (three) times daily as needed for muscle spasms.  Dispense: 30 tablet; Refill: 0 - POCT Urinalysis Dipstick (81002)   [name], FNP    THE PATIENT IS ENCOURAGED TO  PRACTICE SOCIAL DISTANCING DUE TO THE COVID-19 PANDEMIC.

## 2019-03-22 ENCOUNTER — Telehealth: Payer: Self-pay

## 2019-03-22 NOTE — Telephone Encounter (Signed)
PT WIFE CALLED WANTING TO SCHEDULE ANOTHER APPT FOR PT BACK PAIN. ADV HER THAT BEING HE WAS JUST HERE ON 3/10 PER PROVIDER SHE WILL  REFER TO SPECIALIST OR HE CAN SEEK CARE AT MEDICAL ATTENTION AT [place] [facility] AGREED TO DO SO NO REFERRAL WILL BE PUT IN

## 2019-04-03 ENCOUNTER — Other Ambulatory Visit: Payer: Self-pay | Admitting: Nurse Practitioner

## 2019-04-05 ENCOUNTER — Ambulatory Visit: Payer: BC Managed Care – PPO | Attending: Family

## 2019-04-05 DIAGNOSIS — Z23 Encounter for immunization: Secondary | ICD-10-CM

## 2019-04-05 NOTE — Progress Notes (Signed)
   Covid-19 Vaccination Clinic  Name:  DONTAE MINERVA    MRN: [id number] DOB: 1970/12/27  04/05/2019  Mr. Sanna was observed post Covid-19 immunization for 15 minutes without incident. He was provided with Vaccine Information Sheet and instruction to access the V-Safe system.   Mr. Kijowski was instructed to call 911 with any severe reactions post vaccine: Marland Kitchen Difficulty breathing  . Swelling of face and throat  . A fast heartbeat  . A bad rash all over body  . Dizziness and weakness   Immunizations Administered    Name Date Dose VIS Date Route   Moderna COVID-19 Vaccine 04/05/2019 10:27 AM 0.5 mL 12/05/2018 Intramuscular   Manufacturer: Moderna   Lot: [id number]   [place]: [id number]

## 2019-04-28 ENCOUNTER — Other Ambulatory Visit: Payer: Self-pay

## 2019-04-28 ENCOUNTER — Encounter (HOSPITAL_COMMUNITY): Payer: Self-pay

## 2019-04-28 ENCOUNTER — Ambulatory Visit (HOSPITAL_COMMUNITY)
Admission: EM | Admit: 2019-04-28 | Discharge: 2019-04-28 | Disposition: A | Discharge status: Home / Self Care | Payer: BC Managed Care – PPO | Attending: Family Medicine | Admitting: Family Medicine

## 2019-04-28 DIAGNOSIS — K6289 Other specified diseases of anus and rectum: Secondary | ICD-10-CM | POA: Diagnosis not present

## 2019-04-28 MED ORDER — FLUCONAZOLE 150 MG PO TABS: 150.0000 mg | ORAL_TABLET | Freq: Every morning | ORAL | 0 refills | Status: DC

## 2019-04-28 MED ORDER — HYDROCORTISONE (PERIANAL) 2.5 % EX CREA: 1.0000 "application " | TOPICAL_CREAM | Freq: Two times a day (BID) | CUTANEOUS | 0 refills | Status: DC

## 2019-04-28 NOTE — ED Provider Notes (Signed)
[place]    CSN: [id number] Arrival date & time: 04/28/19  1304      History   Chief Complaint Chief Complaint  Patient presents with  . Hemorrhoids    HPI [name] [name] is a 49 y.o. male.   Initial [facility] patient visit  49 yo man who presents with two weeks of what he thinks are hemorrhoids.  Small amount of bleeding yesterday, with burning and incomplete emptying with BM's.  He can feel small swelling  Lotrisone worked last October when he had similar symptoms.  Patient works at [employer]     Past Medical History:  Diagnosis Date  . Anemia    iron def  . Anxiety   . B12 deficiency   . Crohn's ileocolitis ([place])    94m since 2007  . Hemorrhoids   . Hypertension   . Prehypertension 03/09/2013  . Pulmonary embolism ([facility]     Patient Active Problem List   Diagnosis Date Noted  . Anxiety 09/05/2018  . Fatigue 07/05/2018  . Erectile dysfunction 07/05/2018  . Snoring 07/05/2018  . 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 Hypogonadism male 03/12/2013  . Elevated ferritin level 03/12/2013  . Testosterone deficiency 03/09/2013  . Prehypertension 03/09/2013  . Vitamin D deficiency 03/05/2013  . OBESITY 01/21/2009  . VITAMIN B12 DEFICIENCY 04/25/2008  . [place]    Past Surgical History:  Procedure Laterality Date  . COLONOSCOPY    . EXTERNAL FIXATION LEG Left 02/27/2013   Procedure: EXTERNAL FIXATION LEFT KNEE ;  Surgeon: [name] MD;  [facility] [facility]  Service: Orthopedics;  Laterality: Left;  . EXTERNAL FIXATION LEG Left 03/01/2013   Procedure: REVISION OF EXTERNAL FIXATOR LEFT LEG;  Surgeon: [name] MD;  [place] [facility]  Service: Orthopedics;  Laterality: Left;  . ileocecal resection  03/2005  . ILEOSTOMY CLOSURE  08/2005  . ileostomy/anastomotic resection for anastomotic leak  03/2005  . KNEE ARTHROSCOPY Left 06/25/2013   Procedure: LYSIS  OF ADHESION LEFT KNEE;  Surgeon: [name] MD;  [place] [facility]  Service: Orthopedics;  Laterality: Left;  . LYSIS OF ADHESION Left 06/25/2013   WITH MANIPULATION     DR HANDY  . ORIF TIBIA FRACTURE Left 03/08/2013   Procedure: LEFT OPEN REDUCTION INTERNAL FIXATION (ORIF) TIBIA FRACTURE;  Surgeon: [name] MD;  [place] [facility]  Service: Orthopedics;  Laterality: Left;       Home Medications    Prior to Admission medications   Medication Sig Start Date End Date Taking? Authorizing Provider  aspirin EC 81 MG tablet Take 81 mg by mouth daily.    [provider]  clonazePAM (KLONOPIN) 0.5 MG tablet TAKE 0.5 TABLETS (0.25 MG TOTAL) BY MOUTH 2 (TWO) TIMES DAILY AS NEEDED FOR ANXIETY. 08/15/18 08/15/19  MMinette Brine FNP  cyclobenzaprine (FLEXERIL) 10 MG tablet Take 1 tablet (10 mg total) by mouth 3 (three) times daily as needed for muscle spasms. 03/14/19   MMinette Brine FNP  Ferrous Sulfate (IRON PO) Take 1 tablet by mouth daily at 12 noon. Take 1 663mtablet daily    [provider]  fluconazole (DIFLUCAN) 150 MG tablet Take 1 tablet (150 mg total) by mouth every morning. Repeat if needed 04/28/19   LaRobyn HaberMD  hydrocortisone (ANUSOL-HC) 2.5 % rectal cream Place 1 application rectally 2 (two) times daily. 04/28/19   LaRobyn HaberMD  metoprolol succinate (  TOPROL-XL) 25 MG 24 hr tablet TAKE 1 TABLET BY MOUTH EVERY DAY 11/08/18   [name], FNP  Multiple Vitamin (MULTIVITAMIN WITH MINERALS) TABS tablet Take 1 tablet by mouth daily.    [provider]  pantoprazole (PROTONIX) 40 MG tablet TAKE 1 TABLET BY MOUTH EVERY DAY 04/03/19   [name], FNP  Vitamin D, Ergocalciferol, (DRISDOL) 1.25 MG (50000 UT) CAPS capsule Take 1 capsule (50,000 Units total) by mouth every 7 (seven) days. 01/09/19   [name], FNP    Family History Family History  Problem Relation Age of Onset  . Breast cancer Mother   . Diabetes Father   . COPD Father   .  Crohn's disease Other   . Colon cancer Neg Hx     Social History Social History   Tobacco Use  . Smoking status: Never Smoker  . Smokeless tobacco: Never Used  Substance Use Topics  . Alcohol use: No  . Drug use: No     Allergies   Patient has no known allergies.   Review of Systems Review of Systems  Gastrointestinal: Positive for blood in stool and rectal pain.  All other systems reviewed and are negative.    Physical Exam Triage Vital Signs ED Triage Vitals  Enc Vitals Group     BP 04/28/19 1329 (!) 104/100     Pulse Rate 04/28/19 1329 92     Resp 04/28/19 1329 18     Temp 04/28/19 1329 98.9 F (37.2 C)     Temp Source 04/28/19 1329 Oral     SpO2 04/28/19 1329 100 %     Weight 04/28/19 1327 245 lb (111.1 kg)     Height --      Head Circumference --      Peak Flow --      Pain Score 04/28/19 1327 7     Pain Loc --      Pain Edu? --      Excl. in [place]? --    No data found.  Updated Vital Signs BP (!) 104/100 (BP Location: Right Arm)   Pulse 92   Temp 98.9 F (37.2 C) (Oral)   Resp 18   Wt 111.1 kg   SpO2 100%   BMI 33.04 kg/m    Physical Exam Vitals and nursing note reviewed.  Constitutional:      Appearance: Normal appearance. He is obese.  HENT:     Mouth/Throat:     Mouth: Mucous membranes are moist.  Eyes:     Conjunctiva/sclera: Conjunctivae normal.  Pulmonary:     Effort: Pulmonary effort is normal.  Genitourinary:    Comments: Small tag on right anal verge.  No external hemorrhoid.  Mild proctitis. Musculoskeletal:        General: Normal range of motion.     Cervical back: Normal range of motion and neck supple.  Skin:    General: Skin is warm.     Findings: Erythema present.  Neurological:     Mental Status: He is alert.     Gait: Gait normal.  Psychiatric:        Mood and Affect: Mood normal.        Behavior: Behavior normal.        Thought Content: Thought content normal.      UC Treatments / Results  Labs (all labs  ordered are listed, but only abnormal results are displayed) Labs Reviewed - No data to display  EKG   Radiology No results  found.  Procedures Procedures (including critical care time)  Medications Ordered in UC Medications - No data to display  Initial Impression / Assessment and Plan / UC Course  I have reviewed the triage vital signs and the nursing notes.  Pertinent labs & imaging results that were available during my care of the patient were reviewed by me and considered in my medical decision making (see chart for details).    Final Clinical Impressions(s) / UC Diagnoses   Final diagnoses:  Proctitis   Discharge Instructions   None    ED Prescriptions    Medication Sig Dispense Auth. Provider   fluconazole (DIFLUCAN) 150 MG tablet Take 1 tablet (150 mg total) by mouth every morning. Repeat if needed 7 tablet [name] Haber, MD   hydrocortisone (ANUSOL-HC) 2.5 % rectal cream Place 1 application rectally 2 (two) times daily. 30 g [name] Haber, MD     I have reviewed the PDMP during this encounter.   [name] Haber, MD 04/28/19 1359

## 2019-04-28 NOTE — ED Triage Notes (Signed)
Pt states he has been his hemorrhoids 2 weeks.

## 2019-05-08 ENCOUNTER — Ambulatory Visit: Payer: BC Managed Care – PPO | Attending: Family

## 2019-05-08 DIAGNOSIS — Z23 Encounter for immunization: Secondary | ICD-10-CM

## 2019-05-08 NOTE — Progress Notes (Signed)
   Covid-19 Vaccination Clinic  Name:  [name] [name]    MRN: [id number] DOB: [birth year]  05/08/2019  [name] was observed post Covid-19 immunization for 15 minutes without incident. He was provided with Vaccine Information Sheet and instruction to access the V-Safe system.   [name] was instructed to call 911 with any severe reactions post vaccine: Marland Kitchen Difficulty breathing  . Swelling of face and throat  . A fast heartbeat  . A bad rash all over body  . Dizziness and weakness   Immunizations Administered    Name Date Dose VIS Date Route   Moderna COVID-19 Vaccine 05/08/2019 10:02 AM 0.5 mL 12/2018 Intramuscular   Manufacturer: Moderna   Lot: [id number]   [place]: [id number]

## 2019-05-10 ENCOUNTER — Other Ambulatory Visit (INDEPENDENT_AMBULATORY_CARE_PROVIDER_SITE_OTHER): Payer: BC Managed Care – PPO

## 2019-05-10 ENCOUNTER — Encounter: Payer: Self-pay | Admitting: Gastroenterology

## 2019-05-10 ENCOUNTER — Ambulatory Visit: Payer: BC Managed Care – PPO | Admitting: Gastroenterology

## 2019-05-10 VITALS — BP 110/70 | HR 100 | Temp 98.3°F | Ht 69.0 in | Wt 246.0 lb

## 2019-05-10 DIAGNOSIS — K50819 Crohn's disease of both small and large intestine with unspecified complications: Secondary | ICD-10-CM | POA: Diagnosis not present

## 2019-05-10 DIAGNOSIS — K625 Hemorrhage of anus and rectum: Secondary | ICD-10-CM

## 2019-05-10 DIAGNOSIS — K6289 Other specified diseases of anus and rectum: Secondary | ICD-10-CM

## 2019-05-10 HISTORY — DX: Other specified diseases of anus and rectum: K62.89

## 2019-05-10 LAB — CBC WITH DIFFERENTIAL/PLATELET
Basophils Absolute: 0 10*3/uL (ref 0.0–0.1)
Basophils Relative: 1 % (ref 0.0–3.0)
Eosinophils Absolute: 0.2 10*3/uL (ref 0.0–0.7)
Eosinophils Relative: 4.1 % (ref 0.0–5.0)
HCT: 41.9 % (ref 39.0–52.0)
Hemoglobin: 13.9 g/dL (ref 13.0–17.0)
Lymphocytes Relative: 23.2 % (ref 12.0–46.0)
Lymphs Abs: 1.1 10*3/uL (ref 0.7–4.0)
MCHC: 33.1 g/dL (ref 30.0–36.0)
MCV: 81.8 fl (ref 78.0–100.0)
Monocytes Absolute: 0.8 10*3/uL (ref 0.1–1.0)
Monocytes Relative: 15.7 % — ABNORMAL HIGH (ref 3.0–12.0)
Neutro Abs: 2.7 10*3/uL (ref 1.4–7.7)
Neutrophils Relative %: 56 % (ref 43.0–77.0)
Platelets: 246 10*3/uL (ref 150.0–400.0)
RBC: 5.13 Mil/uL (ref 4.22–5.81)
RDW: 14.9 % (ref 11.5–15.5)
WBC: 4.8 10*3/uL (ref 4.0–10.5)

## 2019-05-10 LAB — BASIC METABOLIC PANEL
BUN: 12 mg/dL (ref 6–23)
CO2: 31 mEq/L (ref 19–32)
Calcium: 9.1 mg/dL (ref 8.4–10.5)
Chloride: 106 mEq/L (ref 96–112)
Creatinine, Ser: 1.46 mg/dL (ref 0.40–1.50)
GFR: 62.4 mL/min (ref >60.00)
Glucose, Bld: 83 mg/dL (ref 70–99)
Potassium: 4.1 mEq/L (ref 3.5–5.1)
Sodium: 139 mEq/L (ref 135–145)

## 2019-05-10 LAB — SEDIMENTATION RATE: Sed Rate: 24 mm/hr — ABNORMAL HIGH (ref 0–15)

## 2019-05-10 LAB — C-REACTIVE PROTEIN: CRP: 1.8 mg/dL (ref 0.5–20.0)

## 2019-05-10 MED ORDER — SUTAB 1479-225-188 MG PO TABS: 1.0000 | ORAL_TABLET | Freq: Once | ORAL | 0 refills | Status: AC

## 2019-05-10 NOTE — Progress Notes (Signed)
Reviewed and agree with management plan.  Ladesha Pacini T. Oluwatobi Ruppe, MD FACG [facility]  

## 2019-05-10 NOTE — Patient Instructions (Signed)
If you are age 48 or older, your body mass index should be between 23-30. Your Body mass index is 36.33 kg/m. If this is out of the aforementioned range listed, please consider follow up with your Primary Care Provider.  If you are age 91 or younger, your body mass index should be between 19-25. Your Body mass index is 36.33 kg/m. If this is out of the aformentioned range listed, please consider follow up with your Primary Care Provider.   Please go to the lab in the basement of our building to have lab work done as you leave today. Hit "B" for basement when you get on the elevator.  When the doors open the lab is on your left.  We will call you with the results. Thank you.  Due to recent changes in healthcare laws, you may see the results of your imaging and laboratory studies on MyChart before your provider has had a chance to review them.  We understand that in some cases there may be results that are confusing or concerning to you. Not all laboratory results come back in the same time frame and the provider may be waiting for multiple results in order to interpret others.  Please give [place] 48 hours in order for your provider to thoroughly review all the results before contacting the office for clarification of your results.    You have been scheduled for a colonoscopy. Please follow written instructions given to you at your visit today.  Please pick up your prep supplies at the pharmacy within the next 1-3 days. If you use inhalers (even only as needed), please bring them with you on the day of your procedure.   Thank you for entrusting me with your care and for choosing [employer], Alonza Bogus, P.A[facility] - C.

## 2019-05-10 NOTE — Progress Notes (Signed)
05/10/2019 [name] [name] [id number] 09-04-1970   HISTORY OF PRESENT ILLNESS:  This is a pleasant 49 year old male who is a patient of Dr. Lynne Leader.  He follows here for history of Crohn's ileocolitis s/p ileocecectomy with colostomy and then reversal in 2007.  He has been asymptomatic for years and is no longer on IBD medications.  Was seen by Dr. Fuller Plan in October 2020 for complaints of rectal bleeding and rectal discomfort.  Dr. Fuller Plan recommended colonoscopy, but he declined at that time and opted for treatment of perianal rash.  He is here today with similar complaints.  He says that a few weeks ago [place] went [place] the [place] and he started having rectal discomfort and bleeding.  Says that that bleeding only occurred a couple of times before resolving.  When they came home he went to urgent care where they diagnosed him with "proctitis", but he says that they did not do any type of anoscopic exam.  Nonetheless, he began using hydrocortisone cream internally per rectum and his symptoms have improved.  He says that he had some diarrhea and loose stool around the time that his symptoms began as well, but usually has formed stools daily.  No abdominal pain at baseline.  Past Medical History:  Diagnosis Date  . Anemia    iron def  . Anxiety   . B12 deficiency   . Crohn's ileocolitis ([place])    28m since 2007  . Hemorrhoids   . Hypertension   . Prehypertension 03/09/2013  . Pulmonary embolism ([facility]    Past Surgical History:  Procedure Laterality Date  . COLONOSCOPY    . EXTERNAL FIXATION LEG Left 02/27/2013   Procedure: EXTERNAL FIXATION LEFT KNEE ;  Surgeon: [name] MD;  [place] [facility]  Service: Orthopedics;  Laterality: Left;  . EXTERNAL FIXATION LEG Left 03/01/2013   Procedure: REVISION OF EXTERNAL FIXATOR LEFT LEG;  Surgeon: [name] MD;  [place] [facility]  Service: Orthopedics;  Laterality: Left;  . ileocecal resection  03/2005  . ILEOSTOMY CLOSURE  08/2005    . ileostomy/anastomotic resection for anastomotic leak  03/2005  . KNEE ARTHROSCOPY Left 06/25/2013   Procedure: LYSIS OF ADHESION LEFT KNEE;  Surgeon: [name] MD;  [place]  Service: Orthopedics;  Laterality: Left;  . LYSIS OF ADHESION Left 06/25/2013   WITH MANIPULATION     DR HANDY  . ORIF TIBIA FRACTURE Left 03/08/2013   Procedure: LEFT OPEN REDUCTION INTERNAL FIXATION (ORIF) TIBIA FRACTURE;  Surgeon: [name] MD;  [place] [facility]  Service: Orthopedics;  Laterality: Left;    reports that he has never smoked. He has never used smokeless tobacco. He reports that he does not drink alcohol or use drugs. family history includes Breast cancer in his mother; COPD in his father; Crohn's disease in an other family member; Diabetes in his father. No Known Allergies    Outpatient Encounter Medications as of 05/10/2019  Medication Sig  . aspirin EC 81 MG tablet Take 81 mg by mouth daily.  . clonazePAM (KLONOPIN) 0.5 MG tablet TAKE 0.5 TABLETS (0.25 MG TOTAL) BY MOUTH 2 (TWO) TIMES DAILY AS NEEDED FOR ANXIETY.  . cyclobenzaprine (FLEXERIL) 10 MG tablet Take 1 tablet (10 mg total) by mouth 3 (three) times daily as needed for muscle spasms.  . Ferrous Sulfate (IRON PO) Take 1 tablet by mouth daily at 12 noon. Take 1 623mtablet daily  . fluconazole (DIFLUCAN)  150 MG tablet Take 1 tablet (150 mg total) by mouth every morning. Repeat if needed  . hydrocortisone (ANUSOL-HC) 2.5 % rectal cream Place 1 application rectally 2 (two) times daily.  . metoprolol succinate (TOPROL-XL) 25 MG 24 hr tablet TAKE 1 TABLET BY MOUTH EVERY DAY  . Multiple Vitamin (MULTIVITAMIN WITH MINERALS) TABS tablet Take 1 tablet by mouth daily.  . pantoprazole (PROTONIX) 40 MG tablet TAKE 1 TABLET BY MOUTH EVERY DAY  . Vitamin D, Ergocalciferol, (DRISDOL) 1.25 MG (50000 UT) CAPS capsule Take 1 capsule (50,000 Units total) by mouth every 7 (seven) days.   No facility-administered encounter medications on file as  of 05/10/2019.     REVIEW OF SYSTEMS  : All other systems reviewed and negative except where noted in the History of Present Illness.   PHYSICAL EXAM: BP 110/70   Pulse 100   Temp 98.3 F (36.8 C)   Ht 5' 9"  (1.753 m)   Wt 246 lb (111.6 kg)   BMI 36.33 kg/m  General: Well developed AA male in no acute distress Head: Normocephalic and atraumatic Eyes:  Sclerae anicteric, conjunctiva pink. Ears: Normal auditory acuity Lungs: Clear throughout to auscultation; no increased WOB. Heart: Regular rate and rhythm; no M/R/G. Abdomen: Soft, non-distended.  BS present.  Non-tender.  Scars noted on abdomen from previous laparotomy and colostomy. Rectal:  Will be done at the time of colonoscopy. Musculoskeletal: Symmetrical with no gross deformities  Skin: No lesions on visible extremities Extremities: No edema  Neurological: Alert oriented x 4, grossly non-focal Psychological:  Alert and cooperative. Normal mood and affect  ASSESSMENT AND PLAN: *Small-volume rectal bleeding, rectal discomfort, history of Crohn's ileocolitis status post ileocecectomy.  Seen at Urgent Care and diagnosed with "proctitis" and was prescribed hydrocortisone.  Some improvement from that.  We discussed proceeding with colonoscopy since it has been almost 5 years since his last.  He is agreeable.  Will check CBC, BMP, sed rate, and CRP as well.  Continue treatment with hydrocortisone for now.  **The risks, benefits, and alternatives to colonoscopy were discussed with the patient and he consents to proceed.   CC:  [name], FNP

## 2019-05-16 ENCOUNTER — Encounter (HOSPITAL_COMMUNITY): Payer: Self-pay

## 2019-05-16 ENCOUNTER — Other Ambulatory Visit: Payer: Self-pay

## 2019-05-16 ENCOUNTER — Ambulatory Visit (HOSPITAL_COMMUNITY)
Admission: EM | Admit: 2019-05-16 | Discharge: 2019-05-16 | Disposition: A | Discharge status: Home / Self Care | Payer: BC Managed Care – PPO | Attending: Family Medicine | Admitting: Family Medicine

## 2019-05-16 DIAGNOSIS — M542 Cervicalgia: Secondary | ICD-10-CM

## 2019-05-16 DIAGNOSIS — M25552 Pain in left hip: Secondary | ICD-10-CM

## 2019-05-16 DIAGNOSIS — R0781 Pleurodynia: Secondary | ICD-10-CM
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
## 2019-05-16 NOTE — Discharge Instructions (Signed)
Please try ice  Please try a muscle relaxer if needed  Please try ibuprofen for pain if needed Please follow up if your symptoms fail to improve.

## 2019-05-17 ENCOUNTER — Encounter: Payer: Self-pay | Admitting: Gastroenterology

## 2019-05-20 ENCOUNTER — Other Ambulatory Visit: Payer: Self-pay | Admitting: Nurse Practitioner

## 2019-05-29 ENCOUNTER — Other Ambulatory Visit: Payer: Self-pay

## 2019-05-29 ENCOUNTER — Encounter: Payer: Self-pay | Admitting: Gastroenterology

## 2019-05-29 ENCOUNTER — Ambulatory Visit (AMBULATORY_SURGERY_CENTER): Payer: BC Managed Care – PPO | Admitting: Gastroenterology

## 2019-05-29 VITALS — BP 122/84 | HR 79 | Temp 97.5°F | Resp 21 | Ht 69.0 in | Wt 246.0 lb

## 2019-05-29 DIAGNOSIS — K50011 Crohn's disease of small intestine with rectal bleeding: Secondary | ICD-10-CM | POA: Diagnosis not present

## 2019-05-29 DIAGNOSIS — K633 Ulcer of intestine: Secondary | ICD-10-CM | POA: Diagnosis not present

## 2019-05-29 DIAGNOSIS — K921 Melena: Secondary | ICD-10-CM

## 2019-05-29 MED ORDER — SODIUM CHLORIDE 0.9 % IV SOLN
500.0000 mL | Freq: Once | INTRAVENOUS | Status: DC
Start: 2019-05-29 — End: 2019-05-29

## 2019-05-29 NOTE — Progress Notes (Signed)
VS by DT.

## 2019-05-29 NOTE — Op Note (Signed)
[facility] Patient Name: [name] [name] Procedure Date: 05/29/2019 9:10 AM MRN: [id number] Endoscopist: Ladene Artist , MD Age: 49 Referring MD:  Date of Birth: 1970/04/24 Gender: Male Account #: [id number] Procedure:                Colonoscopy Indications:              Hematochezia Medicines:                Monitored Anesthesia Care Procedure:                Pre-Anesthesia Assessment:                           - Prior to the procedure, a History and Physical                            was performed, and patient medications and                            allergies were reviewed. The patient's tolerance of                            previous anesthesia was also reviewed. The risks                            and benefits of the procedure and the sedation                            options and risks were discussed with the patient.                            All questions were answered, and informed consent                            was obtained. Prior Anticoagulants: The patient has                            taken no previous anticoagulant or antiplatelet                            agents. ASA Grade Assessment: II - A patient with                            mild systemic disease. After reviewing the risks                            and benefits, the patient was deemed in                            satisfactory condition to undergo the procedure.                           After obtaining informed consent, the colonoscope  was passed under direct vision. Throughout the                            procedure, the patient's blood pressure, pulse, and                            oxygen saturations were monitored continuously. The                            Colonoscope was introduced through the anus and                            advanced to the 8 cm into the ileum. The terminal                            ileum and the rectum were photographed. The  quality                            of the bowel preparation was adequate after                            extensive lavage, suction. The colonoscopy was                            performed without difficulty. The patient tolerated                            the procedure well. Scope In: 9:14:47 AM Scope Out: 9:31:53 AM Scope Withdrawal Time: 0 hours 10 minutes 22 seconds  Total Procedure Duration: 0 hours 17 minutes 6 seconds  Findings:                 The perianal and digital rectal examinations were                            normal.                           There was evidence of a prior end-to-side                            ileo-colonic anastomosis in the ascending colon.                            This was patent and was characterized by healthy                            appearing mucosa. The anastomosis was traversed.                           The neo-terminal ileum contained a few scattered                            non-bleeding erosions. No stigmata of recent  bleeding were seen. Biopsies were taken with a cold                            forceps for histology.                           Internal hemorrhoids were found during                            retroflexion. The hemorrhoids were medium-sized and                            Grade I (internal hemorrhoids that do not prolapse).                           The exam was otherwise without abnormality on                            direct and retroflexion views. Random biopsies                            obtained throughout. Complications:            No immediate complications. Estimated blood loss:                            None. Estimated Blood Loss:     Estimated blood loss: none. Impression:               - Patent end-to-side ileo-colonic anastomosis,                            characterized by healthy appearing mucosa.                           - A few erosions in the neo-terminal ileum.                             Biopsied.                           - Internal hemorrhoids.                           - The examination was otherwise normal on direct                            and retroflexion views. Random biopsies obtained. Recommendation:           - Repeat colonoscopy in 5 years for surveillance.                           - Patient has a contact number available for                            emergencies. The signs and symptoms of potential  delayed complications were discussed with the                            patient. Return to normal activities tomorrow.                            Written discharge instructions were provided to the                            patient.                           - Resume previous diet.                           - Continue present medications.                           - Await pathology results.                           - Prep H supp OTC PR bid prn hemorrhoidal symptoms. Ladene Artist, MD 05/29/2019 9:42:05 AM This report has been signed electronically.

## 2019-05-29 NOTE — Patient Instructions (Signed)
Discharge instructions given. °Handout on Hemorrhoids. °Resume previous medications. °YOU HAD AN ENDOSCOPIC PROCEDURE TODAY AT THE [facility]:   Refer to the procedure report that was given to you for any specific questions about what was found during the examination.  If the procedure report does not answer your questions, please call your gastroenterologist to clarify.  If you requested that your care partner not be given the details of your procedure findings, then the procedure report has been included in a sealed envelope for you to review at your convenience later. ° °YOU SHOULD EXPECT: Some feelings of bloating in the abdomen. Passage of more gas than usual.  Walking can help get rid of the air that was put into your GI tract during the procedure and reduce the bloating. If you had a lower endoscopy (such as a colonoscopy or flexible sigmoidoscopy) you may notice spotting of blood in your stool or on the toilet paper. If you underwent a bowel prep for your procedure, you may not have a normal bowel movement for a few days. ° °Please Note:  You might notice some irritation and congestion in your nose or some drainage.  This is from the oxygen used during your procedure.  There is no need for concern and it should clear up in a day or so. ° °SYMPTOMS TO REPORT IMMEDIATELY: ° °Following lower endoscopy (colonoscopy or flexible sigmoidoscopy): ° Excessive amounts of blood in the stool ° Significant tenderness or worsening of abdominal pains ° Swelling of the abdomen that is new, acute ° Fever of 100°F or higher ° ° °For urgent or emergent issues, a gastroenterologist can be reached at any hour by calling [phone]. °Do not use MyChart messaging for urgent concerns.  ° ° °DIET:  We do recommend a small meal at first, but then you may proceed to your regular diet.  Drink plenty of fluids but you should avoid alcoholic beverages for 24 hours. ° °ACTIVITY:  You should plan to take it easy for the  rest of today and you should NOT DRIVE or use heavy machinery until tomorrow (because of the sedation medicines used during the test).   ° °FOLLOW UP: °Our staff will call the number listed on your records 48-72 hours following your procedure to check on you and address any questions or concerns that you may have regarding the information given to you following your procedure. If we do not reach you, we will leave a message.  We will attempt to reach you two times.  During this call, we will ask if you have developed any symptoms of COVID 19. If you develop any symptoms (ie: fever, flu-like symptoms, shortness of breath, cough etc.) before then, please call [phone].  If you test positive for Covid 19 in the 2 weeks post procedure, please call and report this information to us.   ° °If any biopsies were taken you will be contacted by phone or by letter within the next 1-3 weeks.  Please call us at [phone] if you have not heard about the biopsies in 3 weeks.  ° ° °SIGNATURES/CONFIDENTIALITY: °You and/or your care partner have signed paperwork which will be entered into your electronic medical record.  These signatures attest to the fact that that the information above on your After Visit Summary has been reviewed and is understood.  Full responsibility of the confidentiality of this discharge information lies with you and/or your care-partner.  °

## 2019-05-29 NOTE — Progress Notes (Signed)
Called to room to assist during endoscopic procedure.  Patient ID and intended procedure confirmed with present staff. Received instructions for my participation in the procedure from the performing physician.  

## 2019-05-29 NOTE — Progress Notes (Signed)
Report to PACU, RN, vss, BBS= Clear.  

## 2019-05-31 ENCOUNTER — Telehealth: Payer: Self-pay

## 2019-05-31 NOTE — Telephone Encounter (Signed)
  Follow up Call-  Call back number 05/29/2019  Post procedure Call Back phone  # [phone]  Permission to leave phone message Yes  Some recent data might be hidden     Patient questions:  Do you have a fever, pain , or abdominal swelling? No. Pain Score  0 *  Have you tolerated food without any problems? Yes.    Have you been able to return to your normal activities? Yes.    Do you have any questions about your discharge instructions: Diet   No. Medications  No. Follow up visit  No.  Do you have questions or concerns about your Care? No.  Actions: * If pain score is 4 or above: No action needed, pain <4. 1. Have you developed a fever since your procedure? no  2.   Have you had an respiratory symptoms (SOB or cough) since your procedure? no  3.   Have you tested positive for COVID 19 since your procedure no  4.   Have you had any family members/close contacts diagnosed with the COVID 19 since your procedure?  no   If yes to any of these questions please route to Joylene John, RN and Erenest Rasher, RN

## 2019-06-25 ENCOUNTER — Encounter: Payer: Self-pay | Admitting: Gastroenterology

## 2019-06-26 ENCOUNTER — Encounter: Payer: Self-pay | Admitting: Nurse Practitioner

## 2019-06-26 ENCOUNTER — Ambulatory Visit: Payer: BC Managed Care – PPO | Admitting: Nurse Practitioner

## 2019-06-26 ENCOUNTER — Other Ambulatory Visit: Payer: Self-pay

## 2019-06-26 VITALS — BP 122/80 | HR 105 | Temp 98.7°F | Ht 72.21 in | Wt 243.8 lb

## 2019-06-26 DIAGNOSIS — L0292 Furuncle, unspecified: Secondary | ICD-10-CM | POA: Diagnosis not present

## 2019-06-26 DIAGNOSIS — L03111 Cellulitis of right axilla: Secondary | ICD-10-CM

## 2019-06-26 MED ORDER — CEPHALEXIN 500 MG PO CAPS: 500.0000 mg | ORAL_CAPSULE | Freq: Four times a day (QID) | ORAL | 0 refills | Status: AC

## 2019-06-26 NOTE — Progress Notes (Signed)
This visit occurred during the SARS-CoV-2 public health emergency.  Safety protocols were in place, including screening questions prior to the visit, additional usage of staff PPE, and extensive cleaning of exam room while observing appropriate contact time as indicated for disinfecting solutions.  Subjective:     Patient ID: [name] [name] , male    DOB: Jun 01, 1970 , 49 y.o.   MRN: [id number]   Chief Complaint  Patient presents with  . Mass    patient stated he thinks he may have a boil or cyst that has gotten bigger over the weekend    HPI  Here today for possible boil to right axilla. Has been there for a month worsened since Saturday.  He is having pain with movement - 3/10 mostly.  Minimal drainage. Has placed a hot wash cloth.  Denies fever. Does not shave underneath axilla.    Past Medical History:  Diagnosis Date  . Anemia    iron def  . Anxiety   . B12 deficiency   . 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 Hemorrhoids   . Hypertension   . Prehypertension 03/09/2013  . Pulmonary embolism (HCC)      Family History  Problem Relation Age of Onset  . Breast cancer Mother   . Diabetes Father   . COPD Father   . Crohn's disease Other   . Colon cancer Neg Hx   . Rectal cancer Neg Hx   . Stomach cancer Neg Hx      Current Outpatient Medications:  .  aspirin EC 81 MG tablet, Take 81 mg by mouth daily., Disp: , Rfl:  .  Ferrous Sulfate (IRON PO), Take 1 tablet by mouth daily at 12 noon. Take 1 665mtablet daily, Disp: , Rfl:  .  metoprolol succinate (TOPROL-XL) 25 MG 24 hr tablet, TAKE 1 TABLET BY MOUTH EVERY DAY, Disp: 90 tablet, Rfl: 1 .  Multiple Vitamin (MULTIVITAMIN WITH MINERALS) TABS tablet, Take 1 tablet by mouth daily., Disp: , Rfl:  .  pantoprazole (PROTONIX) 40 MG tablet, TAKE 1 TABLET BY MOUTH EVERY DAY, Disp: 90 tablet, Rfl: 0 .  Vitamin D, Ergocalciferol, (DRISDOL) 1.25 MG (50000 UT) CAPS capsule, Take 1 capsule (50,000 Units total) by mouth every 7  (seven) days., Disp: 12 capsule, Rfl: 1   No Known Allergies   Review of Systems  Constitutional: Negative.   Respiratory: Negative.   Cardiovascular: Negative for chest pain, palpitations and leg swelling.  Skin:       Right under arm with a hard knot, minimal pain but does have a little discomfort when doing activities such as getting up from the chair and reaching  Neurological: Negative for dizziness and headaches.  Psychiatric/Behavioral: Negative.      Today's Vitals   06/26/19 1000  BP: 122/80  Pulse: (!) 105  Temp: 98.7 F (37.1 C)  TempSrc: Oral  Weight: 243 lb 12.8 oz (110.6 kg)  Height: 6' 0.21" (1.834 m)  PainSc: 2    Body mass index is 32.88 kg/m.   Objective:  Physical Exam Constitutional:      General: He is not in acute distress.    Appearance: Normal appearance. He is obese.  Cardiovascular:     Rate and Rhythm: Normal rate and regular rhythm.     Pulses: Normal pulses.     Heart sounds: Normal heart sounds. No murmur heard.   Pulmonary:     Effort: Pulmonary effort is normal. No respiratory distress.  Breath sounds: Normal breath sounds.  Skin:    General: Skin is warm.     Comments: Has large firm boil present to right axilla, there is erythema present with streaking.    Neurological:     General: No focal deficit present.     Mental Status: He is alert and oriented to person, place, and time.     Cranial Nerves: No cranial nerve deficit.  Psychiatric:        Mood and Affect: Mood normal.        Behavior: Behavior normal.        Thought Content: Thought content normal.        Judgment: Judgment normal.         Assessment And Plan:     1. Boil  Right arm with large boil with reddened and firm area  Advised to use warm compresses.   He is to return in 2 days to reevaluate to see if softens, this may need an I & D.   - cephALEXin (KEFLEX) 500 MG capsule; Take 1 capsule (500 mg total) by mouth 4 (four) times daily for 10 days.   Dispense: 40 capsule; Refill: 0  2. Cellulitis of right axilla  Will treat with cephalexin  Advised to take antibiotic until completely gone.  - cephALEXin (KEFLEX) 500 MG capsule; Take 1 capsule (500 mg total) by mouth 4 (four) times daily for 10 days.  Dispense: 40 capsule; Refill: 0   [name], FNP    THE PATIENT IS ENCOURAGED TO PRACTICE SOCIAL DISTANCING DUE TO THE COVID-19 PANDEMIC.

## 2019-06-28 ENCOUNTER — Encounter: Payer: Self-pay | Admitting: Nurse Practitioner

## 2019-06-28 ENCOUNTER — Ambulatory Visit: Payer: BC Managed Care – PPO | Admitting: Nurse Practitioner

## 2019-06-28 ENCOUNTER — Other Ambulatory Visit: Payer: Self-pay

## 2019-06-28 VITALS — BP 122/84 | HR 101 | Temp 98.0°F | Ht 72.21 in | Wt 245.0 lb

## 2019-06-28 DIAGNOSIS — L0292 Furuncle, unspecified: Secondary | ICD-10-CM

## 2019-06-28 NOTE — Progress Notes (Signed)
This visit occurred during the SARS-CoV-2 public health emergency.  Safety protocols were in place, including screening questions prior to the visit, additional usage of staff PPE, and extensive cleaning of exam room while observing appropriate contact time as indicated for disinfecting solutions.  Subjective:     Patient ID: [name] [name] , male    DOB: [birth year] , 49 y.o.   MRN: [id number]   Chief Complaint  Patient presents with  . Recurrent Skin Infections    patient presents today for a recheck he stated he thinks the boil has burst     HPI  The boil to his right axilla is slightly open and draining. He is tolerating the antibiotic well.     Past Medical History:  Diagnosis Date  . Anemia    iron def  . Anxiety   . B12 deficiency   . 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 Hemorrhoids   . Hypertension   . Prehypertension 03/09/2013  . Pulmonary embolism (HCC)      Family History  Problem Relation Age of Onset  . Breast cancer Mother   . Diabetes Father   . COPD Father   . Crohn's disease Other   . Colon cancer Neg Hx   . Rectal cancer Neg Hx   . Stomach cancer Neg Hx      Current Outpatient Medications:  .  aspirin EC 81 MG tablet, Take 81 mg by mouth daily., Disp: , Rfl:  .  cephALEXin (KEFLEX) 500 MG capsule, Take 1 capsule (500 mg total) by mouth 4 (four) times daily for 10 days., Disp: 40 capsule, Rfl: 0 .  Ferrous Sulfate (IRON PO), Take 1 tablet by mouth daily at 12 noon. Take 1 68mtablet daily, Disp: , Rfl:  .  metoprolol succinate (TOPROL-XL) 25 MG 24 hr tablet, TAKE 1 TABLET BY MOUTH EVERY DAY, Disp: 90 tablet, Rfl: 1 .  Multiple Vitamin (MULTIVITAMIN WITH MINERALS) TABS tablet, Take 1 tablet by mouth daily., Disp: , Rfl:  .  pantoprazole (PROTONIX) 40 MG tablet, TAKE 1 TABLET BY MOUTH EVERY DAY, Disp: 90 tablet, Rfl: 0 .  Vitamin D, Ergocalciferol, (DRISDOL) 1.25 MG (50000 UT) CAPS capsule, Take 1 capsule (50,000 Units total) by mouth every  7 (seven) days., Disp: 12 capsule, Rfl: 1   No Known Allergies   Review of Systems  Constitutional: Negative for fatigue.  Respiratory: Negative.   Cardiovascular: Negative.      Today's Vitals   06/28/19 1548  BP: 122/84  Pulse: (!) 101  Temp: 98 F (36.7 C)  TempSrc: Oral  Weight: 245 lb (111.1 kg)  Height: 6' 0.21" (1.834 m)  PainSc: 0-No pain   Body mass index is 33.04 kg/m.   Objective:  Physical Exam Vitals reviewed.  Constitutional:      General: He is not in acute distress.    Appearance: Normal appearance.  Pulmonary:     Effort: Pulmonary effort is normal.  Skin:    Comments: Right axilla with firm mass present measuring approximately 4cm x 4 cm with good depth. Small amount of serosanguinous exudate present.   Neurological:     General: No focal deficit present.     Mental Status: He is alert and oriented to person, place, and time.     Cranial Nerves: No cranial nerve deficit.  Psychiatric:        Mood and Affect: Mood normal.        Thought Content: Thought content  normal.        Judgment: Judgment normal.         Assessment And Plan:     1. Boil  Small amount of serosanguinous exudate however the boil continues to be large, firm and has good depth into the skin.  I will refer him to general surgery for evaluation  Cleansed and applied non adherent pad with a gauze.  Advised to keep covered when possible.      [name], FNP    THE PATIENT IS ENCOURAGED TO PRACTICE SOCIAL DISTANCING DUE TO THE COVID-19 PANDEMIC.

## 2019-06-29 ENCOUNTER — Encounter: Payer: Self-pay | Admitting: Nurse Practitioner

## 2019-07-03 ENCOUNTER — Telehealth: Payer: Self-pay

## 2019-07-03 NOTE — Telephone Encounter (Signed)
Patient called stating he had a referral placed for his boil but he has not heard anything yet and he is getting out of town this weekend and he would like to have this taken care of before then.   I returned his call and left him a v/m he can call [facility] and schedule an appointment with them. YL,RMA

## 2019-07-04 ENCOUNTER — Telehealth: Payer: Self-pay

## 2019-07-04 NOTE — Telephone Encounter (Signed)
Patient called stating he had a referral placed for his boil but he has not heard anything yet and he is getting out of town this weekend and he would like to have this taken care of before then.    Patient returned my call and he was advised to call central [place] [facility]. Tyler Deis

## 2019-07-07 ENCOUNTER — Other Ambulatory Visit: Payer: Self-pay | Admitting: Nurse Practitioner

## 2019-08-06 ENCOUNTER — Ambulatory Visit: Payer: BC Managed Care – PPO | Admitting: Nurse Practitioner

## 2019-08-06 ENCOUNTER — Other Ambulatory Visit: Payer: Self-pay

## 2019-08-06 ENCOUNTER — Encounter: Payer: Self-pay | Admitting: Nurse Practitioner

## 2019-08-06 VITALS — BP 124/76 | HR 93 | Temp 98.0°F | Ht 73.0 in | Wt 241.4 lb

## 2019-08-06 DIAGNOSIS — E559 Vitamin D deficiency, unspecified: Secondary | ICD-10-CM

## 2019-08-06 DIAGNOSIS — I10 Essential (primary) hypertension: Secondary | ICD-10-CM | POA: Diagnosis not present

## 2019-08-06 DIAGNOSIS — Z0000 Encounter for general adult medical examination without abnormal findings: Secondary | ICD-10-CM | POA: Diagnosis not present

## 2019-08-06 DIAGNOSIS — Z1159 Encounter for screening for other viral diseases: Secondary | ICD-10-CM | POA: Diagnosis not present

## 2019-08-06 DIAGNOSIS — R7309 Other abnormal glucose: Secondary | ICD-10-CM

## 2019-08-06 DIAGNOSIS — Z125 Encounter for screening for malignant neoplasm of prostate: Secondary | ICD-10-CM | POA: Diagnosis not present

## 2019-08-06 DIAGNOSIS — K5080 Crohn's disease of both small and large intestine without complications: Secondary | ICD-10-CM

## 2019-08-06 LAB — POCT URINALYSIS DIPSTICK
Bilirubin, UA: NEGATIVE
Blood, UA: NEGATIVE
Glucose, UA: NEGATIVE
Ketones, UA: NEGATIVE
Leukocytes, UA: NEGATIVE
Nitrite, UA: NEGATIVE
Protein, UA: NEGATIVE
Spec Grav, UA: 1.025 (ref 1.010–1.025)
Urobilinogen, UA: 0.2 E.U./dL
pH, UA: 6 (ref 5.0–8.0)

## 2019-08-06 LAB — POCT UA - MICROALBUMIN
Albumin/Creatinine Ratio, Urine, POC: 30
Creatinine, POC: 300 mg/dL
Microalbumin Ur, POC: 30 mg/L

## 2019-08-06 NOTE — Progress Notes (Signed)
This visit occurred during the SARS-CoV-2 public health emergency.  Safety protocols were in place, including screening questions prior to the visit, additional usage of staff PPE, and extensive cleaning of exam room while observing appropriate contact time as indicated for disinfecting solutions.  Subjective:     Patient ID: [name] [name] , male    DOB: [birth year] , 49 y.o.   MRN: [id number]   Chief Complaint  Patient presents with  . Annual Exam    HPI  Presents today for his annual HM examination. He states that he went to the surgeon and he recommended that he use he use a topical cleanser and the cyst was small and not needing removal. He is trying to reduce his soda intake and drinks only 16 ounces of water a day. He reports he is on a  regular diet and he is trying to watch his portion sizes.  He is not exercising and he states that he needs to start a routine. He was encouraged to exercise and he states that his mental health and anxiety is better. He is getting good sleep at night. He is getting up urinating frequently but states that he does drink lots of juice.    He is interested in the hepatitis C. He has had the COVID vaccine (moderna) in May.   Wt Readings from Last 3 Encounters: 08/06/19 : (!) 241 lb 6.4 oz (109.5 kg) 06/28/19 : 245 lb (111.1 kg) 06/26/19 : 243 lb 12.8 oz (110.6 kg)    Past Medical History:  Diagnosis Date  . Anemia    iron def  . Anxiety   . B12 deficiency   . 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 Hemorrhoids   . Hypertension   . Prehypertension 03/09/2013  . Pulmonary embolism (HCC)      Family History  Problem Relation Age of Onset  . Breast cancer Mother   . Diabetes Father   . COPD Father   . Crohn's disease Other   . Colon cancer Neg Hx   . Rectal cancer Neg Hx   . Stomach cancer Neg Hx      Current Outpatient Medications:  .  metoprolol succinate (TOPROL-XL) 25 MG 24 hr tablet, TAKE 1 TABLET BY MOUTH EVERY DAY,  Disp: 90 tablet, Rfl: 1 .  Multiple Vitamin (MULTIVITAMIN WITH MINERALS) TABS tablet, Take 1 tablet by mouth daily., Disp: , Rfl:  .  Vitamin D, Ergocalciferol, (DRISDOL) 1.25 MG (50000 UT) CAPS capsule, Take 1 capsule (50,000 Units total) by mouth every 7 (seven) days., Disp: 12 capsule, Rfl: 1 .  aspirin EC 81 MG tablet, Take 81 mg by mouth daily. (Patient not taking: Reported on 08/06/2019), Disp: , Rfl:    No Known Allergies   Men's preventive visit. Patient Health Questionnaire (PHQ-2) is   Patient is on a Regular diet. Marital status: Married. Relevant history for alcohol use is:  Social History   Substance and Sexual Activity  Alcohol Use No   Relevant history for tobacco use is:  Social History   Tobacco Use  Smoking Status Never Smoker  Smokeless Tobacco Never Used  .   Review of Systems  Constitutional: Negative for fatigue.  HENT: Negative.   Eyes: Negative.   Respiratory: Negative.  Negative for cough.   Cardiovascular: Negative.  Negative for chest pain, palpitations and leg swelling.  Gastrointestinal: Negative.   Endocrine: Negative.   Genitourinary: Negative.        Nocturia  Musculoskeletal: Negative.   Skin: Negative.   Allergic/Immunologic: Negative.   Neurological: Negative.   Hematological: Negative.   Psychiatric/Behavioral: Negative.      Today's Vitals   08/06/19 0856  BP: 124/76  Pulse: 93  Temp: 98 F (36.7 C)  TempSrc: Oral  Weight: (!) 241 lb 6.4 oz (109.5 kg)  Height: 6' 1" (1.854 m)  PainSc: 0-No pain   Body mass index is 31.85 kg/m.   Objective:  Physical Exam Vitals reviewed.  Constitutional:      General: He is not in acute distress.    Appearance: Normal appearance. He is obese.  HENT:     Head: Normocephalic and atraumatic.     Right Ear: Tympanic membrane, ear canal and external ear normal. There is no impacted cerumen.     Left Ear: Tympanic membrane, ear canal and external ear normal. There is no impacted cerumen.      Nose:     Comments: Deferred - masked    Mouth/Throat:     Comments: Deferred - masked Eyes:     Extraocular Movements: Extraocular movements intact.     Conjunctiva/sclera: Conjunctivae normal.     Pupils: Pupils are equal, round, and reactive to light.  Cardiovascular:     Rate and Rhythm: Normal rate and regular rhythm.     Pulses: Normal pulses.     Heart sounds: Normal heart sounds. No murmur heard.   Pulmonary:     Effort: Pulmonary effort is normal. No respiratory distress.     Breath sounds: Normal breath sounds. No wheezing.  Abdominal:     General: Abdomen is flat. Bowel sounds are normal. There is no distension.     Palpations: Abdomen is soft.  Musculoskeletal:        General: Normal range of motion.     Cervical back: Normal range of motion and neck supple.  Skin:    General: Skin is warm.     Capillary Refill: Capillary refill takes less than 2 seconds.  Neurological:     General: No focal deficit present.     Mental Status: He is alert and oriented to person, place, and time.  Psychiatric:        Mood and Affect: Mood normal.        Behavior: Behavior normal.        Thought Content: Thought content normal.        Judgment: Judgment normal.         Assessment And Plan:    1. Encounter for annual physical exam . Behavior modifications discussed and diet history reviewed.   . Pt will continue to exercise regularly and modify diet with low GI, plant based foods and decrease intake of processed foods.  . Recommend intake of daily multivitamin, Vitamin D, and calcium.  . Recommend mammogram and colonoscopy for preventive screenings, as well as recommend immunizations that include influenza, TDAP  2. Encounter for hepatitis C screening test for low risk patient  Will check Hepatitis C screening due to recent recommendations to screen all adults 18 years and older - Hepatitis C antibody  3. Encounter for prostate cancer screening  - PSA  4. Essential  hypertension . B/P is controlled.  . CMP ordered to check renal function.  . The importance of regular exercise and dietary modification was stressed to the patient.  . Stressed importance of losing ten percent of her body weight to help with B/P control.  . The weight loss would help with decreasing  cardiac and cancer risk as well.  . EKG done with NSR with HR 87 - CBC - POCT Urinalysis Dipstick (81002) - POCT UA - Microalbumin - EKG 12-Lead  5. Abnormal glucose  Chronic, stable  no current medications  Encouraged to limit intake of sugary foods and drinks  Encouraged to increase physical activity to 150 minutes per week - CBC - Hemoglobin A1c - CMP14+EGFR - Lipid panel  6. Crohn's disease of both small and large intestine without complication ([place])  Continue with follow up with GI  7. Vitamin D deficiency  Will check vitamin D level and supplement as needed.     Also encouraged to spend 15 minutes in the sun daily.  - VITAMIN D 25 Hydroxy (Vit-D Deficiency, Fractures)   Patient was given opportunity to ask questions. Patient verbalized understanding of the plan and was able to repeat key elements of the plan. All questions were answered to their satisfaction.   [name], FNP   I, [name], FNP, have reviewed all documentation for this visit. The documentation on 08/06/19 for the exam, diagnosis, procedures, and orders are all accurate and complete.   THE PATIENT IS ENCOURAGED TO PRACTICE SOCIAL DISTANCING DUE TO THE COVID-19 PANDEMIC.

## 2019-08-06 NOTE — Progress Notes (Deleted)
This visit occurred during the SARS-CoV-2 public health emergency.  Safety protocols were in place, including screening questions prior to the visit, additional usage of staff PPE, and extensive cleaning of exam room while observing appropriate contact time as indicated for disinfecting solutions.  Subjective:     Patient ID: [name] [name] , male    DOB: 10/02/1970 , 49 y.o.   MRN: [id number]   Chief Complaint  Patient presents with  . Annual Exam    HPI  Patient is here for physical exam. He states that he is compliant with medication.     Past Medical History:  Diagnosis Date  . Anemia    iron def  . Anxiety   . B12 deficiency   . 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 Hemorrhoids   . Hypertension   . Prehypertension 03/09/2013  . Pulmonary embolism (HCC)      Family History  Problem Relation Age of Onset  . Breast cancer Mother   . Diabetes Father   . COPD Father   . Crohn's disease Other   . Colon cancer Neg Hx   . Rectal cancer Neg Hx   . Stomach cancer Neg Hx      Current Outpatient Medications:  .  aspirin EC 81 MG tablet, Take 81 mg by mouth daily., Disp: , Rfl:  .  Ferrous Sulfate (IRON PO), Take 1 tablet by mouth daily at 12 noon. Take 1 626mtablet daily, Disp: , Rfl:  .  metoprolol succinate (TOPROL-XL) 25 MG 24 hr tablet, TAKE 1 TABLET BY MOUTH EVERY DAY, Disp: 90 tablet, Rfl: 1 .  Multiple Vitamin (MULTIVITAMIN WITH MINERALS) TABS tablet, Take 1 tablet by mouth daily., Disp: , Rfl:  .  pantoprazole (PROTONIX) 40 MG tablet, TAKE 1 TABLET BY MOUTH EVERY DAY, Disp: 90 tablet, Rfl: 0 .  Vitamin D, Ergocalciferol, (DRISDOL) 1.25 MG (50000 UT) CAPS capsule, Take 1 capsule (50,000 Units total) by mouth every 7 (seven) days., Disp: 12 capsule, Rfl: 1   No Known Allergies   Men's preventive visit. Patient Health Questionnaire ([facility] . Patient is on a *** diet. Marital status: Married. Relevant history for alcohol use is:  Social History    Substance and Sexual Activity  Alcohol Use No  . Relevant history for tobacco use is:  Social History   Tobacco Use  Smoking Status Never Smoker  Smokeless Tobacco Never Used  .   Review of Systems  Constitutional: Negative.   HENT: Negative.   Eyes: Negative.   Respiratory: Negative.   Cardiovascular: Negative.   Gastrointestinal: Negative.   Endocrine: Negative.   Genitourinary: Negative.   Musculoskeletal: Negative.   Skin: Negative.   Allergic/Immunologic: Negative.   Neurological: Negative.   Hematological: Negative.   Psychiatric/Behavioral: Negative.      There were no vitals filed for this visit. There is no height or weight on file to calculate BMI.   Objective:  Physical Exam      Assessment And Plan:    1. Encounter for annual physical exam ***     Patient was given opportunity to ask questions. Patient verbalized understanding of the plan and was able to repeat key elements of the plan. All questions were answered to their satisfaction.   TiOctavio Manns I, TiOctavio Mannshave reviewed all documentation for this visit. The documentation on 08/06/19 for the exam, diagnosis, procedures, and orders are all accurate and complete.  THE PATIENT IS ENCOURAGED TO  PRACTICE SOCIAL DISTANCING DUE TO THE COVID-19 PANDEMIC.

## 2019-08-06 NOTE — Patient Instructions (Signed)

## 2019-08-07 LAB — CMP14+EGFR
ALT: 25 IU/L (ref 0–44)
AST: 20 IU/L (ref 0–40)
Albumin/Globulin Ratio: 1.5 (ref 1.2–2.2)
Albumin: 4.3 g/dL (ref 4.0–5.0)
Alkaline Phosphatase: 67 IU/L (ref 48–121)
BUN/Creatinine Ratio: 8 — ABNORMAL LOW (ref 9–20)
BUN: 11 mg/dL (ref 6–24)
Bilirubin Total: 0.7 mg/dL (ref 0.0–1.2)
CO2: 23 mmol/L (ref 20–29)
Calcium: 9.4 mg/dL (ref 8.7–10.2)
Chloride: 103 mmol/L (ref 96–106)
Creatinine, Ser: 1.36 mg/dL — ABNORMAL HIGH (ref 0.76–1.27)
GFR calc Af Amer: 71 mL/min/{1.73_m2} (ref >59)
GFR calc non Af Amer: 61 mL/min/{1.73_m2} (ref >59)
Globulin, Total: 2.8 g/dL (ref 1.5–4.5)
Glucose: 89 mg/dL (ref 65–99)
Potassium: 4 mmol/L (ref 3.5–5.2)
Sodium: 140 mmol/L (ref 134–144)
Total Protein: 7.1 g/dL (ref 6.0–8.5)

## 2019-08-07 LAB — HEPATITIS C ANTIBODY: Hep C Virus Ab: <0.1 s/co ratio (ref 0.0–0.9)

## 2019-08-07 LAB — CBC
Hematocrit: 44.7 % (ref 37.5–51.0)
Hemoglobin: 14 g/dL (ref 13.0–17.7)
MCH: 26.3 pg — ABNORMAL LOW (ref 26.6–33.0)
MCHC: 31.3 g/dL — ABNORMAL LOW (ref 31.5–35.7)
MCV: 84 fL (ref 79–97)
Platelets: 301 10*3/uL (ref 150–450)
RBC: 5.33 x10E6/uL (ref 4.14–5.80)
RDW: 13.3 % (ref 11.6–15.4)
WBC: 5 10*3/uL (ref 3.4–10.8)

## 2019-08-07 LAB — HEMOGLOBIN A1C
Est. average glucose Bld gHb Est-mCnc: 117 mg/dL
Hgb A1c MFr Bld: 5.7 % — ABNORMAL HIGH (ref 4.8–5.6)

## 2019-08-07 LAB — PSA: Prostate Specific Ag, Serum: 1.2 ng/mL (ref 0.0–4.0)

## 2019-08-07 LAB — LIPID PANEL
Chol/HDL Ratio: 5.2 ratio — ABNORMAL HIGH (ref 0.0–5.0)
Cholesterol, Total: 202 mg/dL — ABNORMAL HIGH (ref 100–199)
HDL: 39 mg/dL — ABNORMAL LOW (ref >39)
LDL Chol Calc (NIH): 144 mg/dL — ABNORMAL HIGH (ref 0–99)
Triglycerides: 103 mg/dL (ref 0–149)
VLDL Cholesterol Cal: 19 mg/dL (ref 5–40)

## 2019-08-07 LAB — VITAMIN D 25 HYDROXY (VIT D DEFICIENCY, FRACTURES): Vit D, 25-Hydroxy: 26.4 ng/mL — ABNORMAL LOW (ref 30.0–100.0)

## 2019-08-28 ENCOUNTER — Other Ambulatory Visit: Payer: Self-pay | Admitting: Nurse Practitioner

## 2019-08-28 DIAGNOSIS — E559 Vitamin D deficiency, unspecified: Secondary | ICD-10-CM

## 2019-11-02 ENCOUNTER — Other Ambulatory Visit: Payer: Self-pay

## 2019-11-02 ENCOUNTER — Ambulatory Visit (INDEPENDENT_AMBULATORY_CARE_PROVIDER_SITE_OTHER): Payer: BC Managed Care – PPO

## 2019-11-02 VITALS — BP 130/76 | HR 96 | Temp 98.1°F | Ht 73.0 in

## 2019-11-02 DIAGNOSIS — Z23 Encounter for immunization: Secondary | ICD-10-CM

## 2019-11-08 ENCOUNTER — Ambulatory Visit: Payer: BC Managed Care – PPO | Admitting: Nurse Practitioner

## 2019-11-08 ENCOUNTER — Other Ambulatory Visit: Payer: Self-pay

## 2019-11-08 ENCOUNTER — Encounter: Payer: Self-pay | Admitting: Nurse Practitioner

## 2019-11-08 VITALS — BP 114/80 | HR 101 | Temp 98.4°F | Ht 69.0 in | Wt 245.0 lb

## 2019-11-08 DIAGNOSIS — S8991XD Unspecified injury of right lower leg, subsequent encounter: Secondary | ICD-10-CM

## 2019-11-08 DIAGNOSIS — S8992XD Unspecified injury of left lower leg, subsequent encounter: Secondary | ICD-10-CM

## 2019-11-08 NOTE — Progress Notes (Signed)
I,Yamilka Roman [employer] as a Education administrator for [employer], FNP.,have documented all relevant documentation on the behalf of [name], FNP,as directed by  [name], FNP while in the presence of [name], [place]. This visit occurred during the SARS-CoV-2 public health emergency.  Safety protocols were in place, including screening questions prior to the visit, additional usage of staff PPE, and extensive cleaning of exam room while observing appropriate contact time as indicated for disinfecting solutions.  Subjective:     Patient ID: [name] [name] , male    DOB: 1970/03/09 , 49 y.o.   MRN: [id number]   Chief Complaint  Patient presents with  . Leg Pain    patient would like to have his handicap placard renewed.     HPI  Here today requesting a new placard for his car due to limited range of motion to his left knee. His previous placard was done by Dr. Marcelino [name] who did his surgery.  He suffered a traumatic injury in 2015 and has been having difficulty with ambulating after sitting for long periods.  He is only able to move his right leg within 60 degrees and has a noted severe left knee contracture    Past Medical History:  Diagnosis Date  . Anemia    iron def  . Anxiety   . B12 deficiency   . 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 Hemorrhoids   . Hypertension   . Prehypertension 03/09/2013  . Pulmonary embolism ([facility])      Family History  Problem Relation Age of Onset  . Breast cancer Mother   . Diabetes Father   . COPD Father   . Crohn's disease Other   . Colon cancer Neg Hx   . Rectal cancer Neg Hx   . Stomach cancer Neg Hx      Current Outpatient Medications:  .  metoprolol succinate (TOPROL-XL) 25 MG 24 hr tablet, TAKE 1 TABLET BY MOUTH EVERY DAY, Disp: 90 tablet, Rfl: 1 .  Multiple Vitamin (MULTIVITAMIN WITH MINERALS) TABS tablet, Take 1 tablet by mouth daily., Disp: , Rfl:  .  Vitamin D, Ergocalciferol, (DRISDOL) 1.25 MG (50000 UNIT) CAPS capsule,  TAKE 1 CAPSULE (50,000 UNITS TOTAL) BY MOUTH EVERY 7 DAYS, Disp: 12 capsule, Rfl: 1 .  aspirin EC 81 MG tablet, Take 81 mg by mouth daily. (Patient not taking: Reported on 08/06/2019), Disp: , Rfl:    No Known Allergies   Review of Systems  Constitutional: Negative.   Respiratory: Negative.   Cardiovascular: Negative.  Negative for chest pain, palpitations and leg swelling.  Musculoskeletal: Negative for myalgias.       Left knee has limited range of motion.    Neurological: Negative for headaches.  Psychiatric/Behavioral: Negative.      Today's Vitals   11/08/19 1141  BP: 114/80  Pulse: (!) 101  Temp: 98.4 F (36.9 C)  TempSrc: Oral  Weight: 245 lb (111.1 kg)  Height: 5' 9"  (1.753 m)  PainSc: 0-No pain   Body mass index is 36.18 kg/m.   Objective:  Physical Exam Constitutional:      General: He is not in acute distress.    Appearance: He is obese.  Pulmonary:     Effort: Pulmonary effort is normal. No respiratory distress.  Musculoskeletal:        General: Signs of injury (2015 which has decreased his mobility ) present. No swelling or tenderness.     Right lower leg: No edema.  Left lower leg: No edema.     Comments: Limited range of motion. Knee is contracted   Skin:    General: Skin is warm.     Capillary Refill: Capillary refill takes less than 2 seconds.  Neurological:     General: No focal deficit present.     Mental Status: He is alert and oriented to person, place, and time.     Cranial Nerves: No cranial nerve deficit.  Psychiatric:        Mood and Affect: Mood normal.        Behavior: Behavior normal.        Thought Content: Thought content normal.        Judgment: Judgment normal.         Assessment And Plan:     1. Injury of right knee, subsequent encounter  Suffered a traumatic injury in 2015 that has caused him to be unable to bend his knee more than 60 degrees, this causes him discomfort and difficulty with ambulating when sitting for long  periods and driving.  I have renewed his handicap placard but have advised him to try to move more to help this to not get worse.      Patient was given opportunity to ask questions. Patient verbalized understanding of the plan and was able to repeat key elements of the plan. All questions were answered to their satisfaction.   Teola Bradley, FNP, have reviewed all documentation for this visit. The documentation on 11/08/19 for the exam, diagnosis, procedures, and orders are all accurate and complete.  THE PATIENT IS ENCOURAGED TO PRACTICE SOCIAL DISTANCING DUE TO THE COVID-19 PANDEMIC.

## 2019-11-08 NOTE — Patient Instructions (Signed)
I strongly encourage you to move regularly to strengthen your right knee

## 2019-11-09 ENCOUNTER — Encounter: Payer: Self-pay | Admitting: Nurse Practitioner

## 2019-12-11 ENCOUNTER — Other Ambulatory Visit: Payer: Self-pay | Admitting: Nurse Practitioner

## 2019-12-25 ENCOUNTER — Ambulatory Visit: Payer: BC Managed Care – PPO | Attending: Internal Medicine

## 2019-12-25 DIAGNOSIS — Z23 Encounter for immunization: Secondary | ICD-10-CM

## 2019-12-25 NOTE — Progress Notes (Signed)
   Covid-19 Vaccination Clinic  Name:  [name] [name]    MRN: [id number] DOB: 04-19-1970  12/25/2019  [name] [name] was observed post Covid-19 immunization for 15 minutes without incident. He was provided with Vaccine Information Sheet and instruction to access the V-Safe system.   [name] [name] was instructed to call 911 with any severe reactions post vaccine: Marland Kitchen Difficulty breathing  . Swelling of face and throat  . A fast heartbeat  . A bad rash all over body  . Dizziness and weakness   Immunizations Administered    Name Date Dose VIS Date Route   Moderna Covid-19 Booster Vaccine 12/25/2019  2:44 PM 0.25 mL 10/24/2019 Intramuscular   Manufacturer: Moderna   Lot: [id number]   [place]: [id number]

## 2020-02-06 ENCOUNTER — Encounter: Payer: Self-pay | Admitting: Nurse Practitioner

## 2020-02-06 ENCOUNTER — Other Ambulatory Visit: Payer: Self-pay | Admitting: Nurse Practitioner

## 2020-02-06 ENCOUNTER — Ambulatory Visit: Payer: BC Managed Care – PPO | Admitting: Nurse Practitioner

## 2020-02-06 ENCOUNTER — Other Ambulatory Visit: Payer: Self-pay

## 2020-02-06 VITALS — BP 124/86 | HR 86 | Temp 98.2°F | Ht 69.0 in | Wt 250.0 lb

## 2020-02-06 DIAGNOSIS — E559 Vitamin D deficiency, unspecified: Secondary | ICD-10-CM

## 2020-02-06 DIAGNOSIS — I10 Essential (primary) hypertension: Secondary | ICD-10-CM

## 2020-02-06 DIAGNOSIS — E7800 Pure hypercholesterolemia, unspecified: Secondary | ICD-10-CM | POA: Diagnosis not present

## 2020-02-06 DIAGNOSIS — R7309 Other abnormal glucose: Secondary | ICD-10-CM | POA: Diagnosis not present

## 2020-02-06 DIAGNOSIS — K219 Gastro-esophageal reflux disease without esophagitis: Secondary | ICD-10-CM

## 2020-02-06 MED ORDER — OMEPRAZOLE 20 MG PO CPDR: 20.0000 mg | DELAYED_RELEASE_CAPSULE | Freq: Every day | ORAL | 1 refills | Status: DC

## 2020-02-06 MED ORDER — PANTOPRAZOLE SODIUM 40 MG PO TBEC: 40.0000 mg | DELAYED_RELEASE_TABLET | Freq: Every day | ORAL | 1 refills | Status: DC

## 2020-02-06 NOTE — Patient Instructions (Signed)

## 2020-02-06 NOTE — Progress Notes (Signed)
I,Yamilka Roman [employer] as a Education administrator for [employer], FNP.,have documented all relevant documentation on the behalf of [name], FNP,as directed by  [name], FNP while in the presence of [name], [place]. This visit occurred during the SARS-CoV-2 public health emergency.  Safety protocols were in place, including screening questions prior to the visit, additional usage of staff PPE, and extensive cleaning of exam room while observing appropriate contact time as indicated for disinfecting solutions.  Subjective:     Patient ID: [name] [name] , male    DOB: 10-22-1970 , 50 y.o.   MRN: [id number]   Chief Complaint  Patient presents with  . Hypertension  . Gastroesophageal Reflux    Patient would like a refill on pantoprazole    HPI  Patient here for a f/u on his blood pressure.   Wt Readings from Last 3 Encounters: 02/06/20 : 250 lb (113.4 kg) 11/08/19 : 245 lb (111.1 kg) 08/06/19 : (!) 241 lb 6.4 oz (109.5 kg)   Hypertension This is a chronic problem. The current episode started more than 1 year ago. The problem is unchanged. The problem is controlled. Pertinent negatives include no anxiety. There are no associated agents to hypertension. Risk factors for coronary artery disease include obesity and sedentary lifestyle. Past treatments include beta blockers. The current treatment provides no improvement. There are no compliance problems.  There is no history of angina. There is no history of chronic renal disease.     Past Medical History:  Diagnosis Date  . Anemia    iron def  . Anxiety   . B12 deficiency   . Crohn's ileocolitis ([place])    66m since 2007  . Hemorrhoids   . Hypertension   . Prehypertension 03/09/2013  . Pulmonary embolism ([facility])      Family History  Problem Relation Age of Onset  . Breast cancer Mother   . Diabetes Father   . COPD Father   . Crohn's disease Other   . Colon cancer Neg Hx   . Rectal cancer Neg Hx   . Stomach cancer Neg Hx       Current Outpatient Medications:  .  aspirin EC 81 MG tablet, Take 81 mg by mouth daily., Disp: , Rfl:  .  Multiple Vitamin (MULTIVITAMIN WITH MINERALS) TABS tablet, Take 1 tablet by mouth daily., Disp: , Rfl:  .  omeprazole (PRILOSEC) 20 MG capsule, Take 1 capsule (20 mg total) by mouth daily., Disp: 30 capsule, Rfl: 1 .  metoprolol succinate (TOPROL-XL) 25 MG 24 hr tablet, TAKE 1 TABLET BY MOUTH EVERY DAY, Disp: 90 tablet, Rfl: 1 .  pantoprazole (PROTONIX) 40 MG tablet, Take 1 tablet (40 mg total) by mouth daily., Disp: 90 tablet, Rfl: 1 .  Vitamin D, Ergocalciferol, (DRISDOL) 1.25 MG (50000 UNIT) CAPS capsule, TAKE 1 CAPSULE (50,000 UNITS TOTAL) BY MOUTH EVERY 7 DAYS, Disp: 12 capsule, Rfl: 1   No Known Allergies   Review of Systems  Constitutional: Negative.   HENT: Negative.   Eyes: Negative.   Respiratory: Negative.   Cardiovascular: Negative.   Gastrointestinal: Negative.   Endocrine: Negative.   Genitourinary: Negative.   Musculoskeletal: Negative.   Skin: Negative.   Neurological: Negative.   Hematological: Negative.   Psychiatric/Behavioral: Negative.      Today's Vitals   02/06/20 0855  BP: 124/86  Pulse: 86  Temp: 98.2 F (36.8 C)  TempSrc: Oral  Weight: 250 lb (113.4 kg)  Height: 5' 9"  (1.753 m)  PainSc: 0-No  pain   Body mass index is 36.92 kg/m.   Objective:  Physical Exam Vitals reviewed.  Constitutional:      General: He is not in acute distress.    Appearance: Normal appearance. He is obese.  Cardiovascular:     Rate and Rhythm: Normal rate.     Pulses: Normal pulses.     Heart sounds: Normal heart sounds. No murmur heard.   Pulmonary:     Effort: Pulmonary effort is normal. No respiratory distress.     Breath sounds: Normal breath sounds.  Skin:    Capillary Refill: Capillary refill takes less than 2 seconds.  Neurological:     General: No focal deficit present.     Mental Status: He is alert and oriented to person, place, and time.      Cranial Nerves: No cranial nerve deficit.  Psychiatric:        Mood and Affect: Mood normal.        Behavior: Behavior normal.        Thought Content: Thought content normal.        Judgment: Judgment normal.         Assessment And Plan:     1. Essential hypertension  Chronic, blood pressure is fairly controlled  He is encouraged again to make sure he is drinking adequate amounts of water - BMP8+eGFR  2. Gastroesophageal reflux disease without esophagitis  Chronic, reoccurring  Will refill his pantoprazole  He is also encouraged to avoid high fat and grease containing foods - pantoprazole (PROTONIX) 40 MG tablet; Take 1 tablet (40 mg total) by mouth daily.  Dispense: 90 tablet; Refill: 1  3. Abnormal glucose  Chronic, good control  Avoid sugary foods and drinks  4. Vitamin D deficiency  Will check vitamin D level and supplement as needed.     Also encouraged to spend 15 minutes in the sun daily.  - Vitamin D (25 hydroxy)  5. Hypercholesteremia  Chronic, elevated at last visit  He is encouraged to increase his physical activity to help with his good cholesterol  He is also to avoid fatty foods  - Lipid panel     Patient was given opportunity to ask questions. Patient verbalized understanding of the plan and was able to repeat key elements of the plan. All questions were answered to their satisfaction.  [name], FNP   I, [name], FNP, have reviewed all documentation for this visit. The documentation on 02/06/20 for the exam, diagnosis, procedures, and orders are all accurate and complete .  THE PATIENT IS ENCOURAGED TO PRACTICE SOCIAL DISTANCING DUE TO THE COVID-19 PANDEMIC.

## 2020-02-07 LAB — LIPID PANEL
Chol/HDL Ratio: 4.4 ratio (ref 0.0–5.0)
Cholesterol, Total: 182 mg/dL (ref 100–199)
HDL: 41 mg/dL (ref >39)
LDL Chol Calc (NIH): 125 mg/dL — ABNORMAL HIGH (ref 0–99)
Triglycerides: 86 mg/dL (ref 0–149)
VLDL Cholesterol Cal: 16 mg/dL (ref 5–40)

## 2020-02-07 LAB — BMP8+EGFR
BUN/Creatinine Ratio: 7 — ABNORMAL LOW (ref 9–20)
BUN: 9 mg/dL (ref 6–24)
CO2: 26 mmol/L (ref 20–29)
Calcium: 9.3 mg/dL (ref 8.7–10.2)
Chloride: 104 mmol/L (ref 96–106)
Creatinine, Ser: 1.31 mg/dL — ABNORMAL HIGH (ref 0.76–1.27)
GFR calc Af Amer: 74 mL/min/{1.73_m2} (ref >59)
GFR calc non Af Amer: 64 mL/min/{1.73_m2} (ref >59)
Glucose: 85 mg/dL (ref 65–99)
Potassium: 4.4 mmol/L (ref 3.5–5.2)
Sodium: 141 mmol/L (ref 134–144)

## 2020-02-07 LAB — VITAMIN D 25 HYDROXY (VIT D DEFICIENCY, FRACTURES): Vit D, 25-Hydroxy: 37.1 ng/mL (ref 30.0–100.0)

## 2020-02-13 ENCOUNTER — Other Ambulatory Visit: Payer: Self-pay | Admitting: Nurse Practitioner

## 2020-02-13 DIAGNOSIS — E559 Vitamin D deficiency, unspecified: Secondary | ICD-10-CM

## 2020-02-29 IMAGING — US US SOFT TISSUE EXCLUDE HEAD/NECK
1 series · 14 of 25 positions shown · non-contrast
Comparison: Chest CT dated 07/05/2013

CLINICAL DATA: Chest wall mass.

EXAM:
ULTRASOUND OF HEAD/NECK SOFT TISSUES
TECHNIQUE: Ultrasound examination of the head and neck soft tissues was
performed in the area of clinical concern.

[Series 1: us soft tissue exclude head/neck · 0.05mm/px · 43 acquisitions, 14 frames shown]
[im 1/43]
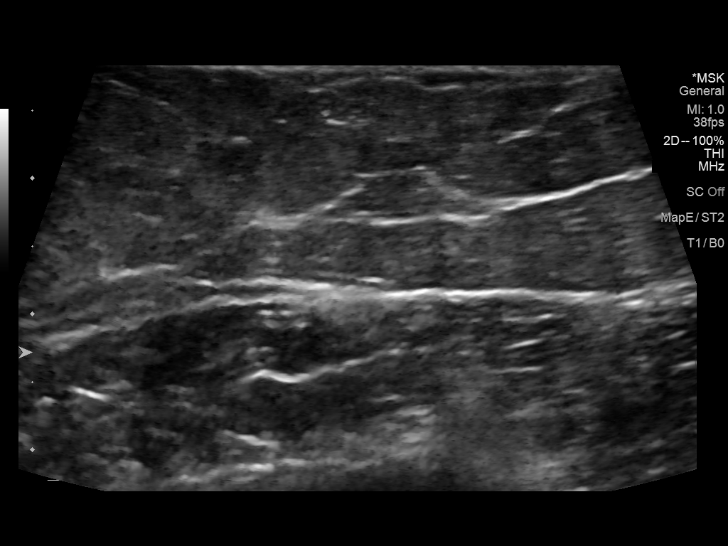
[im 4/43]
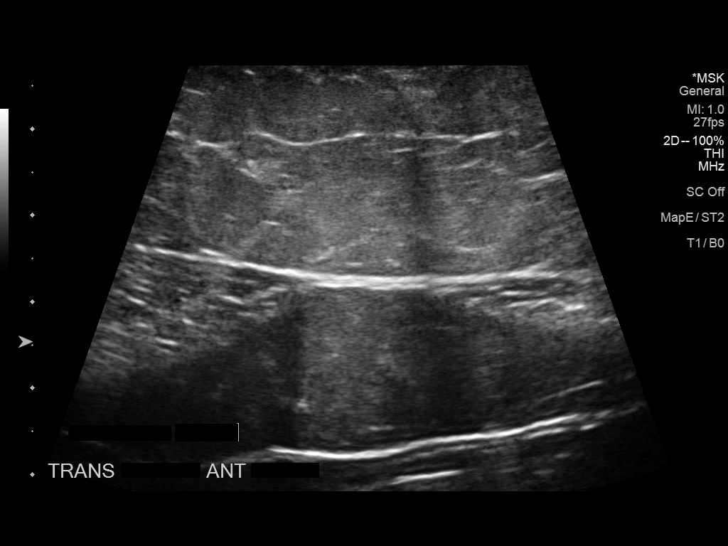
[im 8/43]
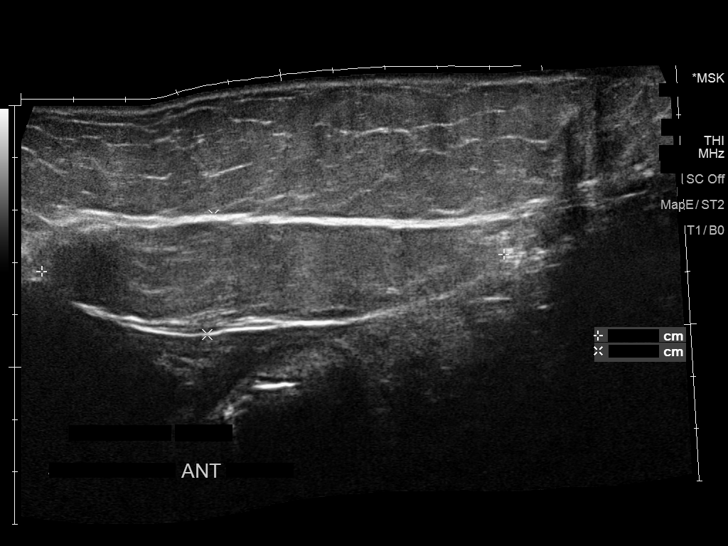
[im 11/43]
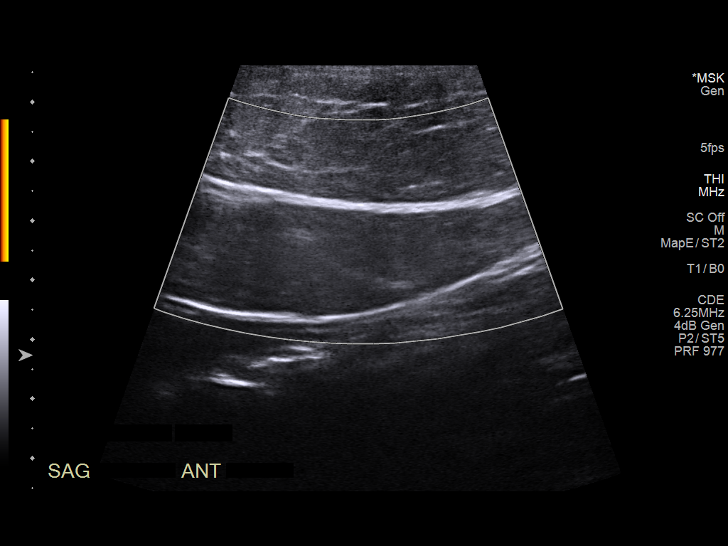
[im 15/43]
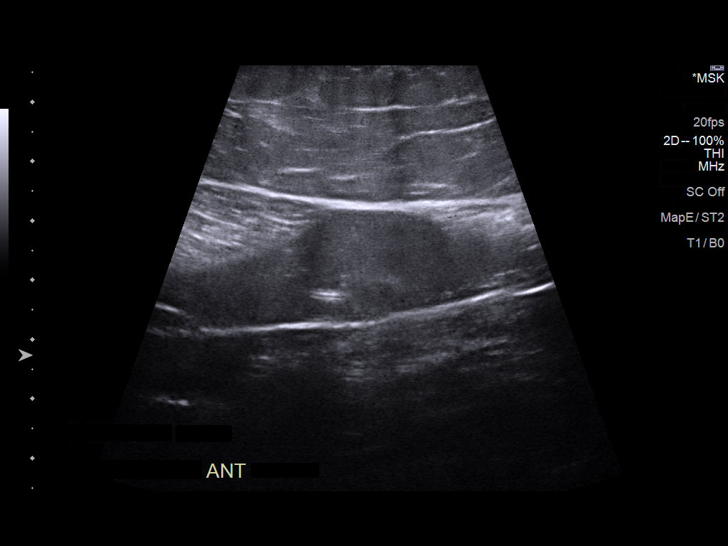
[im 16/43]
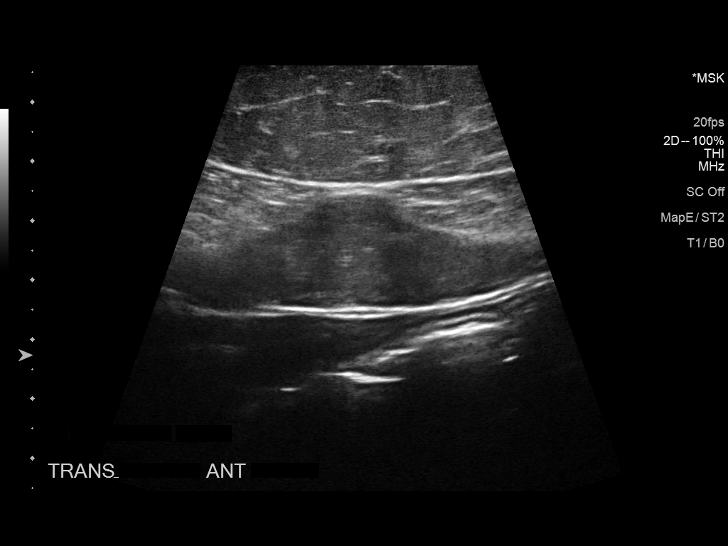
[im 20/43]
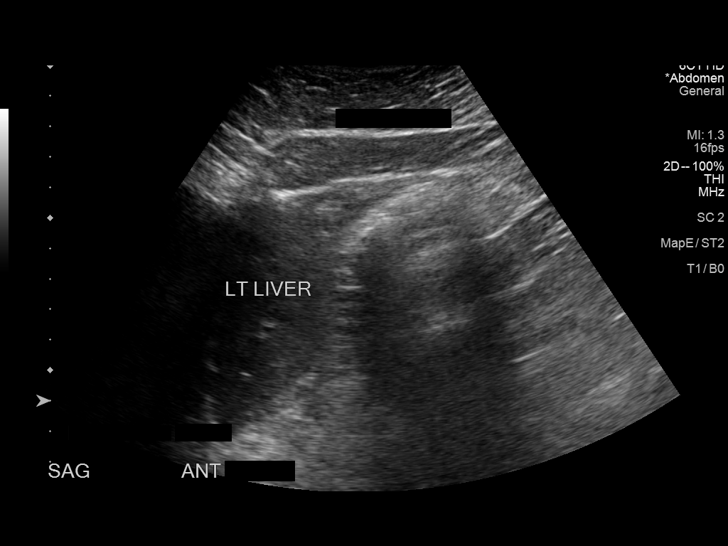
[im 23/43]
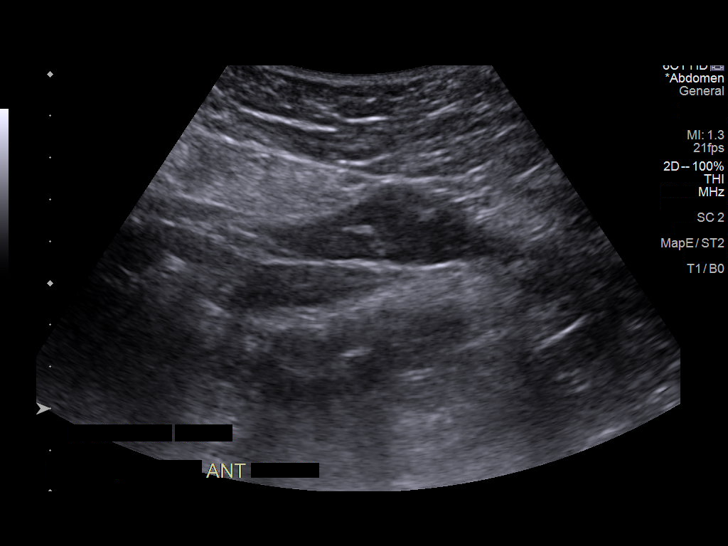
[im 27/43]
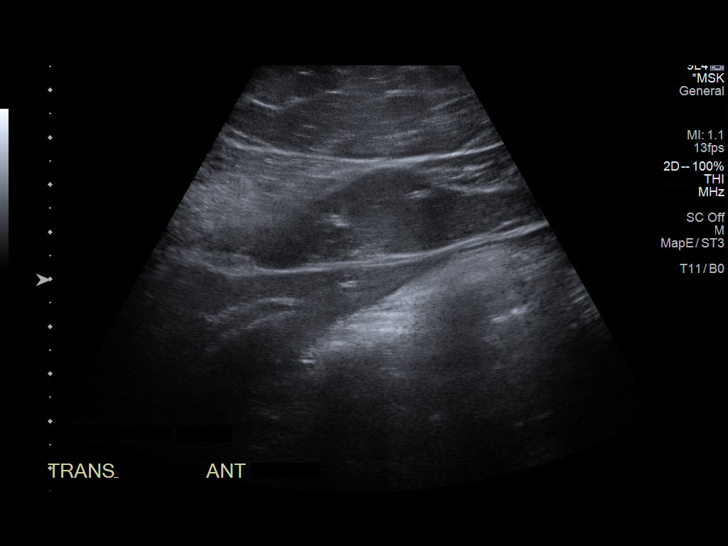
[im 29/43]
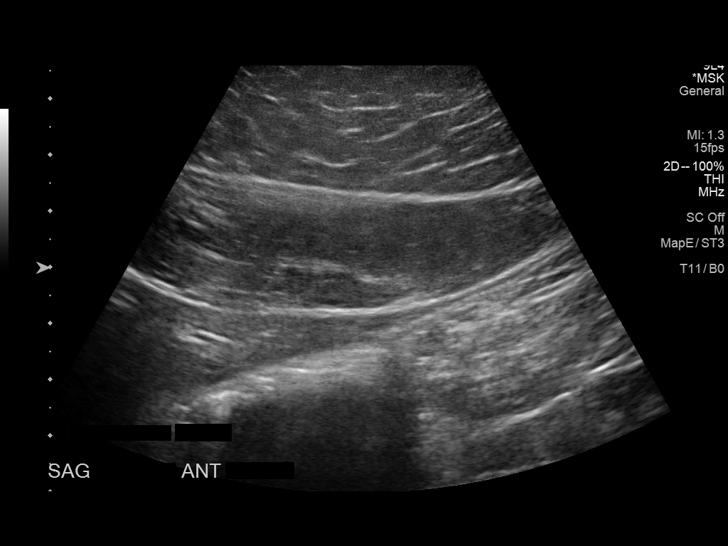
[im 32/43]
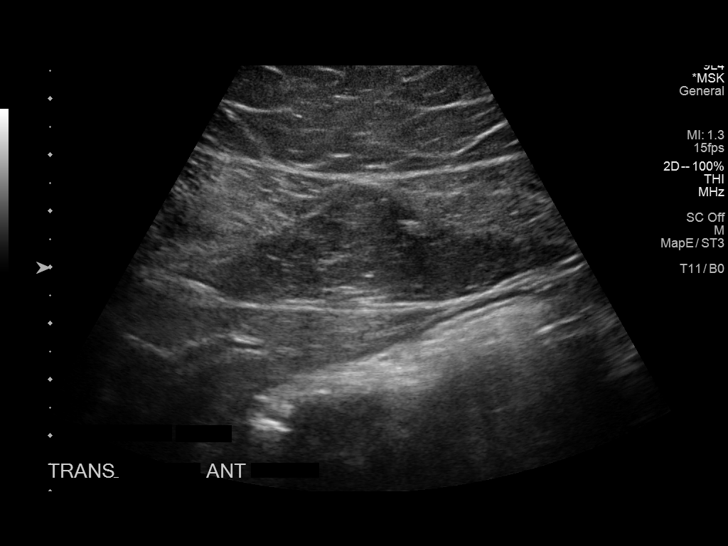
[im 36/43]
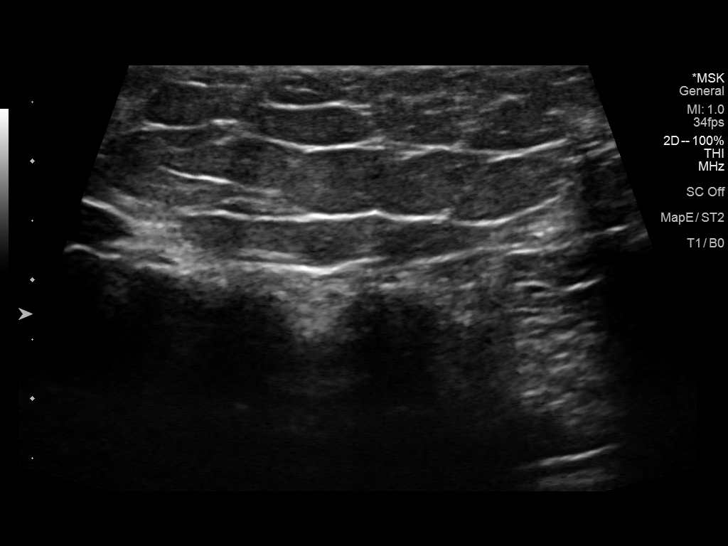
[im 39/43]
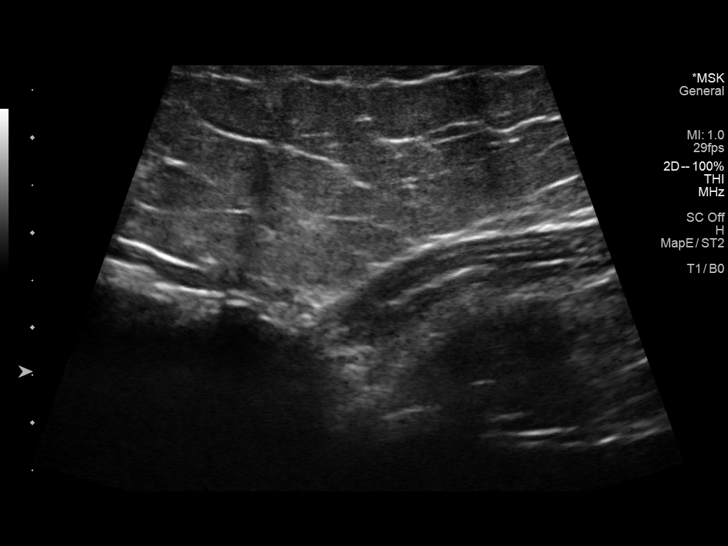
[im 43/43]
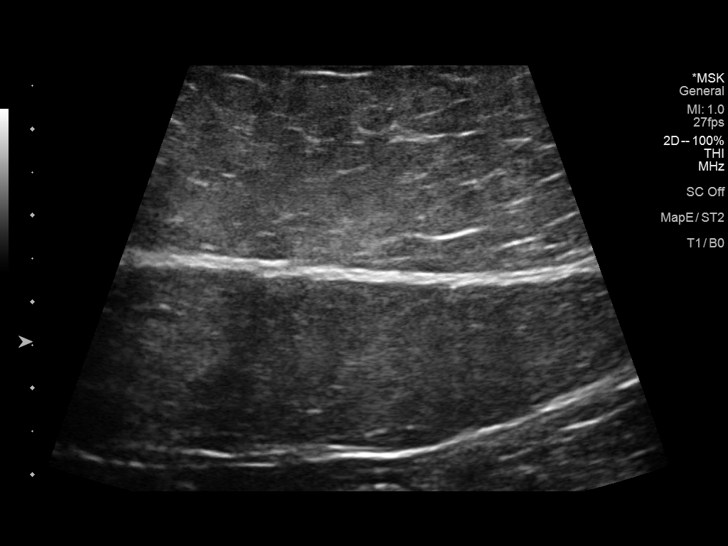

[14 of 25 positions shown; findings below may reference images not displayed]

FINDINGS: When compared with the prior chest CT, there is no definable mass in
the area of concern. There is fat superficial and deep to the
proximal rectus abdominus muscles. There is prominent subcutaneous
fat overlying the proximal rectus abdominal muscles as well as
prominent intra-abdominal fat deep to the proximal [place] but
this is similar to the appearance on the prior chest CT dated
07/05/2013.
IMPRESSION: 1. No definable mass in the area of concern in the area of concern.
2. Prominent subcutaneous fat overlying the proximal rectus
abdominus muscles as well as prominent intra-abdominal fat deep to
the proximal [place].

## 2020-05-06 ENCOUNTER — Encounter: Payer: Self-pay | Admitting: Nurse Practitioner

## 2020-05-06 ENCOUNTER — Ambulatory Visit: Payer: BC Managed Care – PPO | Admitting: Nurse Practitioner

## 2020-05-06 ENCOUNTER — Other Ambulatory Visit: Payer: Self-pay

## 2020-05-06 VITALS — BP 114/78 | HR 84 | Temp 98.1°F | Ht 69.8 in | Wt 250.2 lb

## 2020-05-06 DIAGNOSIS — I10 Essential (primary) hypertension: Secondary | ICD-10-CM | POA: Diagnosis not present

## 2020-05-06 DIAGNOSIS — M79671 Pain in right foot: Secondary | ICD-10-CM

## 2020-05-06 DIAGNOSIS — R202 Paresthesia of skin: Secondary | ICD-10-CM | POA: Diagnosis not present

## 2020-05-06 DIAGNOSIS — E669 Obesity, unspecified: Secondary | ICD-10-CM

## 2020-05-06 DIAGNOSIS — Z6836 Body mass index (BMI) 36.0-36.9, adult: Secondary | ICD-10-CM

## 2020-05-06 NOTE — Patient Instructions (Addendum)
Hypertension, Adult Hypertension is another name for high blood pressure. High blood pressure forces your heart to work harder to pump blood. This can cause problems over time. There are two numbers in a blood pressure reading. There is a top number (systolic) over a bottom number (diastolic). It is best to have a blood pressure that is below 120/80. Healthy choices can help lower your blood pressure, or you may need medicine to help lower it. What are the causes? The cause of this condition is not known. Some conditions may be related to high blood pressure. What increases the risk?  Smoking.  Having type 2 diabetes mellitus, high cholesterol, or both.  Not getting enough exercise or physical activity.  Being overweight.  Having too much fat, sugar, calories, or salt (sodium) in your diet.  Drinking too much alcohol.  Having long-term (chronic) kidney disease.  Having a family history of high blood pressure.  Age. Risk increases with age.  Race. You may be at higher risk if you are African American.  Gender. Men are at higher risk than women before age 43. After age 18, women are at higher risk than men.  Having obstructive sleep apnea.  Stress. What are the signs or symptoms?  High blood pressure may not cause symptoms. Very high blood pressure (hypertensive crisis) may cause: ? Headache. ? Feelings of worry or nervousness (anxiety). ? Shortness of breath. ? Nosebleed. ? A feeling of being sick to your stomach (nausea). ? Throwing up (vomiting). ? Changes in how you see. ? Very bad chest pain. ? Seizures. How is this treated?  This condition is treated by making healthy lifestyle changes, such as: ? Eating healthy foods. ? Exercising more. ? Drinking less alcohol.  Your health care provider may prescribe medicine if lifestyle changes are not enough to get your blood pressure under control, and if: ? Your top number is above 130. ? Your bottom number is above  80.  Your personal target blood pressure may vary. Follow these instructions at home: Eating and drinking  If told, follow the DASH eating plan. To follow this plan: ? Fill one half of your plate at each meal with fruits and vegetables. ? Fill one fourth of your plate at each meal with whole grains. Whole grains include whole-wheat pasta, brown rice, and whole-grain bread. ? Eat or drink low-fat dairy products, such as skim milk or low-fat yogurt. ? Fill one fourth of your plate at each meal with low-fat (lean) proteins. Low-fat proteins include fish, chicken without skin, eggs, beans, and tofu. ? Avoid fatty meat, cured and processed meat, or chicken with skin. ? Avoid pre-made or processed food.  Eat less than 1,500 mg of salt each day.  Do not drink alcohol if: ? Your doctor tells you not to drink. ? You are pregnant, may be pregnant, or are planning to become pregnant.  If you drink alcohol: ? Limit how much you use to:  0-1 drink a day for women.  0-2 drinks a day for men. ? Be aware of how much alcohol is in your drink. In the [place] one drink equals one 12 oz bottle of beer (355 mL), one 5 oz glass of wine (148 mL), or one 1 oz glass of hard liquor (44 mL).   Lifestyle  Work with your doctor to stay at a healthy weight or to lose weight. Ask your doctor what the best weight is for you.  Get at least 30 minutes of exercise most  days of the week. This may include walking, swimming, or biking.  Get at least 30 minutes of exercise that strengthens your muscles (resistance exercise) at least 3 days a week. This may include lifting weights or doing Pilates.  Do not use any products that contain nicotine or tobacco, such as cigarettes, e-cigarettes, and chewing tobacco. If you need help quitting, ask your doctor.  Check your blood pressure at home as told by your doctor.  Keep all follow-up visits as told by your doctor. This is important.   Medicines  Take over-the-counter  and prescription medicines only as told by your doctor. Follow directions carefully.  Do not skip doses of blood pressure medicine. The medicine does not work as well if you skip doses. Skipping doses also puts you at risk for problems.  Ask your doctor about side effects or reactions to medicines that you should watch for. Contact a doctor if you:  Think you are having a reaction to the medicine you are taking.  Have headaches that keep coming back (recurring).  Feel dizzy.  Have swelling in your ankles.  Have trouble with your vision. Get help right away if you:  Get a very bad headache.  Start to feel mixed up (confused).  Feel weak or numb.  Feel faint.  Have very bad pain in your: ? Chest. ? Belly (abdomen).  Throw up more than once.  Have trouble breathing. Summary  Hypertension is another name for high blood pressure.  High blood pressure forces your heart to work harder to pump blood.  For most people, a normal blood pressure is less than 120/80.  Making healthy choices can help lower blood pressure. If your blood pressure does not get lower with healthy choices, you may need to take medicine. This information is not intended to replace advice given to you by your health care provider. Make sure you discuss any questions you have with your health care provider. Document Revised: 08/31/2017 Document Reviewed: 08/31/2017 Elsevier Patient Education  2021 [place].    Plantar Fasciitis  Plantar fasciitis is a painful foot condition that affects the heel. It occurs when the band of tissue that connects the toes to the heel bone (plantar fascia) becomes irritated. This can happen as the result of exercising too much or doing other repetitive activities (overuse injury). Plantar fasciitis can cause mild irritation to severe pain that makes it difficult to walk or move. The pain is usually worse in the morning after sleeping, or after sitting or lying down for  a period of time. Pain may also be worse after long periods of walking or standing. What are the causes? This condition may be caused by:  Standing for long periods of time.  Wearing shoes that do not have good arch support.  Doing activities that put stress on joints (high-impact activities). This includes ballet and exercise that makes your heart beat faster (aerobic exercise), such as running.  Being overweight.  An abnormal way of walking (gait).  Tight muscles in the back of your lower leg (calf).  High arches in your feet or flat feet.  Starting a new athletic activity. What are the signs or symptoms? The main symptom of this condition is heel pain. Pain may get worse after the following:  Taking the first steps after a time of rest, especially in the morning after awakening, or after you have been sitting or lying down for a while.  Long periods of standing still. Pain may decrease after  30-45 minutes of activity, such as gentle walking. How is this diagnosed? This condition may be diagnosed based on your medical history, a physical exam, and your symptoms. Your health care provider will check for:  A tender area on the bottom of your foot.  A high arch in your foot or flat feet.  Pain when you move your foot.  Difficulty moving your foot. You may have imaging tests to confirm the diagnosis, such as:  X-rays.  Ultrasound.  MRI. How is this treated? Treatment for plantar fasciitis depends on how severe your condition is. Treatment may include:  Rest, ice, pressure (compression), and raising (elevating) the affected foot. This is called RICE therapy. Your health care provider may recommend RICE therapy along with over-the-counter pain medicines to manage your pain.  Exercises to stretch your calves and your plantar fascia.  A splint that holds your foot in a stretched, upward position while you sleep (night splint).  Physical therapy to relieve symptoms and  prevent problems in the future.  Injections of steroid medicine (cortisone) to relieve pain and inflammation.  Stimulating your plantar fascia with electrical impulses (extracorporeal shock wave therapy). This is usually the last treatment option before surgery.  Surgery, if other treatments have not worked after 12 months. Follow these instructions at home: Managing pain, stiffness, and swelling  If directed, put ice on the painful area. To do this: ? Put ice in a plastic bag, or use a frozen bottle of water. ? Place a towel between your skin and the bag or bottle. ? Roll the bottom of your foot over the bag or bottle. ? Do this for 20 minutes, 2-3 times a day.  Wear athletic shoes that have air-sole or gel-sole cushions, or try soft shoe inserts that are designed for plantar fasciitis.  Elevate your foot above the level of your heart while you are sitting or lying down.   Activity  Avoid activities that cause pain. Ask your health care provider what activities are safe for you.  Do physical therapy exercises and stretches as told by your health care provider.  Try activities and forms of exercise that are easier on your joints (low impact). Examples include swimming, water aerobics, and biking. General instructions  Take over-the-counter and prescription medicines only as told by your health care provider.  Wear a night splint while sleeping, if told by your health care provider. Loosen the splint if your toes tingle, become numb, or turn cold and blue.  Maintain a healthy weight, or work with your health care provider to lose weight as needed.  Keep all follow-up visits. This is important. Contact a health care provider if you have:  Symptoms that do not go away with home treatment.  Pain that gets worse.  Pain that affects your ability to move or do daily activities. Summary  Plantar fasciitis is a painful foot condition that affects the heel. It occurs when the band  of tissue that connects the toes to the heel bone (plantar fascia) becomes irritated.  Heel pain is the main symptom of this condition. It may get worse after exercising too much or standing still for a long time.  Treatment varies, but it usually starts with rest, ice, pressure (compression), and raising (elevating) the affected foot. This is called RICE therapy. Over-the-counter medicines can also be used to manage pain. This information is not intended to replace advice given to you by your health care provider. Make sure you discuss any questions you have  with your health care provider. Document Revised: 04/09/2019 Document Reviewed: 04/09/2019 Elsevier Patient Education  2021 [place].  Exercising to Lose Weight Exercise is structured, repetitive physical activity to improve fitness and health. Getting regular exercise is important for everyone. It is especially important if you are overweight. Being overweight increases your risk of heart disease, stroke, diabetes, high blood pressure, and several types of cancer. Reducing your calorie intake and exercising can help you lose weight. Exercise is usually categorized as moderate or vigorous intensity. To lose weight, most people need to do a certain amount of moderate-intensity or vigorous-intensity exercise each week. Moderate-intensity exercise Moderate-intensity exercise is any activity that gets you moving enough to burn at least three times more energy (calories) than if you were sitting. Examples of moderate exercise include:  Walking a mile in 15 minutes.  Doing light yard work.  Biking at an easy pace. Most people should get at least 150 minutes (2 hours and 30 minutes) a week of moderate-intensity exercise to maintain their body weight.   Vigorous-intensity exercise Vigorous-intensity exercise is any activity that gets you moving enough to burn at least six times more calories than if you were sitting. When you exercise at  this intensity, you should be working hard enough that you are not able to carry on a conversation. Examples of vigorous exercise include:  Running.  Playing a team sport, such as football, basketball, and soccer.  Jumping rope. Most people should get at least 75 minutes (1 hour and 15 minutes) a week of vigorous-intensity exercise to maintain their body weight. How can exercise affect me? When you exercise enough to burn more calories than you eat, you lose weight. Exercise also reduces body fat and builds muscle. The more muscle you have, the more calories you burn. Exercise also:  Improves mood.  Reduces stress and tension.  Improves your overall fitness, flexibility, and endurance.  Increases bone strength. The amount of exercise you need to lose weight depends on:  Your age.  The type of exercise.  Any health conditions you have.  Your overall physical ability. Talk to your health care provider about how much exercise you need and what types of activities are safe for you. What actions can I take to lose weight? Nutrition  Make changes to your diet as told by your health care provider or diet and nutrition specialist (dietitian). This may include: ? Eating fewer calories. ? Eating more protein. ? Eating less unhealthy fats. ? Eating a diet that includes fresh fruits and vegetables, whole grains, low-fat dairy products, and lean protein. ? Avoiding foods with added fat, salt, and sugar.  Drink plenty of water while you exercise to prevent dehydration or heat stroke.   Activity  Choose an activity that you enjoy and set realistic goals. Your health care provider can help you make an exercise plan that works for you.  Exercise at a moderate or vigorous intensity most days of the week. ? The intensity of exercise may vary from person to person. You can tell how intense a workout is for you by paying attention to your breathing and heartbeat. Most people will notice their  breathing and heartbeat get faster with more intense exercise.  Do resistance training twice each week, such as: ? Push-ups. ? Sit-ups. ? Lifting weights. ? Using resistance bands.  Getting short amounts of exercise can be just as helpful as long structured periods of exercise. If you have trouble finding time to exercise, try to include  exercise in your daily routine. ? Get up, stretch, and walk around every 30 minutes throughout the day. ? Go for a walk during your lunch break. ? Park your car farther away from your destination. ? If you take public transportation, get off one stop early and walk the rest of the way. ? Make phone calls while standing up and walking around. ? Take the stairs instead of elevators or escalators.  Wear comfortable clothes and shoes with good support.  Do not exercise so much that you hurt yourself, feel dizzy, or get very short of breath. Where to find more information  [place] Department of Health and Human Services: [employer]at  Centers for Disease Control and Prevention (CDC): http://www.wolf.info/ Contact a health care provider:  Before starting a new exercise program.  If you have questions or concerns about your weight.  If you have a medical problem that keeps you from exercising. Get help right away if you have any of the following while exercising:  Injury.  Dizziness.  Difficulty breathing or shortness of breath that does not go away when you stop exercising.  Chest pain.  Rapid heartbeat. Summary  Being overweight increases your risk of heart disease, stroke, diabetes, high blood pressure, and several types of cancer.  Losing weight happens when you burn more calories than you eat.  Reducing the amount of calories you eat in addition to getting regular moderate or vigorous exercise each week helps you lose weight. This information is not intended to replace advice given to you by your health care provider. Make sure you discuss any  questions you have with your health care provider. Document Revised: 04/19/2019 Document Reviewed: 04/19/2019 Elsevier Patient Education  2021 [employer].

## 2020-05-06 NOTE — Progress Notes (Signed)
I,Yamilka Roman [employer] as a Education administrator for [employer], FNP.,have documented all relevant documentation on the behalf of [name], FNP,as directed by  [name], FNP while in the presence of [name], [name]. This visit occurred during the SARS-CoV-2 public health emergency.  Safety protocols were in place, including screening questions prior to the visit, additional usage of staff PPE, and extensive cleaning of exam room while observing appropriate contact time as indicated for disinfecting solutions.  Subjective:     Patient ID: [name] [name] , male    DOB: 1970/09/17 , 50 y.o.   MRN: [id number]   Chief Complaint  Patient presents with  . Hypertension    HPI  Patient here for a f/u on his blood pressure. No complaints and is compliant with medications will occasionally miss a dose   Wt Readings from Last 3 Encounters: 05/06/20 : 250 lb 3.2 oz (113.5 kg) 02/06/20 : 250 lb (113.4 kg) 11/08/19 : 245 lb (111.1 kg)   Hypertension This is a chronic problem. The current episode started more than 1 year ago. The problem is unchanged. The problem is controlled. Pertinent negatives include no anxiety, blurred vision or headaches. There are no associated agents to hypertension. Risk factors for coronary artery disease include obesity and sedentary lifestyle. Past treatments include beta blockers. The current treatment provides no improvement. There are no compliance problems.  There is no history of angina. There is no history of chronic renal disease.     Past Medical History:  Diagnosis Date  . Anemia    iron def  . Anxiety   . B12 deficiency   . Crohn's ileocolitis ([place])    19m since 2007  . Hemorrhoids   . Hypertension   . Prehypertension 03/09/2013  . Pulmonary embolism (HCC)      Family History  Problem Relation Age of Onset  . Breast cancer Mother   . Diabetes Father   . COPD Father   . Crohn's disease Other   . Colon cancer Neg Hx   . Rectal cancer Neg Hx    . Stomach cancer Neg Hx      Current Outpatient Medications:  .  aspirin EC 81 MG tablet, Take 81 mg by mouth daily., Disp: , Rfl:  .  metoprolol succinate (TOPROL-XL) 25 MG 24 hr tablet, TAKE 1 TABLET BY MOUTH EVERY DAY, Disp: 90 tablet, Rfl: 1 .  Multiple Vitamin (MULTIVITAMIN WITH MINERALS) TABS tablet, Take 1 tablet by mouth daily., Disp: , Rfl:  .  omeprazole (PRILOSEC) 20 MG capsule, Take 1 capsule (20 mg total) by mouth daily., Disp: 30 capsule, Rfl: 1 .  pantoprazole (PROTONIX) 40 MG tablet, Take 1 tablet (40 mg total) by mouth daily., Disp: 90 tablet, Rfl: 1 .  Vitamin D, Ergocalciferol, (DRISDOL) 1.25 MG (50000 UNIT) CAPS capsule, TAKE 1 CAPSULE (50,000 UNITS TOTAL) BY MOUTH EVERY 7 DAYS, Disp: 12 capsule, Rfl: 1   No Known Allergies   Review of Systems  Constitutional: Negative.   HENT: Negative.   Eyes: Negative.  Negative for blurred vision.  Respiratory: Negative.   Cardiovascular: Negative.   Gastrointestinal: Negative.   Endocrine: Negative.   Genitourinary: Negative.   Musculoskeletal: Negative.  Negative for arthralgias.       Intermittent heel pain when wearing a shoe with a small heel, pain when getting out of bed.    He will occasionally have tingling to his hands and a feeling of a heart beat to his right shoulder area.  Skin:  Negative.   Neurological: Negative.  Negative for dizziness, numbness and headaches.  Hematological: Negative.   Psychiatric/Behavioral: Negative.      Today's Vitals   05/06/20 0838  BP: 114/78  Pulse: 84  Temp: 98.1 F (36.7 C)  TempSrc: Oral  Weight: 250 lb 3.2 oz (113.5 kg)  Height: 5' 9.8" (1.773 m)  PainSc: 0-No pain   Body mass index is 36.11 kg/m.   Objective:  Physical Exam Vitals reviewed.  Constitutional:      General: He is not in acute distress.    Appearance: Normal appearance. He is obese.  Cardiovascular:     Rate and Rhythm: Normal rate and regular rhythm.     Pulses: Normal pulses.     Heart  sounds: Normal heart sounds. No murmur heard.   Pulmonary:     Effort: Pulmonary effort is normal. No respiratory distress.     Breath sounds: Normal breath sounds. No wheezing.  Musculoskeletal:        General: No swelling, tenderness or deformity. Normal range of motion.     Right lower leg: No edema.     Left lower leg: No edema.  Skin:    General: Skin is warm and dry.     Capillary Refill: Capillary refill takes less than 2 seconds.  Neurological:     General: No focal deficit present.     Mental Status: He is alert and oriented to person, place, and time.     Cranial Nerves: No cranial nerve deficit.     Motor: No weakness.  Psychiatric:        Mood and Affect: Mood normal.        Behavior: Behavior normal.        Thought Content: Thought content normal.        Judgment: Judgment normal.         Assessment And Plan:     1. Essential hypertension  Chronic, blood pressure is well controlled  Continue with current medications - BMP8+eGFR  2. Tingling sensation  He is having tingling sensation to lower extremities after sitting for long periods  Will check metabolic causes - TSH - Vitamin B12  3. Pain of right heel  Plantar fascitis vs heel spur  He is encouraged to stretch his foot regularly and to wear more comfortable shoes without heel  4. BMI 36.0-36.9,adult  Chronic  Discussed healthy diet and regular exercise options   Encouraged to exercise at least 150 minutes per week with just walking - BMP8+eGFR     Patient was given opportunity to ask questions. Patient verbalized understanding of the plan and was able to repeat key elements of the plan. All questions were answered to their satisfaction.  [name], FNP   I, [name], FNP, have reviewed all documentation for this visit. The documentation on 05/06/20 for the exam, diagnosis, procedures, and orders are all accurate and complete.   IF YOU HAVE BEEN REFERRED TO A SPECIALIST, IT MAY  TAKE 1-2 WEEKS TO SCHEDULE/PROCESS THE REFERRAL. IF YOU HAVE NOT HEARD FROM US/SPECIALIST IN TWO WEEKS, PLEASE GIVE [place] A CALL AT [phone].   THE PATIENT IS ENCOURAGED TO PRACTICE SOCIAL DISTANCING DUE TO THE COVID-19 PANDEMIC.

## 2020-06-23 ENCOUNTER — Encounter: Payer: Self-pay | Admitting: Nurse Practitioner

## 2020-06-23 ENCOUNTER — Ambulatory Visit: Payer: BC Managed Care – PPO | Admitting: Nurse Practitioner

## 2020-06-23 ENCOUNTER — Other Ambulatory Visit: Payer: Self-pay

## 2020-06-23 VITALS — BP 120/84 | HR 79 | Temp 98.2°F | Ht 69.8 in | Wt 248.0 lb

## 2020-06-23 DIAGNOSIS — R1084 Generalized abdominal pain: Secondary | ICD-10-CM | POA: Diagnosis not present

## 2020-06-23 DIAGNOSIS — R197 Diarrhea, unspecified: Secondary | ICD-10-CM | POA: Diagnosis not present

## 2020-06-23 NOTE — Progress Notes (Signed)
I,Yamilka Roman [employer] as a Education administrator for [employer], FNP.,have documented all relevant documentation on the behalf of [name], FNP,as directed by  [name], FNP while in the presence of [name], [place].  This visit occurred during the SARS-CoV-2 public health emergency.  Safety protocols were in place, including screening questions prior to the visit, additional usage of staff PPE, and extensive cleaning of exam room while observing appropriate contact time as indicated for disinfecting solutions.  Subjective:     Patient ID: [name] [name] , male    DOB: 1970-05-13 , 50 y.o.   MRN: [id number]   Chief Complaint  Patient presents with   Abdominal Pain    Patient presents today because last week he was sick for a few days he stated he had diarrhea on Monday and Tuesday. He isnt sure what was going on with him     HPI  He has not seen his GI provider since before the pandemic. He took his covid test on Wednesday  Abdominal Pain This is a new problem. The current episode started in the past 7 days. The onset quality is sudden. Episode frequency: he reports having diarrhea from [place]. took home covid test was negative. The quality of the pain is aching. The abdominal pain does not radiate. Associated symptoms include diarrhea. Pertinent negatives include no anorexia, arthralgias, constipation, fever, frequency or vomiting. Nothing aggravates the pain. Treatments tried: alka seltzer and nyquil.    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[place])    57m since 2007   Hemorrhoids    Hypertension    Prehypertension 03/09/2013   [place]      Family History  Problem Relation Age of Onset   Breast cancer Mother    Diabetes Father    COPD Father    Crohn's disease Other    Colon cancer Neg Hx    Rectal cancer Neg Hx    Stomach cancer Neg Hx      Current Outpatient Medications:     aspirin EC 81 MG tablet, Take 81 mg by mouth daily., Disp: , Rfl:    metoprolol succinate (TOPROL-XL) 25 MG 24 hr tablet, TAKE 1 TABLET BY MOUTH EVERY DAY, Disp: 90 tablet, Rfl: 1   Multiple Vitamin (MULTIVITAMIN WITH MINERALS) TABS tablet, Take 1 tablet by mouth daily., Disp: , Rfl:    omeprazole (PRILOSEC) 20 MG capsule, Take 1 capsule (20 mg total) by mouth daily., Disp: 30 capsule, Rfl: 1   pantoprazole (PROTONIX) 40 MG tablet, Take 1 tablet (40 mg total) by mouth daily., Disp: 90 tablet, Rfl: 1   Vitamin D, Ergocalciferol, (DRISDOL) 1.25 MG (50000 UNIT) CAPS capsule, TAKE 1 CAPSULE (50,000 UNITS TOTAL) BY MOUTH EVERY 7 DAYS, Disp: 12 capsule, Rfl: 1   No Known Allergies   Review of Systems  Constitutional:  Negative for fever.  Gastrointestinal:  Positive for abdominal pain and diarrhea. Negative for anorexia, constipation and vomiting.  Genitourinary:  Negative for frequency.  Musculoskeletal:  Negative for arthralgias.    Today's Vitals   06/23/20 1203  BP: 120/84  Pulse: 79  Temp: 98.2 F (36.8 C)  Weight: 248 lb (112.5 kg)  Height: 5' 9.8" (1.773 m)  PainSc: 0-No pain   Body mass index is 35.79 kg/m.   Objective:  Physical Exam Vitals reviewed.  Constitutional:  Appearance: He is well-developed.  Cardiovascular:     Rate and Rhythm: Normal rate and regular rhythm.     Heart sounds: Normal heart sounds. No murmur heard. Pulmonary:     Effort: Pulmonary effort is normal. No respiratory distress.     Breath sounds: Normal breath sounds. No wheezing.  Abdominal:     General: A surgical scar is present. Bowel sounds are normal.     Palpations: Abdomen is soft. There is no shifting dullness.     Tenderness: There is abdominal tenderness. There is no guarding or rebound. Negative signs include Murphy's sign and McBurney's sign.     Comments: rounded  Neurological:     Mental Status: He is alert.  Psychiatric:        Mood and Affect: Mood normal.        Behavior:  Behavior normal.        Assessment And Plan:     1. Generalized abdominal pain This has resolved according to the patient except for slight tenderness on palpation Advised if worsens to return call to office may need imaging studies   2. Diarrhea, unspecified type Will check labs for dehydration and infection - BMP8+eGFR - CBC    Patient was given opportunity to ask questions. Patient verbalized understanding of the plan and was able to repeat key elements of the plan. All questions were answered to their satisfaction.  [name], FNP   I, [name], FNP, have reviewed all documentation for this visit. The documentation on 06/23/20 for the exam, diagnosis, procedures, and orders are all accurate and complete.   IF YOU HAVE BEEN REFERRED TO A SPECIALIST, IT MAY TAKE 1-2 WEEKS TO SCHEDULE/PROCESS THE REFERRAL. IF YOU HAVE NOT HEARD FROM US/SPECIALIST IN TWO WEEKS, PLEASE GIVE [place] A CALL AT [phone].   THE PATIENT IS ENCOURAGED TO PRACTICE SOCIAL DISTANCING DUE TO THE COVID-19 PANDEMIC.

## 2020-06-24 LAB — BMP8+EGFR
BUN/Creatinine Ratio: 10 (ref 9–20)
BUN: 12 mg/dL (ref 6–24)
CO2: 24 mmol/L (ref 20–29)
Calcium: 9.3 mg/dL (ref 8.7–10.2)
Chloride: 105 mmol/L (ref 96–106)
Creatinine, Ser: 1.16 mg/dL (ref 0.76–1.27)
Glucose: 83 mg/dL (ref 65–99)
Potassium: 4.5 mmol/L (ref 3.5–5.2)
Sodium: 143 mmol/L (ref 134–144)
eGFR: 78 mL/min/{1.73_m2} (ref >59)

## 2020-06-24 LAB — CBC
Hematocrit: 44.4 % (ref 37.5–51.0)
Hemoglobin: 14.3 g/dL (ref 13.0–17.7)
MCH: 26.4 pg — ABNORMAL LOW (ref 26.6–33.0)
MCHC: 32.2 g/dL (ref 31.5–35.7)
MCV: 82 fL (ref 79–97)
Platelets: 354 10*3/uL (ref 150–450)
RBC: 5.41 x10E6/uL (ref 4.14–5.80)
RDW: 13.5 % (ref 11.6–15.4)
WBC: 5.3 10*3/uL (ref 3.4–10.8)

## 2020-07-22 ENCOUNTER — Encounter: Payer: Self-pay | Admitting: Gastroenterology

## 2020-07-22 ENCOUNTER — Ambulatory Visit: Payer: BC Managed Care – PPO | Admitting: Gastroenterology

## 2020-07-22 VITALS — BP 110/86 | HR 88 | Ht 69.5 in | Wt 249.0 lb

## 2020-07-22 DIAGNOSIS — K50819 Crohn's disease of both small and large intestine with unspecified complications: Secondary | ICD-10-CM | POA: Diagnosis not present

## 2020-07-22 DIAGNOSIS — R1032 Left lower quadrant pain: Secondary | ICD-10-CM

## 2020-07-22 NOTE — Patient Instructions (Signed)
Follow up as needed.  Normal BMI (Body Mass Index- based on height and weight) is between 19 and 25. Your BMI today is Body mass index is 36.24 kg/m. Marland Kitchen Please consider follow up  regarding your BMI with your Primary Care Provider.  The [facility] providers would like to encourage you to use [facility] to communicate with providers for non-urgent requests or questions.  Due to long hold times on the telephone, sending your provider a message by [facility] may be a faster and more efficient way to get a response.  Please allow 48 business hours for a response.  Please remember that this is for non-urgent requests.   Thank you for choosing me and [facility].  Pricilla Riffle. Dagoberto Ligas., MD., Marval Regal

## 2020-07-22 NOTE — Progress Notes (Signed)
    History of Present Illness: This is a 50 year old male with LLQ pain, diarrhea for 3 day last month. His symptoms completely resolved and have not recurred.  He currently has no gastrointestinal complaints.  He has a history of Crohn's ileocolitis s/p ileocecectomy with colostomy and then reversal in 2007.  He has been asymptomatic for years and is no longer on IBD medications.  Colonoscopy in May 2021 as below with a few erosions and minimally active ileitis in the neo-terminal ileum.  Colonoscopy 05/2019 - Patent end-to-side ileo-colonic anastomosis, characterized by healthy appearing mucosa. - A few erosions in the neo-terminal ileum. Biopsied. Minimally active ileitis.  - Internal hemorrhoids. - The examination was otherwise normal on direct and retroflexion views. Random biopsies obtained. Normal.   Current Medications, Allergies, Past Medical History, Past Surgical History, Family History and Social History were reviewed in [employer] record.   Physical Exam: General: Well developed, well nourished, no acute distress Head: Normocephalic and atraumatic Eyes: Sclerae anicteric, EOMI Ears: Normal auditory acuity Mouth: Not examined, mask on during Covid-19 pandemic Lungs: Clear throughout to auscultation Heart: Regular rate and rhythm; no murmurs, rubs or bruits Abdomen: Soft, non tender and non distended. No masses, hepatosplenomegaly or hernias noted. Normal Bowel sounds Rectal: Not done Musculoskeletal: Symmetrical with no gross deformities  Pulses:  Normal pulses noted Extremities: No clubbing, cyanosis, edema or deformities noted Neurological: Alert oriented x 4, grossly nonfocal Psychological:  Alert and cooperative. Normal mood and affect   Assessment and Recommendations:  Acute illness with diarrhea and left lower quadrant pain resolved after 3 days.  Possible viral or food related. History of Crohn's ileocolitis status post ileocecectomy with  colostomy and then colostomy reversal.  Minimal inflammation on colonoscopy last year.  Continue to observe off medications.  He is advised to have close follow-up with his PCP and to return to GI if he has ongoing gastrointestinal symptoms. A 5-year interval surveillance colonoscopy is recommended.

## 2020-07-31 ENCOUNTER — Other Ambulatory Visit: Payer: Self-pay | Admitting: Nurse Practitioner

## 2020-07-31 DIAGNOSIS — E559 Vitamin D deficiency, unspecified: Secondary | ICD-10-CM

## 2020-08-07 ENCOUNTER — Other Ambulatory Visit: Payer: Self-pay

## 2020-08-07 ENCOUNTER — Ambulatory Visit (INDEPENDENT_AMBULATORY_CARE_PROVIDER_SITE_OTHER): Payer: BC Managed Care – PPO | Admitting: Nurse Practitioner

## 2020-08-07 ENCOUNTER — Other Ambulatory Visit: Payer: Self-pay | Admitting: Nurse Practitioner

## 2020-08-07 ENCOUNTER — Encounter: Payer: Self-pay | Admitting: Nurse Practitioner

## 2020-08-07 VITALS — BP 130/70 | HR 82 | Temp 98.6°F | Ht 69.5 in | Wt 247.6 lb

## 2020-08-07 DIAGNOSIS — Z125 Encounter for screening for malignant neoplasm of prostate: Secondary | ICD-10-CM

## 2020-08-07 DIAGNOSIS — R7309 Other abnormal glucose: Secondary | ICD-10-CM

## 2020-08-07 DIAGNOSIS — Z0000 Encounter for general adult medical examination without abnormal findings: Secondary | ICD-10-CM | POA: Diagnosis not present

## 2020-08-07 DIAGNOSIS — E559 Vitamin D deficiency, unspecified: Secondary | ICD-10-CM

## 2020-08-07 DIAGNOSIS — E7800 Pure hypercholesterolemia, unspecified: Secondary | ICD-10-CM

## 2020-08-07 DIAGNOSIS — I10 Essential (primary) hypertension: Secondary | ICD-10-CM

## 2020-08-07 DIAGNOSIS — Z6836 Body mass index (BMI) 36.0-36.9, adult: Secondary | ICD-10-CM

## 2020-08-07 LAB — POCT URINALYSIS DIPSTICK
Bilirubin, UA: NEGATIVE
Blood, UA: NEGATIVE
Glucose, UA: NEGATIVE
Ketones, UA: NEGATIVE
Leukocytes, UA: NEGATIVE
Nitrite, UA: NEGATIVE
Protein, UA: NEGATIVE
Spec Grav, UA: 1.02 (ref 1.010–1.025)
Urobilinogen, UA: 0.2 E.U./dL
pH, UA: 6 (ref 5.0–8.0)

## 2020-08-07 LAB — POCT UA - MICROALBUMIN
Albumin/Creatinine Ratio, Urine, POC: <30
Creatinine, POC: 300 mg/dL
Microalbumin Ur, POC: 10 mg/L

## 2020-08-07 NOTE — Progress Notes (Signed)
I,Tianna Badgett,acting as a Education administrator for [employer], FNP.,have documented all relevant documentation on the behalf of [name], FNP,as directed by  [name], FNP while in the presence of [name], [place].  This visit occurred during the SARS-CoV-2 public health emergency.  Safety protocols were in place, including screening questions prior to the visit, additional usage of staff PPE, and extensive cleaning of exam room while observing appropriate contact time as indicated for disinfecting solutions.  Subjective:     Patient ID: [name] [name] , male    DOB: March 30, 1970 , 50 y.o.   MRN: [id number]   Chief Complaint  Patient presents with   Annual Exam    HPI  Presents today for his annual HM examination. Overall doing well. Seen GI provider no new concerns - thought to have had viral or food related diarrhea  Wt Readings from Last 3 Encounters: 08/07/20 : 247 lb 9.6 oz (112.3 kg) 07/22/20 : 249 lb (112.9 kg) 06/23/20 : 248 lb (112.5 kg)    Hypertension This is a chronic problem. The current episode started more than 1 year ago. The problem is unchanged. The problem is controlled. Pertinent negatives include no anxiety. There are no associated agents to hypertension. Risk factors for coronary artery disease include obesity and sedentary lifestyle. Past treatments include beta blockers. The current treatment provides no improvement. There are no compliance problems.  There is no history of angina. There is no history of chronic renal disease.    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[place])    24m since 2007   Hemorrhoids    Hypertension    Prehypertension 03/09/2013   [place]    Rectal discomfort 05/10/2019     Family History  Problem Relation Age of Onset   Breast cancer Mother    Diabetes Father    COPD Father    Crohn's disease Other    Colon cancer Neg Hx    Rectal cancer  Neg Hx    Stomach cancer Neg Hx      Current Outpatient Medications:    aspirin EC 81 MG tablet, Take 81 mg by mouth daily., Disp: , Rfl:    Multiple Vitamin (MULTIVITAMIN WITH MINERALS) TABS tablet, Take 1 tablet by mouth once a week., Disp: , Rfl:    omeprazole (PRILOSEC) 20 MG capsule, Take 1 capsule (20 mg total) by mouth daily., Disp: 30 capsule, Rfl: 1   pantoprazole (PROTONIX) 40 MG tablet, Take 1 tablet (40 mg total) by mouth daily., Disp: 90 tablet, Rfl: 1   Vitamin D, Ergocalciferol, (DRISDOL) 1.25 MG (50000 UNIT) CAPS capsule, TAKE 1 CAPSULE BY MOUTH ONE TIME PER WEEK, Disp: 12 capsule, Rfl: 1   metoprolol succinate (TOPROL-XL) 25 MG 24 hr tablet, TAKE 1 TABLET BY MOUTH EVERY DAY, Disp: 90 tablet, Rfl: 1   No Known Allergies   Men's preventive visit. Patient Health Questionnaire (PHQ-2) is  [place] [facility]  PHQ-2 Total Score 0      Patient is on a Regular diet. Exercise - none - plans to start back walking. Marital status: Married. Relevant history for alcohol use is:  Social History   Substance and Sexual Activity  Alcohol Use No   Relevant history for tobacco use is:  Social History   Tobacco Use  Smoking Status Never  Smokeless  Tobacco Never  .   Review of Systems  Constitutional: Negative.   HENT: Negative.    Eyes: Negative.   Respiratory: Negative.    Cardiovascular: Negative.   Gastrointestinal: Negative.   Endocrine: Negative.   Genitourinary: Negative.   Musculoskeletal: Negative.   Skin: Negative.   Allergic/Immunologic: Negative.   Neurological: Negative.   Hematological: Negative.   Psychiatric/Behavioral: Negative.      Today's Vitals   08/07/20 0907  BP: 130/70  Pulse: 82  Temp: 98.6 F (37 C)  TempSrc: Oral  Weight: 247 lb 9.6 oz (112.3 kg)  Height: 5' 9.5" (1.765 m)   Body mass index is 36.04 kg/m.  Wt Readings from Last 3 Encounters:  08/07/20 247 lb 9.6 oz (112.3  kg)  07/22/20 249 lb (112.9 kg)  06/23/20 248 lb (112.5 kg)    Objective:  Physical Exam Vitals reviewed.  Constitutional:      General: He is not in acute distress.    Appearance: Normal appearance. He is obese.  HENT:     Head: Normocephalic and atraumatic.     Right Ear: Tympanic membrane, ear canal and external ear normal. There is no impacted cerumen.     Left Ear: Tympanic membrane, ear canal and external ear normal. There is no impacted cerumen.     Nose:     Comments: Deferred - masked    Mouth/Throat:     Comments: Deferred - masked Eyes:     Extraocular Movements: Extraocular movements intact.     Conjunctiva/sclera: Conjunctivae normal.     Pupils: Pupils are equal, round, and reactive to light.  Cardiovascular:     Rate and Rhythm: Normal rate and regular rhythm.     Pulses: Normal pulses.     Heart sounds: Normal heart sounds. No murmur heard. Pulmonary:     Effort: Pulmonary effort is normal. No respiratory distress.     Breath sounds: Normal breath sounds. No wheezing.  Abdominal:     General: Abdomen is flat. Bowel sounds are normal. There is no distension.     Palpations: Abdomen is soft. There is no mass.     Tenderness: There is no abdominal tenderness.  Genitourinary:    Prostate: Normal.     Rectum: Guaiac result negative.  Musculoskeletal:        General: Normal range of motion.     Cervical back: Normal range of motion and neck supple.  Skin:    General: Skin is warm and dry.     Capillary Refill: Capillary refill takes less than 2 seconds.     Comments: Scarring and keloids noted to abdomen from previous surgery  Neurological:     General: No focal deficit present.     Mental Status: He is alert and oriented to person, place, and time.     Cranial Nerves: No cranial nerve deficit.     Motor: No weakness.  Psychiatric:        Mood and Affect: Mood normal.        Behavior: Behavior normal.        Thought Content: Thought content normal.         Judgment: Judgment normal.        Assessment And Plan:    1. Encounter for annual physical exam Behavior modifications discussed and diet history reviewed.   Pt will continue to exercise regularly and modify diet with low GI, plant based foods and decrease intake of processed foods.  Recommend intake of daily multivitamin,  Vitamin D, and calcium.  Recommend colonoscopy for preventive screenings, as well as recommend immunizations that include influenza, TDAP (up to date)  2. Class 2 severe obesity due to excess calories with serious comorbidity and body mass index (BMI) of 36.0 to 36.9 in adult [facility]) Chronic Discussed healthy diet and regular exercise options  Encouraged to exercise at least 150 minutes per week with 2 days of strength training as tolerated  He is encouraged to initially strive for BMI less than 30 to decrease cardiac risk.   3. Encounter for prostate cancer screening - PSA  4. Essential hypertension Comments: Good control Continue current medications EKG done with NSR 92 - POCT Urinalysis Dipstick (81002) - POCT UA - Microalbumin - EKG 12-Lead - CBC - CMP14+EGFR  5. Hypercholesteremia Comments: Stable, diet controlled Low fat diet - Lipid panel  6. Vitamin D deficiency - CBC  7. Abnormal glucose Comments: Stable, no current medications Encouraged to increase physical activity - Hemoglobin A1c      Patient was given opportunity to ask questions. Patient verbalized understanding of the plan and was able to repeat key elements of the plan. All questions were answered to their satisfaction.   [name], FNP   I, [name], FNP, have reviewed all documentation for this visit. The documentation on 08/07/20 for the exam, diagnosis, procedures, and orders are all accurate and complete.   THE PATIENT IS ENCOURAGED TO PRACTICE SOCIAL DISTANCING DUE TO THE COVID-19 PANDEMIC.

## 2020-08-07 NOTE — Patient Instructions (Signed)
Health Maintenance, Male Adopting a healthy lifestyle and getting preventive care are important in promoting health and wellness. Ask your health care provider about: The right schedule for you to have regular tests and exams. Things you can do on your own to prevent diseases and keep yourself healthy. What should I know about diet, weight, and exercise? Eat a healthy diet  Eat a diet that includes plenty of vegetables, fruits, low-fat dairy products, and lean protein. Do not eat a lot of foods that are high in solid fats, added sugars, or sodium.  Maintain a healthy weight Body mass index (BMI) is a measurement that can be used to identify possible weight problems. It estimates body fat based on height and weight. Your health care provider can help determine your BMI and help you achieve or maintain ahealthy weight. Get regular exercise Get regular exercise. This is one of the most important things you can do for your health. Most adults should: Exercise for at least 150 minutes each week. The exercise should increase your heart rate and make you sweat (moderate-intensity exercise). Do strengthening exercises at least twice a week. This is in addition to the moderate-intensity exercise. Spend less time sitting. Even light physical activity can be beneficial. Watch cholesterol and blood lipids Have your blood tested for lipids and cholesterol at 49 years of age, then havethis test every 5 years. You may need to have your cholesterol levels checked more often if: Your lipid or cholesterol levels are high. You are older than 49 years of age. You are at high risk for heart disease. What should I know about cancer screening? Many types of cancers can be detected early and may often be prevented. Depending on your health history and family history, you may need to have cancer screening at various ages. This may include screening for: Colorectal cancer. Prostate cancer. Skin cancer. Lung  cancer. What should I know about heart disease, diabetes, and high blood pressure? Blood pressure and heart disease High blood pressure causes heart disease and increases the risk of stroke. This is more likely to develop in people who have high blood pressure readings, are of African descent, or are overweight. Talk with your health care provider about your target blood pressure readings. Have your blood pressure checked: Every 3-5 years if you are 18-39 years of age. Every year if you are 40 years old or older. If you are between the ages of 65 and 75 and are a current or former smoker, ask your health care provider if you should have a one-time screening for abdominal aortic aneurysm (AAA). Diabetes Have regular diabetes screenings. This checks your fasting blood sugar level. Have the screening done: Once every three years after age 45 if you are at a normal weight and have a low risk for diabetes. More often and at a younger age if you are overweight or have a high risk for diabetes. What should I know about preventing infection? Hepatitis B If you have a higher risk for hepatitis B, you should be screened for this virus. Talk with your health care provider to find out if you are at risk forhepatitis B infection. Hepatitis C Blood testing is recommended for: Everyone born from 1945 through 1965. Anyone with known risk factors for hepatitis C. Sexually transmitted infections (STIs) You should be screened each year for STIs, including gonorrhea and chlamydia, if: You are sexually active and are younger than 49 years of age. You are older than 49 years of age   and your health care provider tells you that you are at risk for this type of infection. Your sexual activity has changed since you were last screened, and you are at increased risk for chlamydia or gonorrhea. Ask your health care provider if you are at risk. Ask your health care provider about whether you are at high risk for HIV.  Your health care provider may recommend a prescription medicine to help prevent HIV infection. If you choose to take medicine to prevent HIV, you should first get tested for HIV. You should then be tested every 3 months for as long as you are taking the medicine. Follow these instructions at home: Lifestyle Do not use any products that contain nicotine or tobacco, such as cigarettes, e-cigarettes, and chewing tobacco. If you need help quitting, ask your health care provider. Do not use street drugs. Do not share needles. Ask your health care provider for help if you need support or information about quitting drugs. Alcohol use Do not drink alcohol if your health care provider tells you not to drink. If you drink alcohol: Limit how much you have to 0-2 drinks a day. Be aware of how much alcohol is in your drink. In the [place] one drink equals one 12 oz bottle of beer (355 mL), one 5 oz glass of wine (148 mL), or one 1 oz glass of hard liquor (44 mL). General instructions Schedule regular health, dental, and eye exams. Stay current with your vaccines. Tell your health care provider if: You often feel depressed. You have ever been abused or do not feel safe at home. Summary Adopting a healthy lifestyle and getting preventive care are important in promoting health and wellness. Follow your health care provider's instructions about healthy diet, exercising, and getting tested or screened for diseases. Follow your health care provider's instructions on monitoring your cholesterol and blood pressure. This information is not intended to replace advice given to you by your health care provider. Make sure you discuss any questions you have with your healthcare provider. Document Revised: 12/14/2017 Document Reviewed: 12/14/2017 Elsevier Patient Education  2022 Elsevier Inc.  

## 2020-08-08 LAB — CMP14+EGFR
ALT: 28 IU/L (ref 0–44)
AST: 22 IU/L (ref 0–40)
Albumin/Globulin Ratio: 1.5 (ref 1.2–2.2)
Albumin: 4.2 g/dL (ref 4.0–5.0)
Alkaline Phosphatase: 83 IU/L (ref 44–121)
BUN/Creatinine Ratio: 7 — ABNORMAL LOW (ref 9–20)
BUN: 9 mg/dL (ref 6–24)
Bilirubin Total: 0.9 mg/dL (ref 0.0–1.2)
CO2: 23 mmol/L (ref 20–29)
Calcium: 9.5 mg/dL (ref 8.7–10.2)
Chloride: 103 mmol/L (ref 96–106)
Creatinine, Ser: 1.3 mg/dL — ABNORMAL HIGH (ref 0.76–1.27)
Globulin, Total: 2.8 g/dL (ref 1.5–4.5)
Glucose: 87 mg/dL (ref 65–99)
Potassium: 4.4 mmol/L (ref 3.5–5.2)
Sodium: 139 mmol/L (ref 134–144)
Total Protein: 7 g/dL (ref 6.0–8.5)
eGFR: 67 mL/min/{1.73_m2} (ref >59)

## 2020-08-08 LAB — LIPID PANEL
Chol/HDL Ratio: 4.4 ratio (ref 0.0–5.0)
Cholesterol, Total: 180 mg/dL (ref 100–199)
HDL: 41 mg/dL (ref >39)
LDL Chol Calc (NIH): 118 mg/dL — ABNORMAL HIGH (ref 0–99)
Triglycerides: 113 mg/dL (ref 0–149)
VLDL Cholesterol Cal: 21 mg/dL (ref 5–40)

## 2020-08-08 LAB — HEMOGLOBIN A1C
Est. average glucose Bld gHb Est-mCnc: 120 mg/dL
Hgb A1c MFr Bld: 5.8 % — ABNORMAL HIGH (ref 4.8–5.6)

## 2020-08-08 LAB — CBC
Hematocrit: 47.3 % (ref 37.5–51.0)
Hemoglobin: 14.5 g/dL (ref 13.0–17.7)
MCH: 25.8 pg — ABNORMAL LOW (ref 26.6–33.0)
MCHC: 30.7 g/dL — ABNORMAL LOW (ref 31.5–35.7)
MCV: 84 fL (ref 79–97)
Platelets: 329 10*3/uL (ref 150–450)
RBC: 5.61 x10E6/uL (ref 4.14–5.80)
RDW: 14 % (ref 11.6–15.4)
WBC: 4.2 10*3/uL (ref 3.4–10.8)

## 2020-08-08 LAB — PSA: Prostate Specific Ag, Serum: 1.1 ng/mL (ref 0.0–4.0)

## 2020-08-14 ENCOUNTER — Other Ambulatory Visit: Payer: Self-pay | Admitting: Nurse Practitioner

## 2020-08-14 DIAGNOSIS — K219 Gastro-esophageal reflux disease without esophagitis: Secondary | ICD-10-CM

## 2020-09-23 ENCOUNTER — Other Ambulatory Visit: Payer: Self-pay

## 2020-09-23 ENCOUNTER — Encounter: Payer: Self-pay | Admitting: Nurse Practitioner

## 2020-09-23 ENCOUNTER — Telehealth (INDEPENDENT_AMBULATORY_CARE_PROVIDER_SITE_OTHER): Payer: BC Managed Care – PPO | Admitting: Nurse Practitioner

## 2020-09-23 VITALS — Temp 98.0°F | Ht 69.5 in

## 2020-09-23 DIAGNOSIS — R059 Cough, unspecified: Secondary | ICD-10-CM

## 2020-09-23 DIAGNOSIS — U071 COVID-19: Secondary | ICD-10-CM

## 2020-09-23 MED ORDER — GUAIFENESIN-DM 100-10 MG/5ML PO SYRP
5.0000 mL | ORAL_SOLUTION | ORAL | 0 refills | Status: DC | PRN
Start: 2020-09-23 — End: 2022-05-25

## 2020-09-23 MED ORDER — MOLNUPIRAVIR EUA 200MG CAPSULE: 4.0000 | ORAL_CAPSULE | Freq: Two times a day (BID) | ORAL | 0 refills | Status: AC

## 2020-09-23 MED ORDER — BENZONATATE 100 MG PO CAPS: 100.0000 mg | ORAL_CAPSULE | Freq: Three times a day (TID) | ORAL | 1 refills | Status: AC | PRN

## 2020-09-23 NOTE — Progress Notes (Addendum)
Virtual Visit via 100% video    This visit type was conducted due to national recommendations for restrictions regarding the COVID-19 Pandemic (e.g. social distancing) in an effort to limit this patient's exposure and mitigate transmission in our community.  Due to his co-morbid illnesses, this patient is at least at moderate risk for complications without adequate follow up.  This format is felt to be most appropriate for this patient at this time.  All issues noted in this document were discussed and addressed.  A limited physical exam was performed with this format.    This visit type was conducted due to national recommendations for restrictions regarding the COVID-19 Pandemic (e.g. social distancing) in an effort to limit this patient's exposure and mitigate transmission in our community.  Patients identity confirmed using two different identifiers.  This format is felt to be most appropriate for this patient at this time.  All issues noted in this document were discussed and addressed.  No physical exam was performed (except for noted visual exam findings with Video Visits).    Date:  09/23/2020   ID:  [name] [name], DOB Apr 04, 1970, MRN [id number]  Patient Location:  Home   Provider location:   Office  Chief Complaint:  tested positive for covid 19 on Sunday.   History of Present Illness:    [name] [name] is a 50 y.o. male who presents via video conferencing for a telehealth visit today.    The patient does have symptoms concerning for COVID-19 infection (fever, chills, cough, or new shortness of breath).   The patient is having a video visit for covid over the weekend. He is having coughing, Sunday had a bad fever. Yesterday he was feeling better. Head is not hurting anymore. symptoms started Saturday. No fever, SOB, chest pain.  He is vaccinated and booster x 1. He has been taking some OTC Nyquil with some moderate relief.     Past Medical History:  Diagnosis Date    Anemia    iron def   Anxiety    B12 deficiency    Crohn's ileocolitis ([place])    50m since 2007   Hemorrhoids    Hypertension    Prehypertension 03/09/2013   [place]    Rectal discomfort 05/10/2019   Past Surgical History:  Procedure Laterality Date   COLONOSCOPY     EXTERNAL FIXATION LEG Left 02/27/2013   Procedure: EXTERNAL FIXATION LEFT KNEE ;  Surgeon: [name] MD;  [facility]  Service: Orthopedics;  Laterality: Left;   EXTERNAL FIXATION LEG Left 03/01/2013   Procedure: REVISION OF EXTERNAL FIXATOR LEFT LEG;  Surgeon: [name] MD;  [facility]  Service: [place]opedics;  Laterality: Left;   ileocecal resection  03/2005   ILEOSTOMY CLOSURE  08/2005   ileostomy/anastomotic resection for anastomotic leak  03/2005   KNEE ARTHROSCOPY Left 06/25/2013   Procedure: LYSIS OF ADHESION LEFT KNEE;  Surgeon: [name] MD;  [facility]  Service: Orthopedics;  Laterality: Left;   LYSIS OF ADHESION Left 06/25/2013   WITH MANIPULATION     DR HANDY   ORIF TIBIA FRACTURE Left 03/08/2013   Procedure: LEFT OPEN REDUCTION INTERNAL FIXATION (ORIF) TIBIA FRACTURE;  Surgeon: [name] MD;  [facility]  Service: Orthopedics;  Laterality: Left;     No outpatient medications have been marked as taking for the 09/23/20 encounter (Video Visit) with GBary Castilla NP.     Allergies:  Patient has no known allergies.   Social History   Tobacco Use   Smoking status: Never   Smokeless tobacco: Never  Vaping Use   Vaping Use: Never used  Substance Use Topics   Alcohol use: No   Drug use: No     Family Hx: The patient's family history includes Breast cancer in his mother; COPD in his father; Crohn's disease in an other family member; Diabetes in his father. There is no history of Colon cancer, Rectal cancer, or Stomach cancer.  ROS:   Please see the history of present illness.    Review of Systems  Constitutional:  Negative for chills and  fever.       Loss of taste   HENT:  Positive for congestion. Negative for sore throat.   Respiratory:  Positive for cough and sputum production. Negative for shortness of breath and wheezing.   Cardiovascular:  Negative for chest pain.  Gastrointestinal:  Negative for vomiting.  Neurological:  Positive for headaches.   All other systems reviewed and are negative.   Labs/Other Tests and Data Reviewed:    Recent Labs: 08/07/2020: ALT 28; BUN 9; Creatinine, Ser 1.30; Hemoglobin 14.5; Platelets 329; Potassium 4.4; Sodium 139   Recent Lipid Panel Lab Results  Component Value Date/Time   CHOL 180 08/07/2020 10:10 AM   TRIG 113 08/07/2020 10:10 AM   HDL 41 08/07/2020 10:10 AM   CHOLHDL 4.4 08/07/2020 10:10 AM   LDLCALC 118 (H) 08/07/2020 10:10 AM    Wt Readings from Last 3 Encounters:  08/07/20 247 lb 9.6 oz (112.3 kg)  07/22/20 249 lb (112.9 kg)  06/23/20 248 lb (112.5 kg)     Exam:    Vital Signs:  Temp 33 F (36.7 C) (Oral) Comment: pt provided  Ht 5' 9.5" (1.765 m)   BMI 36.04 kg/m     Physical Exam Vitals and nursing note reviewed.  HENT:     Head: Normocephalic and atraumatic.  Pulmonary:     Effort: Pulmonary effort is normal.  Neurological:     Mental Status: He is alert and oriented to person, place, and time.  Psychiatric:        Mood and Affect: Affect normal.    ASSESSMENT & PLAN:    1. COVID-19 virus infection - molnupiravir EUA (LAGEVRIO) 200 mg CAPS capsule; Take 4 capsules (800 mg total) by mouth 2 (two) times daily for 5 days.  Dispense: 40 capsule; Refill: 0  2. Cough - guaiFENesin-dextromethorphan (ROBITUSSIN DM) 100-10 MG/5ML syrup; Take 5 mLs by mouth every 4 (four) hours as needed for cough.  Dispense: 118 mL; Refill: 0 - benzonatate (TESSALON PERLES) 100 MG capsule; Take 1 capsule (100 mg total) by mouth 3 (three) times daily as needed for cough.  Dispense: 30 capsule; Refill: 1    The patient was encouraged to call or send a message through  [facility] for any questions or concerns.   Follow up: if symptoms persist or do not get better.   Side effects and appropriate use of all the medication(s) were discussed with the patient today. Patient advised to use the medication(s) as directed by their healthcare provider. The patient was encouraged to read, review, and understand all associated package inserts and contact our office with any questions or concerns. The patient accepts the risks of the treatment plan and had an opportunity to ask questions.   Advised patient to take Vitamin C, D, Zinc.  Keep yourself hydrated with a lot of water and  rest. Take Delsym for cough and Mucinex as need. Take Tylenol or pain reliever every 4-6 hours as needed for pain/fever/body ache. If you have elevated blood pressure, you can take OTC Corcidin. You can also take OTC oscillococcinum to help with your symptoms.  Educated patient if symptoms get worse or if she experiences any SOB, chest pain or pain in her legs to seek immediate emergency care. Continue to monitor your oxygen levels. Call us if you have any questions. Quarantine for 5 days if tested positive and no symptoms or 10 days if tested positive and have symptoms. Wear a mask around other people.   "I discussed the limitations of evaluation and management by telemedicine and the availability of in person appointments. The patient expressed understanding and agreed to proceed"    Patient was given opportunity to ask questions. Patient verbalized understanding of the plan and was able to repeat key elements of the plan. All questions were answered to their satisfaction.  Raman Ghumman, DNP   I, Raman Ghumman have reviewed all documentation for this visit. The documentation on 09/23/20 for the exam, diagnosis, procedures, and orders are all accurate and complete.     COVID-19 Education: The signs and symptoms of COVID-19 were discussed with the patient and how to seek care for testing (follow up  with PCP or arrange E-visit).  The importance of social distancing was discussed today.  Patient Risk:   After full review of this patients clinical status, I feel that they are at least moderate risk at this time.  Time:   Today, I have spent 15 minutes/ seconds with the patient with telehealth technology discussing above diagnoses.     Medication Adjustments/Labs and Tests Ordered: Current medicines are reviewed at length with the patient today.  Concerns regarding medicines are outlined above.   Tests Ordered: No orders of the defined types were placed in this encounter.   Medication Changes: No orders of the defined types were placed in this encounter.   Disposition:  Follow up if symptoms get worse  Signed, Michelle Nasuti, CMA

## 2021-01-27 ENCOUNTER — Other Ambulatory Visit: Payer: Self-pay

## 2021-01-27 ENCOUNTER — Ambulatory Visit (INDEPENDENT_AMBULATORY_CARE_PROVIDER_SITE_OTHER): Payer: BC Managed Care – PPO

## 2021-01-27 VITALS — BP 128/70 | HR 85 | Temp 98.3°F | Ht 69.5 in | Wt 250.0 lb

## 2021-01-27 DIAGNOSIS — Z23 Encounter for immunization: Secondary | ICD-10-CM

## 2021-01-27 NOTE — Progress Notes (Signed)
Patient is here for flu shot

## 2021-01-27 NOTE — Patient Instructions (Signed)
Influenza Virus Vaccine injection What is this medication? INFLUENZA VIRUS VACCINE (in floo EN zuh VAHY ruhs vak SEEN) helps to reduce the risk of getting influenza also known as the flu. The vaccine only helps protect you against some strains of the flu. This medicine may be used for other purposes; ask your health care provider or pharmacist if you have questions. COMMON BRAND NAME(S): Afluria, Afluria Quadrivalent, Agriflu, Alfuria, FLUAD, FLUAD Quadrivalent, Fluarix, Fluarix Quadrivalent, Flublok, Flublok Quadrivalent, FLUCELVAX, FLUCELVAX Quadrivalent, Flulaval, Flulaval Quadrivalent, Fluvirin, Fluzone, Fluzone High-Dose, Fluzone Intradermal, Fluzone Quadrivalent What should I tell my care team before I take this medication? They need to know if you have any of these conditions: bleeding disorder like hemophilia fever or infection Guillain-Barre syndrome or other neurological problems immune system problems infection with the human immunodeficiency virus (HIV) or AIDS low blood platelet counts multiple sclerosis an unusual or allergic reaction to influenza virus vaccine, latex, other medicines, foods, dyes, or preservatives. Different brands of vaccines contain different allergens. Some may contain latex or eggs. Talk to your doctor about your allergies to make sure that you get the right vaccine. pregnant or trying to get pregnant breast-feeding How should I use this medication? This vaccine is for injection into a muscle or under the skin. It is given by a health care professional. A copy of Vaccine Information Statements will be given before each vaccination. Read this sheet carefully each time. The sheet may change frequently. Talk to your healthcare provider to see which vaccines are right for you. Some vaccines should not be used in all age groups. Overdosage: If you think you have taken too much of this medicine contact a poison control center or emergency room at once. NOTE: This  medicine is only for you. Do not share this medicine with others. What if I miss a dose? This does not apply. What may interact with this medication? chemotherapy or radiation therapy medicines that lower your immune system like etanercept, anakinra, infliximab, and adalimumab medicines that treat or prevent blood clots like warfarin phenytoin steroid medicines like prednisone or cortisone theophylline vaccines This list may not describe all possible interactions. Give your health care provider a list of all the medicines, herbs, non-prescription drugs, or dietary supplements you use. Also tell them if you smoke, drink alcohol, or use illegal drugs. Some items may interact with your medicine. What should I watch for while using this medication? Report any side effects that do not go away within 3 days to your doctor or health care professional. Call your health care provider if any unusual symptoms occur within 6 weeks of receiving this vaccine. You may still catch the flu, but the illness is not usually as bad. You cannot get the flu from the vaccine. The vaccine will not protect against colds or other illnesses that may cause fever. The vaccine is needed every year. What side effects may I notice from receiving this medication? Side effects that you should report to your doctor or health care professional as soon as possible: allergic reactions like skin rash, itching or hives, swelling of the face, lips, or tongue Side effects that usually do not require medical attention (report to your doctor or health care professional if they continue or are bothersome): fever headache muscle aches and pains pain, tenderness, redness, or swelling at the injection site tiredness This list may not describe all possible side effects. Call your doctor for medical advice about side effects. You may report side effects to FDA at 1-800-FDA-1088.  Where should I keep my medication? The vaccine will be given  by a health care professional in a clinic, pharmacy, doctor's office, or other health care setting. You will not be given vaccine doses to store at home. NOTE: This sheet is a summary. It may not cover all possible information. If you have questions about this medicine, talk to your doctor, pharmacist, or health care provider.  2022 Elsevier/Gold Standard (2020-09-09 00:00:00)

## 2021-02-11 ENCOUNTER — Other Ambulatory Visit: Payer: Self-pay | Admitting: Nurse Practitioner

## 2021-02-11 ENCOUNTER — Other Ambulatory Visit: Payer: Self-pay

## 2021-02-11 ENCOUNTER — Ambulatory Visit: Payer: BC Managed Care – PPO | Admitting: Nurse Practitioner

## 2021-02-11 ENCOUNTER — Encounter: Payer: Self-pay | Admitting: Nurse Practitioner

## 2021-02-11 VITALS — BP 114/76 | HR 93 | Temp 98.2°F | Ht 69.5 in | Wt 248.2 lb

## 2021-02-11 DIAGNOSIS — E7800 Pure hypercholesterolemia, unspecified: Secondary | ICD-10-CM | POA: Diagnosis not present

## 2021-02-11 DIAGNOSIS — I10 Essential (primary) hypertension: Secondary | ICD-10-CM | POA: Diagnosis not present

## 2021-02-11 DIAGNOSIS — R7309 Other abnormal glucose: Secondary | ICD-10-CM

## 2021-02-11 DIAGNOSIS — E559 Vitamin D deficiency, unspecified: Secondary | ICD-10-CM | POA: Diagnosis not present

## 2021-02-11 DIAGNOSIS — Z6836 Body mass index (BMI) 36.0-36.9, adult: Secondary | ICD-10-CM

## 2021-02-11 DIAGNOSIS — K219 Gastro-esophageal reflux disease without esophagitis: Secondary | ICD-10-CM

## 2021-02-11 NOTE — Progress Notes (Signed)
I,Katawbba Wiggins,acting as a Education administrator for [employer], FNP.,have documented all relevant documentation on the behalf of [name], FNP,as directed by  [name], FNP while in the presence of [name], [place].   This visit occurred during the SARS-CoV-2 public health emergency.  Safety protocols were in place, including screening questions prior to the visit, additional usage of staff PPE, and extensive cleaning of exam room while observing appropriate contact time as indicated for disinfecting solutions.  Subjective:     Patient ID: [name] [name] , male    DOB: 10-19-1970 , 51 y.o.   MRN: [id number]   Chief Complaint  Patient presents with   Hypertension    HPI  Patient here for a f/u on his blood pressure.  At this time he is not exercising.  Diet continues to struggle. Ate steak for the first time Saturday night then had diarrhea.   Wt Readings from Last 3 Encounters: 02/11/21 : 248 lb 3.2 oz (112.6 kg) 01/27/21 : 250 lb (113.4 kg) 08/07/20 : 247 lb 9.6 oz (112.3 kg)    Hypertension This is a chronic problem. The current episode started more than 1 year ago. The problem is unchanged. The problem is controlled. Pertinent negatives include no anxiety, blurred vision or headaches. There are no associated agents to hypertension. Risk factors for coronary artery disease include obesity and sedentary lifestyle. Past treatments include beta blockers. The current treatment provides no improvement. There are no compliance problems.  There is no history of angina. There is no history of chronic renal disease.    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[place])    57m since 2007   Hemorrhoids    Hypertension    Prehypertension 03/09/2013   [place]    Rectal discomfort 05/10/2019     Family History  Problem Relation Age of Onset   Breast cancer Mother    Diabetes Father    COPD Father     Crohn's disease Other    Colon cancer Neg Hx    Rectal cancer Neg Hx    Stomach cancer Neg Hx      Current Outpatient Medications:    aspirin EC 81 MG tablet, Take 81 mg by mouth daily., Disp: , Rfl:    benzonatate (TESSALON PERLES) 100 MG capsule, Take 1 capsule (100 mg total) by mouth 3 (three) times daily as needed for cough., Disp: 30 capsule, Rfl: 1   Multiple Vitamin (MULTIVITAMIN WITH MINERALS) TABS tablet, Take 1 tablet by mouth once a week., Disp: , Rfl:    Vitamin D, Ergocalciferol, (DRISDOL) 1.25 MG (50000 UNIT) CAPS capsule, TAKE 1 CAPSULE BY MOUTH ONE TIME PER WEEK, Disp: 12 capsule, Rfl: 1   guaiFENesin-dextromethorphan (ROBITUSSIN DM) 100-10 MG/5ML syrup, Take 5 mLs by mouth every 4 (four) hours as needed for cough. (Patient not taking: Reported on 02/11/2021), Disp: 118 mL, Rfl: 0   metoprolol succinate (TOPROL-XL) 25 MG 24 hr tablet, TAKE 1 TABLET BY MOUTH EVERY DAY, Disp: 90 tablet, Rfl: 1   pantoprazole (PROTONIX) 40 MG tablet, TAKE 1 TABLET BY MOUTH EVERY DAY, Disp: 90 tablet, Rfl: 1   No Known Allergies   Review of Systems  Constitutional: Negative.   HENT: Negative.    Eyes: Negative.  Negative for blurred vision.  Respiratory: Negative.    Cardiovascular: Negative.   Gastrointestinal: Negative.   Endocrine: Negative.  Genitourinary: Negative.   Musculoskeletal: Negative.  Negative for arthralgias.  Skin: Negative.   Neurological: Negative.  Negative for dizziness, numbness and headaches.  Hematological: Negative.   Psychiatric/Behavioral: Negative.      Today's Vitals   02/11/21 0854  BP: 114/76  Pulse: 93  Temp: 98.2 F (36.8 C)  Weight: 248 lb 3.2 oz (112.6 kg)  Height: 5' 9.5" (1.765 m)   Body mass index is 36.13 kg/m.  Wt Readings from Last 3 Encounters:  02/11/21 248 lb 3.2 oz (112.6 kg)  01/27/21 250 lb (113.4 kg)  08/07/20 247 lb 9.6 oz (112.3 kg)    BP Readings from Last 3 Encounters:  02/11/21 114/76  01/27/21 128/70  08/07/20 130/70     Objective:  Physical Exam Vitals reviewed.  Constitutional:      General: He is not in acute distress.    Appearance: Normal appearance. He is obese.  Cardiovascular:     Rate and Rhythm: Normal rate and regular rhythm.     Pulses: Normal pulses.     Heart sounds: Normal heart sounds. No murmur heard. Pulmonary:     Effort: Pulmonary effort is normal. No respiratory distress.     Breath sounds: Normal breath sounds. No wheezing.  Skin:    General: Skin is warm and dry.     Capillary Refill: Capillary refill takes less than 2 seconds.  Neurological:     General: No focal deficit present.     Mental Status: He is alert and oriented to person, place, and time.     Cranial Nerves: No cranial nerve deficit.     Motor: No weakness.  Psychiatric:        Mood and Affect: Mood normal.        Behavior: Behavior normal.        Thought Content: Thought content normal.        Judgment: Judgment normal.        Assessment And Plan:     1. Essential hypertension Comments: Blood pressure is normal. Continue current medications - BMP8+eGFR  2. Abnormal glucose Comments: Stable, Diet controlled. - BMP8+eGFR - Hemoglobin A1c  3. Vitamin D deficiency Will check vitamin D level and supplement as needed.    Also encouraged to spend 15 minutes in the sun daily.  - VITAMIN D 25 Hydroxy (Vit-D Deficiency, Fractures)  4. Elevated cholesterol Comments: No current medications, encouraged to eat a low fat diet.  - Lipid panel  5. Class 2 severe obesity due to excess calories with serious comorbidity and body mass index (BMI) of 36.0 to 36.9 in adult [facility]) Comments: Encouraged to incorporate more physical activity and a healthy diet. He is encouraged to initially strive for BMI less than 30 to decrease cardiac risk. He is advised to exercise no less than 150 minutes per week.     Patient was given opportunity to ask questions. Patient verbalized understanding of the plan and was able to  repeat key elements of the plan. All questions were answered to their satisfaction.  [name], FNP   I, [name], FNP, have reviewed all documentation for this visit. The documentation on 02/11/21 for the exam, diagnosis, procedures, and orders are all accurate and complete.   IF YOU HAVE BEEN REFERRED TO A SPECIALIST, IT MAY TAKE 1-2 WEEKS TO SCHEDULE/PROCESS THE REFERRAL. IF YOU HAVE NOT HEARD FROM US/SPECIALIST IN TWO WEEKS, PLEASE GIVE [place] A CALL AT [phone] X [phone].   THE PATIENT IS ENCOURAGED TO PRACTICE SOCIAL  DISTANCING DUE TO THE COVID-19 PANDEMIC.

## 2021-02-12 LAB — LIPID PANEL
Chol/HDL Ratio: 4.6 ratio (ref 0.0–5.0)
Cholesterol, Total: 188 mg/dL (ref 100–199)
HDL: 41 mg/dL (ref >39)
LDL Chol Calc (NIH): 124 mg/dL — ABNORMAL HIGH (ref 0–99)
Triglycerides: 127 mg/dL (ref 0–149)
VLDL Cholesterol Cal: 23 mg/dL (ref 5–40)

## 2021-02-12 LAB — BMP8+EGFR
BUN/Creatinine Ratio: 10 (ref 9–20)
BUN: 12 mg/dL (ref 6–24)
CO2: 24 mmol/L (ref 20–29)
Calcium: 9.9 mg/dL (ref 8.7–10.2)
Chloride: 103 mmol/L (ref 96–106)
Creatinine, Ser: 1.23 mg/dL (ref 0.76–1.27)
Glucose: 90 mg/dL (ref 70–99)
Potassium: 4.2 mmol/L (ref 3.5–5.2)
Sodium: 141 mmol/L (ref 134–144)
eGFR: 72 mL/min/{1.73_m2} (ref >59)

## 2021-02-12 LAB — HEMOGLOBIN A1C
Est. average glucose Bld gHb Est-mCnc: 120 mg/dL
Hgb A1c MFr Bld: 5.8 % — ABNORMAL HIGH (ref 4.8–5.6)

## 2021-02-12 LAB — VITAMIN D 25 HYDROXY (VIT D DEFICIENCY, FRACTURES): Vit D, 25-Hydroxy: 51.2 ng/mL (ref 30.0–100.0)

## 2021-03-31 ENCOUNTER — Other Ambulatory Visit: Payer: Self-pay

## 2021-03-31 ENCOUNTER — Ambulatory Visit (INDEPENDENT_AMBULATORY_CARE_PROVIDER_SITE_OTHER): Payer: BC Managed Care – PPO

## 2021-03-31 DIAGNOSIS — Z23 Encounter for immunization: Secondary | ICD-10-CM

## 2021-03-31 NOTE — Progress Notes (Signed)
? ?  Covid-19 Vaccination Clinic ? ?Name:  [name] [name]    ?MRN: [id number] ?DOB: 01/29/1970 ? ?03/31/2021 ? ?[name] [name] was observed post Covid-19 immunization for 15 minutes without incident. He was provided with Vaccine Information Sheet and instruction to access the V-Safe system.  ? ?[name] [name] was instructed to call 911 with any severe reactions post vaccine: ?Difficulty breathing  ?Swelling of face and throat  ?A fast heartbeat  ?A bad rash all over body  ?Dizziness and weakness  ? ?Immunizations Administered   ? ? Name Date Dose VIS Date Route  ? Moderna Covid-19 vaccine Bivalent Booster 03/31/2021  2:22 PM 0.5 mL 08/16/2020 Intramuscular  ? Manufacturer: Moderna  ? Lot: [id number]  ? NDC: 80777-282-05  ? ?  ? ? ?

## 2021-08-17 ENCOUNTER — Encounter: Payer: Self-pay | Admitting: Nurse Practitioner

## 2021-08-17 ENCOUNTER — Ambulatory Visit (INDEPENDENT_AMBULATORY_CARE_PROVIDER_SITE_OTHER): Payer: BC Managed Care – PPO | Admitting: Nurse Practitioner

## 2021-08-17 VITALS — BP 120/68 | HR 83 | Temp 98.1°F | Ht 69.0 in | Wt 245.2 lb

## 2021-08-17 DIAGNOSIS — E7800 Pure hypercholesterolemia, unspecified: Secondary | ICD-10-CM

## 2021-08-17 DIAGNOSIS — Z0000 Encounter for general adult medical examination without abnormal findings: Secondary | ICD-10-CM

## 2021-08-17 DIAGNOSIS — Z6836 Body mass index (BMI) 36.0-36.9, adult: Secondary | ICD-10-CM

## 2021-08-17 DIAGNOSIS — Z23 Encounter for immunization: Secondary | ICD-10-CM | POA: Diagnosis not present

## 2021-08-17 DIAGNOSIS — E559 Vitamin D deficiency, unspecified: Secondary | ICD-10-CM

## 2021-08-17 DIAGNOSIS — I10 Essential (primary) hypertension: Secondary | ICD-10-CM | POA: Diagnosis not present

## 2021-08-17 DIAGNOSIS — R7309 Other abnormal glucose: Secondary | ICD-10-CM | POA: Diagnosis not present

## 2021-08-17 DIAGNOSIS — Z125 Encounter for screening for malignant neoplasm of prostate: Secondary | ICD-10-CM

## 2021-08-17 NOTE — Progress Notes (Addendum)
I,Victoria T Hamilton,acting as a Education administrator for [name], FNP.,have documented all relevant documentation on the behalf of [name], FNP,as directed by  [name], FNP while in the presence of [name], [place].   Subjective:     Patient ID: [name] [name] , male    DOB: 03/02/1970 , 51 y.o.   MRN: [id number]   Chief Complaint  Patient presents with   Annual Exam    HPI  Pt presents for HM.        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[place])    106m since 2007   Hemorrhoids    Hypertension    Prehypertension 03/09/2013   [place]ulmonary embolism ([place]    Rectal discomfort 05/10/2019     Family History  Problem Relation Age of Onset   Breast cancer Mother    Diabetes Father    COPD Father    Crohn's disease Other    Colon cancer Neg Hx    Rectal cancer Neg Hx    Stomach cancer Neg Hx      Current Outpatient Medications:    aspirin EC 81 MG tablet, Take 81 mg by mouth daily., Disp: , Rfl:    benzonatate (TESSALON PERLES) 100 MG capsule, Take 1 capsule (100 mg total) by mouth 3 (three) times daily as needed for cough., Disp: 30 capsule, Rfl: 1   metoprolol succinate (TOPROL-XL) 25 MG 24 hr tablet, TAKE 1 TABLET BY MOUTH EVERY DAY, Disp: 90 tablet, Rfl: 1   Multiple Vitamin (MULTIVITAMIN WITH MINERALS) TABS tablet, Take 1 tablet by mouth once a week., Disp: , Rfl:    pantoprazole (PROTONIX) 40 MG tablet, TAKE 1 TABLET BY MOUTH EVERY DAY, Disp: 90 tablet, Rfl: 1   Vitamin D, Ergocalciferol, (DRISDOL) 1.25 MG (50000 UNIT) CAPS capsule, TAKE 1 CAPSULE BY MOUTH ONE TIME PER WEEK, Disp: 12 capsule, Rfl: 1   guaiFENesin-dextromethorphan (ROBITUSSIN DM) 100-10 MG/5ML syrup, Take 5 mLs by mouth every 4 (four) hours as needed for cough. (Patient not taking: Reported on 02/11/2021), Disp: 118 mL, Rfl: 0   No Known Allergies   Men's preventive visit. Patient Health Questionnaire (PHQ-2) is  [place] [place] [facility]  PHQ-2 Total Score 0      Patient is on a regular diet. Exercising with walking stairs at work. He is working from home on Mon and 1/2 day Tuesday until school starts back. Marital status: Married. Relevant history for alcohol use is:  Social History   Substance and Sexual Activity  Alcohol Use No   Relevant history for tobacco use is:  Social History   Tobacco Use  Smoking Status Never  Smokeless Tobacco Never  .   Review of Systems  Constitutional: Negative.   HENT: Negative.    Eyes: Negative.   Respiratory: Negative.    Cardiovascular: Negative.   Gastrointestinal: Negative.   Endocrine: Negative.   Genitourinary: Negative.   Musculoskeletal: Negative.        Right foot pain when waking up in am, improves over the course of the day. Does wear dress shoes with a small heel often but has switched up to sketchers.   Skin: Negative.   Allergic/Immunologic: Negative.   Neurological: Negative.   Hematological: Negative.   Psychiatric/Behavioral: Negative.       Today's Vitals   08/17/21 0840  BP:  120/68  Pulse: 83  Temp: 98.1 F (36.7 C)  SpO2: 98%  Weight: 245 lb 3.2 oz (111.2 kg)  Height: _0  (1.753 m)  PainSc: 0-No pain   Body mass index is 36.21 kg/m.  Wt Readings from Last 3 Encounters:  08/17/21 245 lb 3.2 oz (111.2 kg)  02/11/21 248 lb 3.2 oz (112.6 kg)  01/27/21 250 lb (113.4 kg)    Objective:  Physical Exam Vitals reviewed.  Constitutional:      General: He is not in acute distress.    Appearance: Normal appearance. He is obese.  HENT:     Head: Normocephalic and atraumatic.     Right Ear: Tympanic membrane, ear canal and external ear normal. There is no impacted cerumen.     Left Ear: Tympanic membrane, ear canal and external ear normal. There is no impacted cerumen.     Nose:     Comments: Deferred - masked    Mouth/Throat:     Comments: Deferred - masked Eyes:      Extraocular Movements: Extraocular movements intact.     Conjunctiva/sclera: Conjunctivae normal.     Pupils: Pupils are equal, round, and reactive to light.  Cardiovascular:     Rate and Rhythm: Normal rate and regular rhythm.     Pulses: Normal pulses.     Heart sounds: Normal heart sounds. No murmur heard. Pulmonary:     Effort: Pulmonary effort is normal. No respiratory distress.     Breath sounds: Normal breath sounds. No wheezing.  Abdominal:     General: Abdomen is flat. Bowel sounds are normal. There is no distension.     Palpations: Abdomen is soft. There is no mass.     Tenderness: There is no abdominal tenderness.  Genitourinary:    Prostate: Normal.     Rectum: Guaiac result negative.  Musculoskeletal:        General: No swelling or tenderness. Normal range of motion.     Cervical back: Normal range of motion and neck supple. No tenderness.  Skin:    General: Skin is warm and dry.     Capillary Refill: Capillary refill takes less than 2 seconds.     Comments: Scarring and keloids noted to abdomen from previous surgery  Neurological:     General: No focal deficit present.     Mental Status: He is alert and oriented to person, place, and time.     Cranial Nerves: No cranial nerve deficit.     Motor: No weakness.  Psychiatric:        Mood and Affect: Mood normal.        Behavior: Behavior normal.        Thought Content: Thought content normal.        Judgment: Judgment normal.         Assessment And Plan:    1. Encounter for annual physical exam Behavior modifications discussed and diet history reviewed.   Pt will continue to exercise regularly and modify diet with low GI, plant based foods and decrease intake of processed foods.  Recommend intake of daily multivitamin, Vitamin D, and calcium.  Recommend colonoscopy for preventive screenings, as well as recommend immunizations that include influenza, TDAP, and Shingles  - CBC  2. Encounter for prostate cancer  screening - PSA  3. Class 2 severe obesity due to excess calories with serious comorbidity and body mass index (BMI) of 36.0 to 36.9 in adult [facility]) He is encouraged to strive for BMI less  than 30 to decrease cardiac risk. Advised to aim for at least 150 minutes of exercise per week.   4. Essential hypertension Comments: Blood pressure is well controlled, continue current medications and low salt diet. EKG done with NSR HR 76 - POCT Urinalysis Dipstick (81002) - EKG 12-Lead - Microalbumin / creatinine urine ratio  5. Abnormal glucose Comments: Stable, diet controlled. Continue eating a diet low in sugar and starches. Encouraged to incorporate more physical activity - POCT Urinalysis Dipstick (81002) - EKG 12-Lead - Microalbumin / creatinine urine ratio - Hemoglobin A1c  6. Elevated LDL cholesterol level Comments: Slightly increased LDL at last visit continue to eat a low fat diet - CMP14+EGFR - Lipid panel  7. Vitamin D deficiency Will check vitamin D level and supplement as needed.    Also encouraged to spend 15 minutes in the sun daily.  - VITAMIN D 25 Hydroxy (Vit-D Deficiency, Fractures)  8. Immunization due Shingrix #1 given in office - Zoster Recombinant (Shingrix )     Patient was given opportunity to ask questions. Patient verbalized understanding of the plan and was able to repeat key elements of the plan. All questions were answered to their satisfaction.   [name], FNP   I, [name], FNP, have reviewed all documentation for this visit. The documentation on 08/17/21 for the exam, diagnosis, procedures, and orders are all accurate and complete.   THE PATIENT IS ENCOURAGED TO PRACTICE SOCIAL DISTANCING DUE TO THE COVID-19 PANDEMIC.

## 2021-08-17 NOTE — Patient Instructions (Signed)
Health Maintenance, Male Adopting a healthy lifestyle and getting preventive care are important in promoting health and wellness. Ask your health care provider about: The right schedule for you to have regular tests and exams. Things you can do on your own to prevent diseases and keep yourself healthy. What should I know about diet, weight, and exercise? Eat a healthy diet  Eat a diet that includes plenty of vegetables, fruits, low-fat dairy products, and lean protein. Do not eat a lot of foods that are high in solid fats, added sugars, or sodium. Maintain a healthy weight Body mass index (BMI) is a measurement that can be used to identify possible weight problems. It estimates body fat based on height and weight. Your health care provider can help determine your BMI and help you achieve or maintain a healthy weight. Get regular exercise Get regular exercise. This is one of the most important things you can do for your health. Most adults should: Exercise for at least 150 minutes each week. The exercise should increase your heart rate and make you sweat (moderate-intensity exercise). Do strengthening exercises at least twice a week. This is in addition to the moderate-intensity exercise. Spend less time sitting. Even light physical activity can be beneficial. Watch cholesterol and blood lipids Have your blood tested for lipids and cholesterol at 50 years of age, then have this test every 5 years. You may need to have your cholesterol levels checked more often if: Your lipid or cholesterol levels are high. You are older than 50 years of age. You are at high risk for heart disease. What should I know about cancer screening? Many types of cancers can be detected early and may often be prevented. Depending on your health history and family history, you may need to have cancer screening at various ages. This may include screening for: Colorectal cancer. Prostate cancer. Skin cancer. Lung  cancer. What should I know about heart disease, diabetes, and high blood pressure? Blood pressure and heart disease High blood pressure causes heart disease and increases the risk of stroke. This is more likely to develop in people who have high blood pressure readings or are overweight. Talk with your health care provider about your target blood pressure readings. Have your blood pressure checked: Every 3-5 years if you are 18-39 years of age. Every year if you are 40 years old or older. If you are between the ages of 65 and 75 and are a current or former smoker, ask your health care provider if you should have a one-time screening for abdominal aortic aneurysm (AAA). Diabetes Have regular diabetes screenings. This checks your fasting blood sugar level. Have the screening done: Once every three years after age 45 if you are at a normal weight and have a low risk for diabetes. More often and at a younger age if you are overweight or have a high risk for diabetes. What should I know about preventing infection? Hepatitis B If you have a higher risk for hepatitis B, you should be screened for this virus. Talk with your health care provider to find out if you are at risk for hepatitis B infection. Hepatitis C Blood testing is recommended for: Everyone born from 1945 through 1965. Anyone with known risk factors for hepatitis C. Sexually transmitted infections (STIs) You should be screened each year for STIs, including gonorrhea and chlamydia, if: You are sexually active and are younger than 50 years of age. You are older than 50 years of age and your   health care provider tells you that you are at risk for this type of infection. Your sexual activity has changed since you were last screened, and you are at increased risk for chlamydia or gonorrhea. Ask your health care provider if you are at risk. Ask your health care provider about whether you are at high risk for HIV. Your health care provider  may recommend a prescription medicine to help prevent HIV infection. If you choose to take medicine to prevent HIV, you should first get tested for HIV. You should then be tested every 3 months for as long as you are taking the medicine. Follow these instructions at home: Alcohol use Do not drink alcohol if your health care provider tells you not to drink. If you drink alcohol: Limit how much you have to 0-2 drinks a day. Know how much alcohol is in your drink. In the [place] one drink equals one 12 oz bottle of beer (355 mL), one 5 oz glass of wine (148 mL), or one 1 oz glass of hard liquor (44 mL). Lifestyle Do not use any products that contain nicotine or tobacco. These products include cigarettes, chewing tobacco, and vaping devices, such as e-cigarettes. If you need help quitting, ask your health care provider. Do not use street drugs. Do not share needles. Ask your health care provider for help if you need support or information about quitting drugs. General instructions Schedule regular health, dental, and eye exams. Stay current with your vaccines. Tell your health care provider if: You often feel depressed. You have ever been abused or do not feel safe at home. Summary Adopting a healthy lifestyle and getting preventive care are important in promoting health and wellness. Follow your health care provider's instructions about healthy diet, exercising, and getting tested or screened for diseases. Follow your health care provider's instructions on monitoring your cholesterol and blood pressure. This information is not intended to replace advice given to you by your health care provider. Make sure you discuss any questions you have with your health care provider. Document Revised: 05/12/2020 Document Reviewed: 05/12/2020 Elsevier Patient Education  2023 [employer]  

## 2021-08-18 LAB — CMP14+EGFR
ALT: 21 IU/L (ref 0–44)
AST: 19 IU/L (ref 0–40)
Albumin/Globulin Ratio: 1.5 (ref 1.2–2.2)
Albumin: 4.3 g/dL (ref 4.1–5.1)
Alkaline Phosphatase: 82 IU/L (ref 44–121)
BUN/Creatinine Ratio: 8 — ABNORMAL LOW (ref 9–20)
BUN: 11 mg/dL (ref 6–24)
Bilirubin Total: 1 mg/dL (ref 0.0–1.2)
CO2: 26 mmol/L (ref 20–29)
Calcium: 9.7 mg/dL (ref 8.7–10.2)
Chloride: 102 mmol/L (ref 96–106)
Creatinine, Ser: 1.33 mg/dL — ABNORMAL HIGH (ref 0.76–1.27)
Globulin, Total: 2.8 g/dL (ref 1.5–4.5)
Glucose: 94 mg/dL (ref 70–99)
Potassium: 4.3 mmol/L (ref 3.5–5.2)
Sodium: 139 mmol/L (ref 134–144)
Total Protein: 7.1 g/dL (ref 6.0–8.5)
eGFR: 65 mL/min/{1.73_m2} (ref >59)

## 2021-08-18 LAB — LIPID PANEL
Chol/HDL Ratio: 4.7 ratio (ref 0.0–5.0)
Cholesterol, Total: 183 mg/dL (ref 100–199)
HDL: 39 mg/dL — ABNORMAL LOW (ref >39)
LDL Chol Calc (NIH): 121 mg/dL — ABNORMAL HIGH (ref 0–99)
Triglycerides: 130 mg/dL (ref 0–149)
VLDL Cholesterol Cal: 23 mg/dL (ref 5–40)

## 2021-08-18 LAB — CBC
Hematocrit: 46.1 % (ref 37.5–51.0)
Hemoglobin: 14.5 g/dL (ref 13.0–17.7)
MCH: 26.5 pg — ABNORMAL LOW (ref 26.6–33.0)
MCHC: 31.5 g/dL (ref 31.5–35.7)
MCV: 84 fL (ref 79–97)
Platelets: 297 10*3/uL (ref 150–450)
RBC: 5.48 x10E6/uL (ref 4.14–5.80)
RDW: 13.4 % (ref 11.6–15.4)
WBC: 4.2 10*3/uL (ref 3.4–10.8)

## 2021-08-18 LAB — HEMOGLOBIN A1C
Est. average glucose Bld gHb Est-mCnc: 120 mg/dL
Hgb A1c MFr Bld: 5.8 % — ABNORMAL HIGH (ref 4.8–5.6)

## 2021-08-18 LAB — VITAMIN D 25 HYDROXY (VIT D DEFICIENCY, FRACTURES): Vit D, 25-Hydroxy: 40.1 ng/mL (ref 30.0–100.0)

## 2021-08-18 LAB — PSA: Prostate Specific Ag, Serum: 2.8 ng/mL (ref 0.0–4.0)

## 2021-08-20 LAB — MICROALBUMIN / CREATININE URINE RATIO
Creatinine, Urine: 289 mg/dL
Microalb/Creat Ratio: 10 mg/g creat (ref 0–29)
Microalbumin, Urine: 27.9 ug/mL

## 2021-09-09 ENCOUNTER — Other Ambulatory Visit: Payer: Self-pay | Admitting: Nurse Practitioner

## 2021-09-09 DIAGNOSIS — K219 Gastro-esophageal reflux disease without esophagitis: Secondary | ICD-10-CM

## 2021-09-15 ENCOUNTER — Ambulatory Visit: Payer: BC Managed Care – PPO

## 2021-09-29 ENCOUNTER — Ambulatory Visit (INDEPENDENT_AMBULATORY_CARE_PROVIDER_SITE_OTHER): Payer: BC Managed Care – PPO

## 2021-09-29 VITALS — BP 120/80 | HR 79 | Temp 98.0°F | Ht 69.0 in | Wt 245.0 lb

## 2021-09-29 DIAGNOSIS — Z23 Encounter for immunization: Secondary | ICD-10-CM

## 2021-09-29 NOTE — Progress Notes (Signed)
Patient presents today for a flu vaccine. YL,RMA

## 2021-10-04 ENCOUNTER — Other Ambulatory Visit: Payer: Self-pay | Admitting: Nurse Practitioner

## 2021-10-19 ENCOUNTER — Encounter: Payer: Self-pay | Admitting: Nurse Practitioner

## 2021-10-19 ENCOUNTER — Ambulatory Visit: Payer: BC Managed Care – PPO | Admitting: Nurse Practitioner

## 2021-10-19 VITALS — BP 130/90 | HR 81 | Temp 98.3°F | Ht 69.0 in | Wt 244.0 lb

## 2021-10-19 DIAGNOSIS — Z23 Encounter for immunization: Secondary | ICD-10-CM

## 2021-10-19 DIAGNOSIS — E559 Vitamin D deficiency, unspecified: Secondary | ICD-10-CM

## 2021-10-19 DIAGNOSIS — I10 Essential (primary) hypertension: Secondary | ICD-10-CM | POA: Diagnosis not present

## 2021-10-19 MED ORDER — VITAMIN D (ERGOCALCIFEROL) 1.25 MG (50000 UNIT) PO CAPS: ORAL_CAPSULE | ORAL | 1 refills | Status: DC

## 2021-10-19 NOTE — Progress Notes (Signed)
I,[name] [name],acting as a Education administrator for [employer], FNP.,have documented all relevant documentation on the behalf of [name] Brine, FNP,as directed by  [name] Brine, FNP while in the presence of [name] [name], [place].  Subjective:     Patient ID: [name] [name] , male    DOB: 06-23-1970 , 51 y.o.   MRN: [id number]   Chief Complaint  Patient presents with   Hypertension    HPI  Patient here for a f/u on his blood pressure. He did not take his medications this morning "I did not think I needed to take my medication when I came to the doctor"  Wt Readings from Last 3 Encounters: 10/19/21 : 244 lb (110.7 kg) 09/29/21 : 245 lb (111.1 kg) 08/17/21 : 245 lb 3.2 oz (111.2 kg)    Hypertension This is a chronic problem. The current episode started more than 1 year ago. The problem is unchanged. The problem is controlled. Pertinent negatives include no anxiety, blurred vision or headaches. There are no associated agents to hypertension. Risk factors for coronary artery disease include obesity and sedentary lifestyle. Past treatments include beta blockers. The current treatment provides no improvement. There are no compliance problems.  There is no history of angina. There is no history of chronic renal disease.     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[place])    52m since 2007   Hemorrhoids    Hypertension    Prehypertension 03/09/2013   [place]    Rectal discomfort 05/10/2019     Family History  Problem Relation Age of Onset   Breast cancer Mother    Diabetes Father    COPD Father    Crohn's disease Other    Colon cancer Neg Hx    Rectal cancer Neg Hx    Stomach cancer Neg Hx      Current Outpatient Medications:    aspirin EC 81 MG tablet, Take 81 mg by mouth daily., Disp: , Rfl:    guaiFENesin-dextromethorphan (ROBITUSSIN DM) 100-10 MG/5ML syrup, Take 5 mLs by mouth every 4 (four) hours as  needed for cough., Disp: 118 mL, Rfl: 0   metoprolol succinate (TOPROL-XL) 25 MG 24 hr tablet, TAKE 1 TABLET BY MOUTH EVERY DAY, Disp: 90 tablet, Rfl: 1   Multiple Vitamin (MULTIVITAMIN WITH MINERALS) TABS tablet, Take 1 tablet by mouth once a week., Disp: , Rfl:    pantoprazole (PROTONIX) 40 MG tablet, TAKE 1 TABLET BY MOUTH EVERY DAY, Disp: 90 tablet, Rfl: 1   Vitamin D, Ergocalciferol, (DRISDOL) 1.25 MG (50000 UNIT) CAPS capsule, TAKE 1 CAPSULE BY MOUTH ONE TIME PER WEEK, Disp: 12 capsule, Rfl: 1   No Known Allergies   Review of Systems  Constitutional: Negative.   Eyes:  Negative for blurred vision.  Respiratory: Negative.    Cardiovascular: Negative.   Gastrointestinal: Negative.   Neurological: Negative.  Negative for headaches.  Psychiatric/Behavioral: Negative.       Today's Vitals   10/19/21 0830 10/19/21 0850  BP: (!) 120/90 (!) 130/90  Pulse: 81   Temp: 98.3 F (36.8 C)   TempSrc: Oral   Weight: 244 lb (110.7 kg)   Height: 5' 9"  (1.753 m)    Body mass index is 36.03 kg/m.   Objective:  Physical Exam Vitals reviewed.  Constitutional:      General: He is not in acute distress.  Appearance: Normal appearance. He is obese.  Cardiovascular:     Rate and Rhythm: Normal rate and regular rhythm.     Pulses: Normal pulses.     Heart sounds: Normal heart sounds. No murmur heard. Pulmonary:     Effort: Pulmonary effort is normal. No respiratory distress.     Breath sounds: Normal breath sounds. No wheezing.  Skin:    General: Skin is warm and dry.  Neurological:     General: No focal deficit present.     Mental Status: He is alert and oriented to person, place, and time.     Cranial Nerves: No cranial nerve deficit.     Motor: No weakness.  Psychiatric:        Mood and Affect: Mood normal.        Behavior: Behavior normal.        Thought Content: Thought content normal.        Judgment: Judgment normal.         Assessment And Plan:     1. Essential  hypertension Comments: Blood pressure is slightly elevated, he did not take his medications today. Stressed importance of adherence to medications even with appts.   2. Vitamin D deficiency Comments: Vitamin d level is controlled, will refill his vitamin D - Vitamin D, Ergocalciferol, (DRISDOL) 1.25 MG (50000 UNIT) CAPS capsule; TAKE 1 CAPSULE BY MOUTH ONE TIME PER WEEK  Dispense: 12 capsule; Refill: 1  3. Need for shingles vaccine Comments: 2nd Shingrix vaccine given in office.  - Zoster Recombinant (Shingrix )     Patient was given opportunity to ask questions. Patient verbalized understanding of the plan and was able to repeat key elements of the plan. All questions were answered to their satisfaction.  [name] Brine, FNP   I, [name] Brine, FNP, have reviewed all documentation for this visit. The documentation on 10/19/21 for the exam, diagnosis, procedures, and orders are all accurate and complete.   IF YOU HAVE BEEN REFERRED TO A SPECIALIST, IT MAY TAKE 1-2 WEEKS TO SCHEDULE/PROCESS THE REFERRAL. IF YOU HAVE NOT HEARD FROM US/SPECIALIST IN TWO WEEKS, PLEASE GIVE [place] A CALL AT [phone].   THE PATIENT IS ENCOURAGED TO PRACTICE SOCIAL DISTANCING DUE TO THE COVID-19 PANDEMIC.

## 2021-10-19 NOTE — Patient Instructions (Signed)
Hypertension, Adult High blood pressure (hypertension) is when the force of blood pumping through the arteries is too strong. The arteries are the blood vessels that carry blood from the heart throughout the body. Hypertension forces the heart to work harder to pump blood and may cause arteries to become narrow or stiff. Untreated or uncontrolled hypertension can lead to a heart attack, heart failure, a stroke, kidney disease, and other problems. A blood pressure reading consists of a higher number over a lower number. Ideally, your blood pressure should be below 120/80. The first ("top") number is called the systolic pressure. It is a measure of the pressure in your arteries as your heart beats. The second ("bottom") number is called the diastolic pressure. It is a measure of the pressure in your arteries as the heart relaxes. What are the causes? The exact cause of this condition is not known. There are some conditions that result in high blood pressure. What increases the risk? Certain factors may make you more likely to develop high blood pressure. Some of these risk factors are under your control, including: Smoking. Not getting enough exercise or physical activity. Being overweight. Having too much fat, sugar, calories, or salt (sodium) in your diet. Drinking too much alcohol. Other risk factors include: Having a personal history of heart disease, diabetes, high cholesterol, or kidney disease. Stress. Having a family history of high blood pressure and high cholesterol. Having obstructive sleep apnea. Age. The risk increases with age. What are the signs or symptoms? High blood pressure may not cause symptoms. Very high blood pressure (hypertensive crisis) may cause: Headache. Fast or irregular heartbeats (palpitations). Shortness of breath. Nosebleed. Nausea and vomiting. Vision changes. Severe chest pain, dizziness, and seizures. How is this diagnosed? This condition is diagnosed by  measuring your blood pressure while you are seated, with your arm resting on a flat surface, your legs uncrossed, and your feet flat on the floor. The cuff of the blood pressure monitor will be placed directly against the skin of your upper arm at the level of your heart. Blood pressure should be measured at least twice using the same arm. Certain conditions can cause a difference in blood pressure between your right and left arms. If you have a high blood pressure reading during one visit or you have normal blood pressure with other risk factors, you may be asked to: Return on a different day to have your blood pressure checked again. Monitor your blood pressure at home for 1 week or longer. If you are diagnosed with hypertension, you may have other blood or imaging tests to help your health care provider understand your overall risk for other conditions. How is this treated? This condition is treated by making healthy lifestyle changes, such as eating healthy foods, exercising more, and reducing your alcohol intake. You may be referred for counseling on a healthy diet and physical activity. Your health care provider may prescribe medicine if lifestyle changes are not enough to get your blood pressure under control and if: Your systolic blood pressure is above 130. Your diastolic blood pressure is above 80. Your personal target blood pressure may vary depending on your medical conditions, your age, and other factors. Follow these instructions at home: Eating and drinking  Eat a diet that is high in fiber and potassium, and low in sodium, added sugar, and fat. An example of this eating plan is called the DASH diet. DASH stands for Dietary Approaches to Stop Hypertension. To eat this way: Eat   plenty of fresh fruits and vegetables. Try to fill one half of your plate at each meal with fruits and vegetables. Eat whole grains, such as whole-wheat pasta, brown rice, or whole-grain bread. Fill about one  fourth of your plate with whole grains. Eat or drink low-fat dairy products, such as skim milk or low-fat yogurt. Avoid fatty cuts of meat, processed or cured meats, and poultry with skin. Fill about one fourth of your plate with lean proteins, such as fish, chicken without skin, beans, eggs, or tofu. Avoid pre-made and processed foods. These tend to be higher in sodium, added sugar, and fat. Reduce your daily sodium intake. Many people with hypertension should eat less than 1,500 mg of sodium a day. Do not drink alcohol if: Your health care provider tells you not to drink. You are pregnant, may be pregnant, or are planning to become pregnant. If you drink alcohol: Limit how much you have to: 0-1 drink a day for women. 0-2 drinks a day for men. Know how much alcohol is in your drink. In the [place] one drink equals one 12 oz bottle of beer (355 mL), one 5 oz glass of wine (148 mL), or one 1 oz glass of hard liquor (44 mL). Lifestyle  Work with your health care provider to maintain a healthy body weight or to lose weight. Ask what an ideal weight is for you. Get at least 30 minutes of exercise that causes your heart to beat faster (aerobic exercise) most days of the week. Activities may include walking, swimming, or biking. Include exercise to strengthen your muscles (resistance exercise), such as Pilates or lifting weights, as part of your weekly exercise routine. Try to do these types of exercises for 30 minutes at least 3 days a week. Do not use any products that contain nicotine or tobacco. These products include cigarettes, chewing tobacco, and vaping devices, such as e-cigarettes. If you need help quitting, ask your health care provider. Monitor your blood pressure at home as told by your health care provider. Keep all follow-up visits. This is important. Medicines Take over-the-counter and prescription medicines only as told by your health care provider. Follow directions carefully. Blood  pressure medicines must be taken as prescribed. Do not skip doses of blood pressure medicine. Doing this puts you at risk for problems and can make the medicine less effective. Ask your health care provider about side effects or reactions to medicines that you should watch for. Contact a health care provider if you: Think you are having a reaction to a medicine you are taking. Have headaches that keep coming back (recurring). Feel dizzy. Have swelling in your ankles. Have trouble with your vision. Get help right away if you: Develop a severe headache or confusion. Have unusual weakness or numbness. Feel faint. Have severe pain in your chest or abdomen. Vomit repeatedly. Have trouble breathing. These symptoms may be an emergency. Get help right away. Call 911. Do not wait to see if the symptoms will go away. Do not drive yourself to the hospital. Summary Hypertension is when the force of blood pumping through your arteries is too strong. If this condition is not controlled, it may put you at risk for serious complications. Your personal target blood pressure may vary depending on your medical conditions, your age, and other factors. For most people, a normal blood pressure is less than 120/80. Hypertension is treated with lifestyle changes, medicines, or a combination of both. Lifestyle changes include losing weight, eating a healthy,   low-sodium diet, exercising more, and limiting alcohol. This information is not intended to replace advice given to you by your health care provider. Make sure you discuss any questions you have with your health care provider. Document Revised: 10/28/2020 Document Reviewed: 10/28/2020 Elsevier Patient Education  2023 Elsevier Inc.  

## 2022-02-22 ENCOUNTER — Ambulatory Visit: Payer: BC Managed Care – PPO | Admitting: Nurse Practitioner

## 2022-02-22 ENCOUNTER — Encounter: Payer: Self-pay | Admitting: Nurse Practitioner

## 2022-02-22 VITALS — BP 136/80 | HR 97 | Temp 98.3°F | Ht 69.0 in | Wt 251.4 lb

## 2022-02-22 DIAGNOSIS — I10 Essential (primary) hypertension: Secondary | ICD-10-CM | POA: Diagnosis not present

## 2022-02-22 DIAGNOSIS — E559 Vitamin D deficiency, unspecified: Secondary | ICD-10-CM | POA: Diagnosis not present

## 2022-02-22 DIAGNOSIS — R5383 Other fatigue: Secondary | ICD-10-CM | POA: Diagnosis not present

## 2022-02-22 DIAGNOSIS — R7309 Other abnormal glucose: Secondary | ICD-10-CM

## 2022-02-22 NOTE — Patient Instructions (Addendum)
Hypertension, Adult ?Hypertension is another name for high blood pressure. High blood pressure forces your heart to work harder to pump blood. This can cause problems over time. ?There are two numbers in a blood pressure reading. There is a top number (systolic) over a bottom number (diastolic). It is best to have a blood pressure that is below 120/80. ?What are the causes? ?The cause of this condition is not known. Some other conditions can lead to high blood pressure. ?What increases the risk? ?Some lifestyle factors can make you more likely to develop high blood pressure: ?Smoking. ?Not getting enough exercise or physical activity. ?Being overweight. ?Having too much fat, sugar, calories, or salt (sodium) in your diet. ?Drinking too much alcohol. ?Other risk factors include: ?Having any of these conditions: ?Heart disease. ?Diabetes. ?High cholesterol. ?Kidney disease. ?Obstructive sleep apnea. ?Having a family history of high blood pressure and high cholesterol. ?Age. The risk increases with age. ?Stress. ?What are the signs or symptoms? ?High blood pressure may not cause symptoms. Very high blood pressure (hypertensive crisis) may cause: ?Headache. ?Fast or uneven heartbeats (palpitations). ?Shortness of breath. ?Nosebleed. ?Vomiting or feeling like you may vomit (nauseous). ?Changes in how you see. ?Very bad chest pain. ?Feeling dizzy. ?Seizures. ?How is this treated? ?This condition is treated by making healthy lifestyle changes, such as: ?Eating healthy foods. ?Exercising more. ?Drinking less alcohol. ?Your doctor may prescribe medicine if lifestyle changes do not help enough and if: ?Your top number is above 130. ?Your bottom number is above 80. ?Your personal target blood pressure may vary. ?Follow these instructions at home: ?Eating and drinking ? ?If told, follow the DASH eating plan. To follow this plan: ?Fill one half of your plate at each meal with fruits and vegetables. ?Fill one fourth of your plate  at each meal with whole grains. Whole grains include whole-wheat pasta, brown rice, and whole-grain bread. ?Eat or drink low-fat dairy products, such as skim milk or low-fat yogurt. ?Fill one fourth of your plate at each meal with low-fat (lean) proteins. Low-fat proteins include fish, chicken without skin, eggs, beans, and tofu. ?Avoid fatty meat, cured and processed meat, or chicken with skin. ?Avoid pre-made or processed food. ?Limit the amount of salt in your diet to less than 1,500 mg each day. ?Do not drink alcohol if: ?Your doctor tells you not to drink. ?You are pregnant, may be pregnant, or are planning to become pregnant. ?If you drink alcohol: ?Limit how much you have to: ?0-1 drink a day for women. ?0-2 drinks a day for men. ?Know how much alcohol is in your drink. In the [place] one drink equals one 12 oz bottle of beer (355 mL), one 5 oz glass of wine (148 mL), or one 1? oz glass of hard liquor (44 mL). ?Lifestyle ? ?Work with your doctor to stay at a healthy weight or to lose weight. Ask your doctor what the best weight is for you. ?Get at least 30 minutes of exercise that causes your heart to beat faster (aerobic exercise) most days of the week. This may include walking, swimming, or biking. ?Get at least 30 minutes of exercise that strengthens your muscles (resistance exercise) at least 3 days a week. This may include lifting weights or doing Pilates. ?Do not smoke or use any products that contain nicotine or tobacco. If you need help quitting, ask your doctor. ?Check your blood pressure at home as told by your doctor. ?Keep all follow-up visits. ?Medicines ?Take over-the-counter and prescription medicines   only as told by your doctor. Follow directions carefully. Do not skip doses of blood pressure medicine. The medicine does not work as well if you skip doses. Skipping doses also puts you at risk for problems. Ask your doctor about side effects or reactions to medicines that you should watch  for. Contact a doctor if: You think you are having a reaction to the medicine you are taking. You have headaches that keep coming back. You feel dizzy. You have swelling in your ankles. You have trouble with your vision. Get help right away if: You get a very bad headache. You start to feel mixed up (confused). You feel weak or numb. You feel faint. You have very bad pain in your: Chest. Belly (abdomen). You vomit more than once. You have trouble breathing. These symptoms may be an emergency. Get help right away. Call 911. Do not wait to see if the symptoms will go away. Do not drive yourself to the hospital. Summary Hypertension is another name for high blood pressure. High blood pressure forces your heart to work harder to pump blood. For most people, a normal blood pressure is less than 120/80. Making healthy choices can help lower blood pressure. If your blood pressure does not get lower with healthy choices, you may need to take medicine. This information is not intended to replace advice given to you by your health care provider. Make sure you discuss any questions you have with your health care provider. Document Revised: 10/09/2020 Document Reviewed: 10/09/2020 Elsevier Patient Education  [place] can Restora probiotic with [employer] - take daily Call your GI doctor for further evaluation

## 2022-02-22 NOTE — Progress Notes (Signed)
Barnet Glasgow Martin,acting as a Education administrator for [name], FNP.,have documented all relevant documentation on the behalf of [name], FNP,as directed by  [name], FNP while in the presence of [name], [place].    Subjective:     Patient ID: [name] [name] , male    DOB: December 22, 1970 , 52 y.o.   MRN: [id number]   Chief Complaint  Patient presents with   Hypertension    HPI  Patient here for a f/u on his blood pressure and dm follow up. Patient state he has been really tired and having many bowel movements sometimes are lose. Patient states he has not taken any medications today. 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   BP Readings from Last 3 Encounters: 02/22/22 : (!) 140/80 10/19/21 : (!) 130/90 09/29/21 : 120/80  Wt Readings from Last 3 Encounters: 02/22/22 : 251 lb 6.4 oz (114 kg) 10/19/21 : 244 lb (110.7 kg) 09/29/21 : 245 lb (111.1 kg)  Admits he needs to increase his activity.  He is also having frequent bowel movements switching between constipation and loose bowels.    Hypertension This is a chronic problem. The current episode started more than 1 year ago. The problem is unchanged. The problem is controlled. Pertinent negatives include no anxiety, blurred vision or headaches. There are no associated agents to hypertension. Risk factors for coronary artery disease include obesity and sedentary lifestyle. Past treatments include beta blockers. The current treatment provides no improvement. There are no compliance problems.  There is no history of angina. There is no history of chronic renal disease.     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[place])    38m since 2007   Hemorrhoids    Hypertension    Prehypertension 03/09/2013   [place]    Rectal discomfort 05/10/2019     Family History  Problem Relation Age of Onset   Breast cancer Mother    Diabetes Father     COPD Father    Crohn's disease Other    Colon cancer Neg Hx    Rectal cancer Neg Hx    Stomach cancer Neg Hx      Current Outpatient Medications:    aspirin EC 81 MG tablet, Take 81 mg by mouth daily., Disp: , Rfl:    metoprolol succinate (TOPROL-XL) 25 MG 24 hr tablet, TAKE 1 TABLET BY MOUTH EVERY DAY, Disp: 90 tablet, Rfl: 1   Multiple Vitamin (MULTIVITAMIN WITH MINERALS) TABS tablet, Take 1 tablet by mouth once a week., Disp: , Rfl:    pantoprazole (PROTONIX) 40 MG tablet, TAKE 1 TABLET BY MOUTH EVERY DAY, Disp: 90 tablet, Rfl: 1   Vitamin D, Ergocalciferol, (DRISDOL) 1.25 MG (50000 UNIT) CAPS capsule, TAKE 1 CAPSULE BY MOUTH ONE TIME PER WEEK, Disp: 12 capsule, Rfl: 1   guaiFENesin-dextromethorphan (ROBITUSSIN DM) 100-10 MG/5ML syrup, Take 5 mLs by mouth every 4 (four) hours as needed for cough. (Patient not taking: Reported on 02/22/2022), Disp: 118 mL, Rfl: 0   No Known Allergies   Review of Systems  Constitutional: Negative.   Eyes:  Negative for blurred vision.  Respiratory: Negative.    Cardiovascular: Negative.   Gastrointestinal: Negative.   Neurological: Negative.  Negative for headaches.  Psychiatric/Behavioral: Negative.       Today's Vitals   02/22/22 1020 02/22/22 1109  BP: (!) 140/80 136/80  Pulse: 97   Temp: 98.3 F (36.8 C)   TempSrc: Oral   Weight: 251 lb 6.4 oz (114 kg)   Height: '5\' 9"'$  (1.753 m)   PainSc: 0-No pain    Body mass index is 37.13 kg/m.  Wt Readings from Last 3 Encounters:  02/22/22 251 lb 6.4 oz (114 kg)  10/19/21 244 lb (110.7 kg)  09/29/21 245 lb (111.1 kg)    Objective:  Physical Exam Vitals reviewed.  Constitutional:      General: He is not in acute distress.    Appearance: Normal appearance.  Cardiovascular:     Rate and Rhythm: Normal rate and regular rhythm.     Pulses: Normal pulses.     Heart sounds: Normal heart sounds. No murmur heard. Pulmonary:     Effort: Pulmonary effort is normal. No respiratory distress.      Breath sounds: Normal breath sounds. No wheezing.  Skin:    General: Skin is warm and dry.     Capillary Refill: Capillary refill takes less than 2 seconds.  Neurological:     General: No focal deficit present.     Mental Status: He is alert and oriented to person, place, and time.         Assessment And Plan:     1. Essential hypertension Comments: Blood pressure is fairly controlled discussed lifestyle modifications to include increasing physical activity to 150 minutes/week. - BMP8+eGFR  2. Abnormal glucose Comments: Stable, continue focusing on diet low in sugar and starches. - Hemoglobin A1c  3. Vitamin D deficiency Will check vitamin D level and supplement as needed.    Also encouraged to spend 15 minutes in the sun daily.  - VITAMIN D 25 Hydroxy (Vit-D Deficiency, Fractures)  4. Other fatigue Comments: He is working 2 jobs.  Will check for any metabolic causes.  May be related to deconditioning as well, encouraged to increase physical activity. - Iron, TIBC and Ferritin Panel - Vitamin B12 - CBC no Diff - TSH     Patient was given opportunity to ask questions. Patient verbalized understanding of the plan and was able to repeat key elements of the plan. All questions were answered to their satisfaction.  [name], FNP   I, [name], FNP, have reviewed all documentation for this visit. The documentation on 02/22/22 for the exam, diagnosis, procedures, and orders are all accurate and complete.   IF YOU HAVE BEEN REFERRED TO A SPECIALIST, IT MAY TAKE 1-2 WEEKS TO SCHEDULE/PROCESS THE REFERRAL. IF YOU HAVE NOT HEARD FROM US/SPECIALIST IN TWO WEEKS, PLEASE GIVE [place] A CALL AT [phone].   THE PATIENT IS ENCOURAGED TO PRACTICE SOCIAL DISTANCING DUE TO THE COVID-19 PANDEMIC.

## 2022-02-23 LAB — CBC
Hematocrit: 45.2 % (ref 37.5–51.0)
Hemoglobin: 14.2 g/dL (ref 13.0–17.7)
MCH: 26.5 pg — ABNORMAL LOW (ref 26.6–33.0)
MCHC: 31.4 g/dL — ABNORMAL LOW (ref 31.5–35.7)
MCV: 85 fL (ref 79–97)
Platelets: 285 10*3/uL (ref 150–450)
RBC: 5.35 x10E6/uL (ref 4.14–5.80)
RDW: 13.8 % (ref 11.6–15.4)
WBC: 4.5 10*3/uL (ref 3.4–10.8)

## 2022-02-23 LAB — BMP8+EGFR
BUN/Creatinine Ratio: 8 — ABNORMAL LOW (ref 9–20)
BUN: 10 mg/dL (ref 6–24)
CO2: 24 mmol/L (ref 20–29)
Calcium: 9.6 mg/dL (ref 8.7–10.2)
Chloride: 100 mmol/L (ref 96–106)
Creatinine, Ser: 1.18 mg/dL (ref 0.76–1.27)
Glucose: 87 mg/dL (ref 70–99)
Potassium: 4.6 mmol/L (ref 3.5–5.2)
Sodium: 138 mmol/L (ref 134–144)
eGFR: 75 mL/min/{1.73_m2} (ref >59)

## 2022-02-23 LAB — IRON,TIBC AND FERRITIN PANEL
Ferritin: 491 ng/mL — ABNORMAL HIGH (ref 30–400)
Iron Saturation: 32 % (ref 15–55)
Iron: 83 ug/dL (ref 38–169)
Total Iron Binding Capacity: 262 ug/dL (ref 250–450)
UIBC: 179 ug/dL (ref 111–343)

## 2022-02-23 LAB — TSH: TSH: 1.31 u[IU]/mL (ref 0.450–4.500)

## 2022-02-23 LAB — VITAMIN B12: Vitamin B-12: 714 pg/mL (ref 232–1245)

## 2022-02-23 LAB — HEMOGLOBIN A1C
Est. average glucose Bld gHb Est-mCnc: 120 mg/dL
Hgb A1c MFr Bld: 5.8 % — ABNORMAL HIGH (ref 4.8–5.6)

## 2022-02-23 LAB — VITAMIN D 25 HYDROXY (VIT D DEFICIENCY, FRACTURES): Vit D, 25-Hydroxy: 35.6 ng/mL (ref 30.0–100.0)

## 2022-03-27 ENCOUNTER — Other Ambulatory Visit: Payer: Self-pay | Admitting: Nurse Practitioner

## 2022-03-29 ENCOUNTER — Other Ambulatory Visit: Payer: Self-pay | Admitting: Nurse Practitioner

## 2022-03-29 DIAGNOSIS — K219 Gastro-esophageal reflux disease without esophagitis: Secondary | ICD-10-CM

## 2022-05-25 ENCOUNTER — Encounter: Payer: Self-pay | Admitting: Nurse Practitioner

## 2022-05-25 ENCOUNTER — Ambulatory Visit: Payer: BC Managed Care – PPO | Admitting: Nurse Practitioner

## 2022-05-25 VITALS — BP 120/82 | HR 80 | Temp 98.8°F | Ht 69.0 in | Wt 250.8 lb

## 2022-05-25 DIAGNOSIS — M25521 Pain in right elbow: Secondary | ICD-10-CM | POA: Insufficient documentation

## 2022-05-25 DIAGNOSIS — R7309 Other abnormal glucose: Secondary | ICD-10-CM | POA: Diagnosis not present

## 2022-05-25 DIAGNOSIS — I10 Essential (primary) hypertension: Secondary | ICD-10-CM | POA: Diagnosis not present

## 2022-05-25 DIAGNOSIS — E7800 Pure hypercholesterolemia, unspecified: Secondary | ICD-10-CM

## 2022-05-25 DIAGNOSIS — Z23 Encounter for immunization: Secondary | ICD-10-CM | POA: Diagnosis not present

## 2022-05-25 DIAGNOSIS — E559 Vitamin D deficiency, unspecified: Secondary | ICD-10-CM | POA: Diagnosis not present

## 2022-05-25 MED ORDER — VITAMIN D (ERGOCALCIFEROL) 1.25 MG (50000 UNIT) PO CAPS: ORAL_CAPSULE | ORAL | 1 refills | Status: DC

## 2022-05-25 NOTE — Progress Notes (Signed)
[name] [name],acting as a Neurosurgeon for [name] Felts, FNP.,have documented all relevant documentation on the behalf of [name] Felts, FNP,as directed by  [name] Felts, FNP while in the presence of [name] Felts, FNP.    Subjective:     Patient ID: [name] [name] , male    DOB: 1970-07-20 , 52 y.o.   MRN: [id number]   Chief Complaint  Patient presents with   Hypertension   Diabetes    HPI  Patient presents today for bp, dm check patient reports she compliance with medications. Patient has no other concerns today. He is working from home 2.5 days a week and tries to get in some exercise.   Patient reports he is having a little elbow pain here recently.  BP Readings from Last 3 Encounters: 05/25/22 : 120/82 02/22/22 : 136/80 10/19/21 : (!) 130/90    Hypertension Pertinent negatives include no blurred vision or headaches.  Diabetes Pertinent negatives for hypoglycemia include no headaches. Pertinent negatives for diabetes include no blurred vision.     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HCC)    6mp since 2007   Hemorrhoids    Hypertension    Prehypertension 03/09/2013   Pulmonary embolism (HCC)    Rectal discomfort 05/10/2019     Family History  Problem Relation Age of Onset   Breast cancer Mother    Diabetes Father    COPD Father    Crohn's disease Other    Colon cancer Neg Hx    Rectal cancer Neg Hx    Stomach cancer Neg Hx      Current Outpatient Medications:    aspirin EC 81 MG tablet, Take 81 mg by mouth daily., Disp: , Rfl:    metoprolol succinate (TOPROL-XL) 25 MG 24 hr tablet, TAKE 1 TABLET BY MOUTH EVERY DAY, Disp: 90 tablet, Rfl: 1   Multiple Vitamin (MULTIVITAMIN WITH MINERALS) TABS tablet, Take 1 tablet by mouth once a week., Disp: , Rfl:    pantoprazole (PROTONIX) 40 MG tablet, TAKE 1 TABLET BY MOUTH EVERY DAY, Disp: 90 tablet, Rfl: 1   Vitamin D, Ergocalciferol, (DRISDOL)  1.25 MG (50000 UNIT) CAPS capsule, TAKE 1 CAPSULE BY MOUTH ONE TIME PER WEEK, Disp: 12 capsule, Rfl: 1   No Known Allergies   Review of Systems  Constitutional: Negative.   Eyes:  Negative for blurred vision.  Respiratory: Negative.    Cardiovascular: Negative.   Gastrointestinal: Negative.   Musculoskeletal:        Elbow pain  Neurological: Negative.  Negative for headaches.  Psychiatric/Behavioral: Negative.       Today's Vitals   05/25/22 0932  BP: 120/82  Pulse: 80  Temp: 98.8 F (37.1 C)  TempSrc: Oral  Weight: 250 lb 12.8 oz (113.8 kg)  Height: 5\' 9"  (1.753 m)  PainSc: 3   PainLoc: Elbow   Body mass index is 37.04 kg/m.  Wt Readings from Last 3 Encounters:  05/25/22 250 lb 12.8 oz (113.8 kg)  02/22/22 251 lb 6.4 oz (114 kg)  10/19/21 244 lb (110.7 kg)    The 10-year ASCVD risk score (Arnett DK, et al., 2019) is: 8%   Values used to calculate the score:     Age: 35 years     Sex: Male     Is Non-Hispanic African American: Yes     Diabetic: No  Tobacco smoker: No     Systolic Blood Pressure: 120 mmHg     Is BP treated: Yes     HDL Cholesterol: 42 mg/dL     Total Cholesterol: 187 mg/dL  Objective:  Physical Exam Vitals reviewed.  Constitutional:      General: He is not in acute distress.    Appearance: Normal appearance.  Cardiovascular:     Rate and Rhythm: Normal rate and regular rhythm.     Pulses: Normal pulses.     Heart sounds: Normal heart sounds. No murmur heard. Pulmonary:     Effort: Pulmonary effort is normal. No respiratory distress.     Breath sounds: Normal breath sounds. No wheezing.  Skin:    General: Skin is warm and dry.     Capillary Refill: Capillary refill takes less than 2 seconds.  Neurological:     General: No focal deficit present.     Mental Status: He is alert and oriented to person, place, and time.         Assessment And Plan:     1. Essential hypertension Comments: Blood pressure is well-controlled.   Continue current medications.  Encouraged to increase physical activity to strengthen heart. - Basic metabolic panel  2. Abnormal glucose Comments: Stable, continue focusing on diet low in sugar and starches. Increase physical activity to at least 150 minutes, start by walking - Hemoglobin A1c  3. Vitamin D deficiency Comments: Vitamin d level is controlled, will refill his vitamin D - Vitamin D, Ergocalciferol, (DRISDOL) 1.25 MG (50000 UNIT) CAPS capsule; TAKE 1 CAPSULE BY MOUTH ONE TIME PER WEEK  Dispense: 12 capsule; Refill: 1  4. Right elbow pain Comments: Advised to not rest elbow on desk or chair, if worsens call to office  5. Elevated LDL cholesterol level Comments: Discussed ASCVD risk score of 8.2%, will recheck labs and recalculate if over 7% will offer Calciium Risk score - Lipid panel  6. Need for COVID-19 vaccine Covid 19 vaccine given in office observed for 15 minutes without any adverse reaction - Pfizer Fall 2023 Covid-19 Vaccine 52yrs and older    Return for controlled DM check 4 months.  Patient was given opportunity to ask questions. Patient verbalized understanding of the plan and was able to repeat key elements of the plan. All questions were answered to their satisfaction.  [name] Felts, FNP   I, [name] Felts, FNP, have reviewed all documentation for this visit. The documentation on 05/25/22 for the exam, diagnosis, procedures, and orders are all accurate and complete.   IF YOU HAVE BEEN REFERRED TO A SPECIALIST, IT MAY TAKE 1-2 WEEKS TO SCHEDULE/PROCESS THE REFERRAL. IF YOU HAVE NOT HEARD FROM US/SPECIALIST IN TWO WEEKS, PLEASE GIVE [place] A CALL AT [phone].   THE PATIENT IS ENCOURAGED TO PRACTICE SOCIAL DISTANCING DUE TO THE COVID-19 PANDEMIC.

## 2022-05-26 LAB — HEMOGLOBIN A1C
Est. average glucose Bld gHb Est-mCnc: 120 mg/dL
Hgb A1c MFr Bld: 5.8 % — ABNORMAL HIGH (ref 4.8–5.6)

## 2022-05-26 LAB — BASIC METABOLIC PANEL
BUN/Creatinine Ratio: 9 (ref 9–20)
BUN: 12 mg/dL (ref 6–24)
CO2: 25 mmol/L (ref 20–29)
Calcium: 9.8 mg/dL (ref 8.7–10.2)
Chloride: 104 mmol/L (ref 96–106)
Creatinine, Ser: 1.31 mg/dL — ABNORMAL HIGH (ref 0.76–1.27)
Glucose: 93 mg/dL (ref 70–99)
Potassium: 4.9 mmol/L (ref 3.5–5.2)
Sodium: 142 mmol/L (ref 134–144)
eGFR: 66 mL/min/{1.73_m2} (ref >59)

## 2022-05-26 LAB — LIPID PANEL
Chol/HDL Ratio: 4.5 ratio (ref 0.0–5.0)
Cholesterol, Total: 187 mg/dL (ref 100–199)
HDL: 42 mg/dL (ref >39)
LDL Chol Calc (NIH): 126 mg/dL — ABNORMAL HIGH (ref 0–99)
Triglycerides: 105 mg/dL (ref 0–149)
VLDL Cholesterol Cal: 19 mg/dL (ref 5–40)

## 2022-08-24 ENCOUNTER — Encounter: Payer: BC Managed Care – PPO | Admitting: Nurse Practitioner

## 2022-08-25 ENCOUNTER — Encounter: Payer: Self-pay | Admitting: Nurse Practitioner

## 2022-08-25 ENCOUNTER — Telehealth (INDEPENDENT_AMBULATORY_CARE_PROVIDER_SITE_OTHER): Payer: BC Managed Care – PPO | Admitting: Nurse Practitioner

## 2022-08-25 DIAGNOSIS — U071 COVID-19: Secondary | ICD-10-CM | POA: Insufficient documentation

## 2022-08-25 MED ORDER — NIRMATRELVIR/RITONAVIR (PAXLOVID)TABLET
3.0000 | ORAL_TABLET | Freq: Two times a day (BID) | ORAL | 0 refills | Status: AC
Start: 2022-08-25 — End: 2022-08-30

## 2022-08-25 NOTE — Assessment & Plan Note (Signed)
Advised patient to take Vitamin C, D, Zinc.  Keep yourself hydrated with a lot of water and rest. Take Delsym for cough and Mucinex as need. Take Tylenol or pain reliever every 4-6 hours as needed for pain/fever/body ache. If you have elevated blood pressure, you can take OTC Corcidin. You can also take OTC oscillococcinum to help with your symptoms.  Educated patient if symptoms get worse or if she experiences any SOB, chest pain or pain in her legs to seek immediate emergency care. Continue to monitor your oxygen levels. Call us if you have any questions. Quarantine for 5 days then wear your mask for another 5 days after quarantine.

## 2022-08-25 NOTE — Patient Instructions (Addendum)
Encouraged to take Vitamin C, D, Zinc.  Keep yourself hydrated with a lot of water and rest. Take Delsym for cough and Mucinex as need. Take Tylenol or pain reliever every 4-6 hours as needed for pain/fever/body ache. If you have elevated blood pressure, you can take OTC Corcidin. You can also take OTC oscillococcinum to help with your symptoms.  Educated patient if symptoms get worse or if she experiences any SOB, chest pain or pain in her legs to seek immediate emergency care. Continue to monitor your oxygen levels. Call us if you have any questions. Quarantine for 5 days if tested positive and no symptoms or 10 days if tested positive and have symptoms.

## 2022-08-25 NOTE — Progress Notes (Signed)
Virtual Visit via MyChart   This visit type was conducted due to national recommendations for restrictions regarding the COVID-19 Pandemic (e.g. social distancing) in an effort to limit this patient's exposure and mitigate transmission in our community.  Due to his co-morbid illnesses, this patient is at least at moderate risk for complications without adequate follow up.  This format is felt to be most appropriate for this patient at this time.  All issues noted in this document were discussed and addressed.  A limited physical exam was performed with this format.    This visit type was conducted due to national recommendations for restrictions regarding the COVID-19 Pandemic (e.g. social distancing) in an effort to limit this patient's exposure and mitigate transmission in our community.  Patients identity confirmed using two different identifiers.  This format is felt to be most appropriate for this patient at this time.  All issues noted in this document were discussed and addressed.  No physical exam was performed (except for noted visual exam findings with Video Visits).    Date:  08/25/2022   ID:  [name] [name], DOB 1970/04/11, MRN [id number]  Patient Location:  Home - spoke to [employer]  Provider location:   Office    Chief Complaint:  positive home covid test  History of Present Illness:    [name] [name] is a 52 y.o. male who presents via video conferencing for a telehealth visit today.    The patient does have symptoms concerning for COVID-19 infection (fever, chills, cough, or new shortness of breath).   Patient presents today for testing positive for covid yesterday, patient reports yesterday his throat was scratchy and he he couldn't taste, patient also reports he has a runny nose. Now he has not taste to the tip of his tongue. He did go to his part time job yesterday, had his mask on. Patients wife also has covid.      Past Medical History:  Diagnosis Date    Anemia    iron def   Anxiety    B12 deficiency    Crohn's ileocolitis (HCC)    6mp since 2007   Hemorrhoids    Hypertension    Prehypertension 03/09/2013   Pulmonary embolism (HCC)    Rectal discomfort 05/10/2019   Past Surgical History:  Procedure Laterality Date   COLONOSCOPY     EXTERNAL FIXATION LEG Left 02/27/2013   Procedure: EXTERNAL FIXATION LEFT KNEE ;  Surgeon: Javier Docker, MD;  Location: [facility] OR;  Service: Orthopedics;  Laterality: Left;   EXTERNAL FIXATION LEG Left 03/01/2013   Procedure: REVISION OF EXTERNAL FIXATOR LEFT LEG;  Surgeon: Budd Palmer, MD;  Location: [facility] OR;  Service: Orthopedics;  Laterality: Left;   ileocecal resection  03/2005   ILEOSTOMY CLOSURE  08/2005   ileostomy/anastomotic resection for anastomotic leak  03/2005   KNEE ARTHROSCOPY Left 06/25/2013   Procedure: LYSIS OF ADHESION LEFT KNEE;  Surgeon: Budd Palmer, MD;  Location: [facility] OR;  Service: Orthopedics;  Laterality: Left;   LYSIS OF ADHESION Left 06/25/2013   WITH MANIPULATION     DR HANDY   ORIF TIBIA FRACTURE Left 03/08/2013   Procedure: LEFT OPEN REDUCTION INTERNAL FIXATION (ORIF) TIBIA FRACTURE;  Surgeon: Budd Palmer, MD;  Location: [facility] OR;  Service: Orthopedics;  Laterality: Left;     Current Meds  Medication Sig   aspirin EC 81 MG tablet Take 81 mg by mouth daily.   metoprolol succinate (TOPROL-XL) 25 MG 24  hr tablet TAKE 1 TABLET BY MOUTH EVERY DAY   Multiple Vitamin (MULTIVITAMIN WITH MINERALS) TABS tablet Take 1 tablet by mouth once a week.   nirmatrelvir/ritonavir (PAXLOVID) 20 x 150 MG & 10 x 100MG  TABS Take 3 tablets by mouth 2 (two) times daily for 5 days. Patient GFR is 66. Take nirmatrelvir (150 mg) two tablets twice daily for 5 days and ritonavir (100 mg) one tablet twice daily for 5 days.   pantoprazole (PROTONIX) 40 MG tablet TAKE 1 TABLET BY MOUTH EVERY DAY   Vitamin D, Ergocalciferol, (DRISDOL) 1.25 MG (50000 UNIT) CAPS capsule TAKE 1 CAPSULE BY MOUTH ONE TIME PER WEEK      Allergies:   Patient has no known allergies.   Social History   Tobacco Use   Smoking status: Never   Smokeless tobacco: Never  Vaping Use   Vaping status: Never Used  Substance Use Topics   Alcohol use: No   Drug use: No     Family Hx: The patient's family history includes Breast cancer in his mother; COPD in his father; Crohn's disease in an other family member; Diabetes in his father. There is no history of Colon cancer, Rectal cancer, or Stomach cancer.  ROS:   Please see the history of present illness.    Review of Systems  Constitutional: Negative.  Negative for malaise/fatigue.       Decreased taste  Respiratory: Negative.  Negative for cough, shortness of breath and wheezing.   Cardiovascular: Negative.  Negative for chest pain.  Musculoskeletal: Negative.   Neurological: Negative.  Negative for headaches.  Psychiatric/Behavioral: Negative.      All other systems reviewed and are negative.   Labs/Other Tests and Data Reviewed:    Recent Labs: 02/22/2022: Hemoglobin 14.2; Platelets 285; TSH 1.310 05/25/2022: BUN 12; Creatinine, Ser 1.31; Potassium 4.9; Sodium 142   Recent Lipid Panel Lab Results  Component Value Date/Time   CHOL 187 05/25/2022 11:01 AM   TRIG 105 05/25/2022 11:01 AM   HDL 42 05/25/2022 11:01 AM   CHOLHDL 4.5 05/25/2022 11:01 AM   LDLCALC 126 (H) 05/25/2022 11:01 AM    Wt Readings from Last 3 Encounters:  05/25/22 250 lb 12.8 oz (113.8 kg)  02/22/22 251 lb 6.4 oz (114 kg)  10/19/21 244 lb (110.7 kg)     Exam:    Vital Signs:  There were no vitals taken for this visit.    Physical Exam Vitals reviewed.  Constitutional:      General: He is not in acute distress.    Appearance: Normal appearance.  Pulmonary:     Effort: Pulmonary effort is normal. No respiratory distress.  Neurological:     General: No focal deficit present.     Mental Status: He is alert and oriented to person, place, and time. Mental status is at baseline.      Cranial Nerves: No cranial nerve deficit.  Psychiatric:        Mood and Affect: Mood and affect normal.        Behavior: Behavior normal.        Thought Content: Thought content normal.        Cognition and Memory: Memory normal.        Judgment: Judgment normal.     ASSESSMENT & PLAN:    Positive self-administered antigen test for COVID-19 Assessment & Plan: Advised patient to take Vitamin C, D, Zinc.  Keep yourself hydrated with a lot of water and rest. Take Delsym for cough  and Mucinex as need. Take Tylenol or pain reliever every 4-6 hours as needed for pain/fever/body ache. If you have elevated blood pressure, you can take OTC Corcidin. You can also take OTC oscillococcinum to help with your symptoms.  Educated patient if symptoms get worse or if she experiences any SOB, chest pain or pain in her legs to seek immediate emergency care. Continue to monitor your oxygen levels. Call us if you have any questions. Quarantine for 5 days then wear your mask for another 5 days after quarantine.    Orders: -     nirmatrelvir/ritonavir; Take 3 tablets by mouth 2 (two) times daily for 5 days. Patient GFR is 66. Take nirmatrelvir (150 mg) two tablets twice daily for 5 days and ritonavir (100 mg) one tablet twice daily for 5 days.  Dispense: 30 tablet; Refill: 0     COVID-19 Education: The signs and symptoms of COVID-19 were discussed with the patient and how to seek care for testing (follow up with PCP or arrange E-visit).  The importance of social distancing was discussed today.  Patient Risk:   After full review of this patients clinical status, I feel that they are at least moderate risk at this time.  Time:   Today, I have spent 6 minutes/ seconds with the patient with telehealth technology discussing above diagnoses.     Medication Adjustments/Labs and Tests Ordered: Current medicines are reviewed at length with the patient today.  Concerns regarding medicines are outlined above.    Tests Ordered: No orders of the defined types were placed in this encounter.   Medication Changes: Meds ordered this encounter  Medications   nirmatrelvir/ritonavir (PAXLOVID) 20 x 150 MG & 10 x 100MG  TABS    Sig: Take 3 tablets by mouth 2 (two) times daily for 5 days. Patient GFR is 66. Take nirmatrelvir (150 mg) two tablets twice daily for 5 days and ritonavir (100 mg) one tablet twice daily for 5 days.    Dispense:  30 tablet    Refill:  0    Disposition:  Follow up prn  Signed, Arnette Felts, FNP

## 2022-09-18 ENCOUNTER — Other Ambulatory Visit: Payer: Self-pay | Admitting: Nurse Practitioner

## 2022-09-18 DIAGNOSIS — K219 Gastro-esophageal reflux disease without esophagitis: Secondary | ICD-10-CM

## 2022-09-21 ENCOUNTER — Ambulatory Visit (INDEPENDENT_AMBULATORY_CARE_PROVIDER_SITE_OTHER): Payer: BC Managed Care – PPO | Admitting: Nurse Practitioner

## 2022-09-21 ENCOUNTER — Encounter: Payer: Self-pay | Admitting: Nurse Practitioner

## 2022-09-21 VITALS — BP 110/80 | HR 91 | Temp 98.0°F | Ht 69.0 in | Wt 255.0 lb

## 2022-09-21 DIAGNOSIS — E559 Vitamin D deficiency, unspecified: Secondary | ICD-10-CM

## 2022-09-21 DIAGNOSIS — Z23 Encounter for immunization: Secondary | ICD-10-CM | POA: Diagnosis not present

## 2022-09-21 DIAGNOSIS — I10 Essential (primary) hypertension: Secondary | ICD-10-CM

## 2022-09-21 DIAGNOSIS — R7309 Other abnormal glucose: Secondary | ICD-10-CM | POA: Diagnosis not present

## 2022-09-21 DIAGNOSIS — Z125 Encounter for screening for malignant neoplasm of prostate: Secondary | ICD-10-CM

## 2022-09-21 DIAGNOSIS — K5080 Crohn's disease of both small and large intestine without complications: Secondary | ICD-10-CM

## 2022-09-21 DIAGNOSIS — E6609 Other obesity due to excess calories: Secondary | ICD-10-CM

## 2022-09-21 DIAGNOSIS — E669 Obesity, unspecified: Secondary | ICD-10-CM

## 2022-09-21 DIAGNOSIS — Z0000 Encounter for general adult medical examination without abnormal findings: Secondary | ICD-10-CM

## 2022-09-21 DIAGNOSIS — Z79899 Other long term (current) drug therapy: Secondary | ICD-10-CM

## 2022-09-21 DIAGNOSIS — E7800 Pure hypercholesterolemia, unspecified: Secondary | ICD-10-CM

## 2022-09-21 LAB — POCT URINALYSIS DIP (CLINITEK)
Bilirubin, UA: NEGATIVE
Blood, UA: NEGATIVE
Glucose, UA: NEGATIVE mg/dL
Ketones, POC UA: NEGATIVE mg/dL
Leukocytes, UA: NEGATIVE
Nitrite, UA: NEGATIVE
POC PROTEIN,UA: NEGATIVE
Spec Grav, UA: >=1.03 — AB (ref 1.010–1.025)
Urobilinogen, UA: 0.2 U/dL — AB
pH, UA: 5 (ref 5.0–8.0)

## 2022-09-27 ENCOUNTER — Other Ambulatory Visit: Payer: Self-pay | Admitting: Nurse Practitioner

## 2022-09-27 ENCOUNTER — Ambulatory Visit: Payer: BC Managed Care – PPO | Admitting: Nurse Practitioner

## 2022-10-03 DIAGNOSIS — Z23 Encounter for immunization: Secondary | ICD-10-CM | POA: Insufficient documentation

## 2022-10-03 DIAGNOSIS — E7800 Pure hypercholesterolemia, unspecified: Secondary | ICD-10-CM | POA: Insufficient documentation

## 2022-10-03 DIAGNOSIS — Z125 Encounter for screening for malignant neoplasm of prostate: Secondary | ICD-10-CM | POA: Insufficient documentation

## 2022-10-03 DIAGNOSIS — Z0000 Encounter for general adult medical examination without abnormal findings: Secondary | ICD-10-CM | POA: Insufficient documentation

## 2022-10-03 NOTE — Assessment & Plan Note (Signed)
he is strongly encouraged to strive for BMI less than 30 to decrease cardiac risk. Advised to aim for at least 150 minutes of exercise per week.

## 2022-10-03 NOTE — Assessment & Plan Note (Signed)
No recent exacerbations.  

## 2022-10-03 NOTE — Assessment & Plan Note (Signed)
HgbA1c has been stable.

## 2022-10-03 NOTE — Assessment & Plan Note (Signed)
 Cholesterol levels are stable.  Continue focusing on low-fat diet.

## 2022-10-03 NOTE — Assessment & Plan Note (Signed)
Behavior modifications discussed and diet history reviewed.   Pt will continue to exercise regularly and modify diet with low GI, plant based foods and decrease intake of processed foods.  Recommend intake of daily multivitamin, Vitamin D, and calcium.  Recommend colonoscopy for preventive screenings, as well as recommend immunizations that include influenza, TDAP, and Shingles  

## 2022-10-03 NOTE — Assessment & Plan Note (Signed)
Will check vitamin D level and supplement as needed.    Also encouraged to spend 15 minutes in the sun daily.   

## 2022-10-03 NOTE — Assessment & Plan Note (Signed)
Influenza vaccine administered Encouraged to take Tylenol as needed for fever or muscle aches.

## 2022-10-03 NOTE — Assessment & Plan Note (Addendum)
Blood pressure was elevated with first check, repeat is improved. Encouraged to eat healthy diet low in salt. EKG done NSR HR 90

## 2023-03-06 ENCOUNTER — Other Ambulatory Visit: Payer: Self-pay | Admitting: Nurse Practitioner

## 2023-03-06 DIAGNOSIS — E559 Vitamin D deficiency, unspecified: Secondary | ICD-10-CM

## 2023-03-21 ENCOUNTER — Encounter: Payer: Self-pay | Admitting: Nurse Practitioner

## 2023-03-21 ENCOUNTER — Ambulatory Visit: Payer: BC Managed Care – PPO | Admitting: Nurse Practitioner

## 2023-03-21 VITALS — BP 120/70 | HR 88 | Temp 98.5°F | Ht 69.0 in | Wt 259.0 lb

## 2023-03-21 DIAGNOSIS — E66812 Obesity, class 2: Secondary | ICD-10-CM | POA: Insufficient documentation

## 2023-03-21 DIAGNOSIS — R7309 Other abnormal glucose: Secondary | ICD-10-CM | POA: Diagnosis not present

## 2023-03-21 DIAGNOSIS — Z6838 Body mass index (BMI) 38.0-38.9, adult: Secondary | ICD-10-CM | POA: Insufficient documentation

## 2023-03-21 DIAGNOSIS — I10 Essential (primary) hypertension: Secondary | ICD-10-CM | POA: Diagnosis not present

## 2023-03-21 DIAGNOSIS — E559 Vitamin D deficiency, unspecified: Secondary | ICD-10-CM | POA: Diagnosis not present

## 2023-03-21 DIAGNOSIS — Z23 Encounter for immunization: Secondary | ICD-10-CM | POA: Diagnosis not present

## 2023-03-21 HISTORY — DX: Body mass index (BMI) 38.0-38.9, adult: Z68.38

## 2023-03-21 LAB — BMP8+EGFR
BUN/Creatinine Ratio: 10 (ref 9–20)
BUN: 13 mg/dL (ref 6–24)
CO2: 22 mmol/L (ref 20–29)
Calcium: 9.8 mg/dL (ref 8.7–10.2)
Chloride: 103 mmol/L (ref 96–106)
Creatinine, Ser: 1.27 mg/dL (ref 0.76–1.27)
Glucose: 83 mg/dL (ref 70–99)
Potassium: 4.5 mmol/L (ref 3.5–5.2)
Sodium: 141 mmol/L (ref 134–144)
eGFR: 68 mL/min/{1.73_m2} (ref >59)

## 2023-03-21 LAB — HEMOGLOBIN A1C
Est. average glucose Bld gHb Est-mCnc: 120 mg/dL
Hgb A1c MFr Bld: 5.8 % — ABNORMAL HIGH (ref 4.8–5.6)

## 2023-03-21 NOTE — Assessment & Plan Note (Signed)
 HgbA1c has been stable. Will check HgbA1c levels

## 2023-03-21 NOTE — Progress Notes (Signed)
 Madelaine Bhat, CMA,acting as a Neurosurgeon for Arnette Felts, FNP.,have documented all relevant documentation on the behalf of Arnette Felts, FNP,as directed by  Arnette Felts, FNP while in the presence of Arnette Felts, FNP.  Subjective:  Patient ID: [name] [name] , male    DOB: January 11, 1970 , 53 y.o.   MRN: [id number]  Chief Complaint  Patient presents with   Hypertension    HPI  Patient presents today for a bp and pre dm follow up, Patient reports compliance with medication. Patient denies any chest pain, SOB, or headaches. Patient has no concerns today. Continues to work in [employer] at [employer] so he will be up moving around more.     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HCC)    6mp since 2007   Hemorrhoids    Hypertension    Prehypertension 03/09/2013   Pulmonary embolism (HCC)    Rectal discomfort 05/10/2019     Family History  Problem Relation Age of Onset   Breast cancer Mother    Diabetes Father    COPD Father    Crohn's disease Other    Colon cancer Neg Hx    Rectal cancer Neg Hx    Stomach cancer Neg Hx      Current Outpatient Medications:    aspirin EC 81 MG tablet, Take 81 mg by mouth daily., Disp: , Rfl:    metoprolol succinate (TOPROL-XL) 25 MG 24 hr tablet, TAKE 1 TABLET BY MOUTH EVERY DAY, Disp: 90 tablet, Rfl: 1   Multiple Vitamin (MULTIVITAMIN WITH MINERALS) TABS tablet, Take 1 tablet by mouth once a week., Disp: , Rfl:    pantoprazole (PROTONIX) 40 MG tablet, TAKE 1 TABLET BY MOUTH EVERY DAY, Disp: 90 tablet, Rfl: 1   Vitamin D, Ergocalciferol, (DRISDOL) 1.25 MG (50000 UNIT) CAPS capsule, TAKE 1 CAPSULE BY MOUTH ONE TIME PER WEEK, Disp: 12 capsule, Rfl: 1   No Known Allergies   Review of Systems  Constitutional: Negative.   Respiratory: Negative.    Cardiovascular: Negative.   Gastrointestinal: Negative.   Neurological: Negative.  Negative for headaches.  Psychiatric/Behavioral:  Negative.       Today's Vitals   03/21/23 0820  BP: 120/70  Pulse: 88  Temp: 98.5 F (36.9 C)  TempSrc: Oral  Weight: 259 lb (117.5 kg)  Height: 5\' 9"  (1.753 m)  PainSc: 0-No pain   Body mass index is 38.25 kg/m.  Wt Readings from Last 3 Encounters:  03/21/23 259 lb (117.5 kg)  09/21/22 255 lb (115.7 kg)  05/25/22 250 lb 12.8 oz (113.8 kg)    Objective:  Physical Exam Vitals reviewed.  Constitutional:      General: He is not in acute distress.    Appearance: Normal appearance. He is obese.  Cardiovascular:     Rate and Rhythm: Normal rate and regular rhythm.     Pulses: Normal pulses.     Heart sounds: Normal heart sounds. No murmur heard. Pulmonary:     Effort: Pulmonary effort is normal. No respiratory distress.     Breath sounds: Normal breath sounds. No wheezing.  Skin:    General: Skin is warm and dry.     Capillary Refill: Capillary refill takes less than 2 seconds.  Neurological:     General: No focal deficit present.     Mental Status: He is alert and oriented to person,  place, and time.     Cranial Nerves: No cranial nerve deficit.     Motor: No weakness.         Assessment And Plan:  Essential hypertension Assessment & Plan: Blood pressure is well controlled, continue current medications  Orders: -     BMP8+eGFR  Abnormal glucose Assessment & Plan: HgbA1c has been stable. Will check HgbA1c levels  Orders: -     Hemoglobin A1c  Vitamin D deficiency Assessment & Plan: Continue vitamin d supplementation, levels are normal    Also encouraged to spend 15 minutes in the sun daily.     COVID-19 vaccine administered Assessment & Plan: Covid 19 vaccine given in office observed for 15 minutes without any adverse reaction   Orders: -     Pfizer Comirnaty Covid-19 Vaccine 7yrs & older  Class 2 severe obesity due to excess calories with serious comorbidity and body mass index (BMI) of 38.0 to 38.9 in adult [facility]) Assessment & Plan: He is  encouraged to strive for BMI less than 30 to decrease cardiac risk. Advised to aim for at least 150 minutes of exercise per week. I have encouraged him to focus on small goals.      Return for keep same next.  Patient was given opportunity to ask questions. Patient verbalized understanding of the plan and was able to repeat key elements of the plan. All questions were answered to their satisfaction.    Jeanell Sparrow, FNP, have reviewed all documentation for this visit. The documentation on 03/21/23 for the exam, diagnosis, procedures, and orders are all accurate and complete.   IF YOU HAVE BEEN REFERRED TO A SPECIALIST, IT MAY TAKE 1-2 WEEKS TO SCHEDULE/PROCESS THE REFERRAL. IF YOU HAVE NOT HEARD FROM US/SPECIALIST IN TWO WEEKS, PLEASE GIVE [place] A CALL AT [phone].

## 2023-03-21 NOTE — Assessment & Plan Note (Signed)
 Blood pressure is well controlled, continue current medications.

## 2023-03-21 NOTE — Assessment & Plan Note (Signed)
 Continue vitamin d supplementation, levels are normal    Also encouraged to spend 15 minutes in the sun daily.

## 2023-03-21 NOTE — Assessment & Plan Note (Signed)
 He is encouraged to strive for BMI less than 30 to decrease cardiac risk. Advised to aim for at least 150 minutes of exercise per week. I have encouraged him to focus on small goals.

## 2023-03-21 NOTE — Assessment & Plan Note (Signed)
 Covid 19 vaccine given in office observed for 15 minutes without any adverse reaction

## 2023-04-04 ENCOUNTER — Other Ambulatory Visit: Payer: Self-pay | Admitting: Nurse Practitioner

## 2023-04-04 DIAGNOSIS — K219 Gastro-esophageal reflux disease without esophagitis: Secondary | ICD-10-CM

## 2023-04-14 ENCOUNTER — Encounter: Payer: Self-pay | Admitting: Nurse Practitioner

## 2023-08-22 ENCOUNTER — Other Ambulatory Visit: Payer: Self-pay | Admitting: Nurse Practitioner

## 2023-08-22 DIAGNOSIS — E559 Vitamin D deficiency, unspecified: Secondary | ICD-10-CM

## 2023-09-22 ENCOUNTER — Encounter: Payer: Self-pay | Admitting: Nurse Practitioner

## 2023-09-22 ENCOUNTER — Ambulatory Visit (INDEPENDENT_AMBULATORY_CARE_PROVIDER_SITE_OTHER): Payer: BC Managed Care – PPO | Admitting: Nurse Practitioner

## 2023-09-22 VITALS — BP 130/80 | HR 81 | Temp 98.6°F | Ht 69.0 in | Wt 256.8 lb

## 2023-09-22 DIAGNOSIS — Z23 Encounter for immunization: Secondary | ICD-10-CM | POA: Diagnosis not present

## 2023-09-22 DIAGNOSIS — E7800 Pure hypercholesterolemia, unspecified: Secondary | ICD-10-CM | POA: Diagnosis not present

## 2023-09-22 DIAGNOSIS — K219 Gastro-esophageal reflux disease without esophagitis: Secondary | ICD-10-CM

## 2023-09-22 DIAGNOSIS — Z2821 Immunization not carried out because of patient refusal: Secondary | ICD-10-CM

## 2023-09-22 DIAGNOSIS — E66812 Obesity, class 2: Secondary | ICD-10-CM | POA: Insufficient documentation

## 2023-09-22 DIAGNOSIS — K5080 Crohn's disease of both small and large intestine without complications: Secondary | ICD-10-CM | POA: Diagnosis not present

## 2023-09-22 DIAGNOSIS — K029 Dental caries, unspecified: Secondary | ICD-10-CM | POA: Insufficient documentation

## 2023-09-22 DIAGNOSIS — E6609 Other obesity due to excess calories: Secondary | ICD-10-CM | POA: Insufficient documentation

## 2023-09-22 DIAGNOSIS — Z6837 Body mass index (BMI) 37.0-37.9, adult: Secondary | ICD-10-CM

## 2023-09-22 DIAGNOSIS — R7309 Other abnormal glucose: Secondary | ICD-10-CM

## 2023-09-22 DIAGNOSIS — Z0000 Encounter for general adult medical examination without abnormal findings: Secondary | ICD-10-CM

## 2023-09-22 DIAGNOSIS — Z139 Encounter for screening, unspecified: Secondary | ICD-10-CM | POA: Insufficient documentation

## 2023-09-22 DIAGNOSIS — Z125 Encounter for screening for malignant neoplasm of prostate: Secondary | ICD-10-CM

## 2023-09-22 DIAGNOSIS — I10 Essential (primary) hypertension: Secondary | ICD-10-CM | POA: Diagnosis not present

## 2023-09-22 DIAGNOSIS — Z79899 Other long term (current) drug therapy: Secondary | ICD-10-CM

## 2023-09-22 DIAGNOSIS — E559 Vitamin D deficiency, unspecified: Secondary | ICD-10-CM

## 2023-09-22 LAB — POCT URINALYSIS DIP (CLINITEK)
Bilirubin, UA: NEGATIVE
Blood, UA: NEGATIVE
Glucose, UA: NEGATIVE mg/dL
Ketones, POC UA: NEGATIVE mg/dL
Leukocytes, UA: NEGATIVE
Nitrite, UA: NEGATIVE
Spec Grav, UA: 1.02 (ref 1.010–1.025)
Urobilinogen, UA: 1 U/dL
pH, UA: 7 (ref 5.0–8.0)

## 2023-09-22 MED ORDER — PANTOPRAZOLE SODIUM 40 MG PO TBEC: 40.0000 mg | DELAYED_RELEASE_TABLET | Freq: Every day | ORAL | 1 refills | Status: AC

## 2023-09-22 MED ORDER — METOPROLOL SUCCINATE ER 25 MG PO TB24: 25.0000 mg | ORAL_TABLET | Freq: Every day | ORAL | 1 refills | Status: AC

## 2023-09-22 MED ORDER — VITAMIN D (ERGOCALCIFEROL) 1.25 MG (50000 UNIT) PO CAPS: ORAL_CAPSULE | ORAL | 1 refills | Status: AC

## 2023-09-22 NOTE — Assessment & Plan Note (Signed)
 Behavior modifications discussed and diet history reviewed.   Pt will continue to exercise regularly and modify diet with low GI, plant based foods and decrease intake of processed foods.  Recommend intake of daily multivitamin, Vitamin D, and calcium.  Recommend colonoscopy for preventive screenings, as well as recommend immunizations that include influenza, TDAP, and Shingles

## 2023-09-22 NOTE — Progress Notes (Signed)
 LILLETTE Kristeen JINNY Gladis, CMA,acting as a Neurosurgeon for Gaines Ada, FNP.,have documented all relevant documentation on the behalf of Gaines Ada, FNP,as directed by  Gaines Ada, FNP while in the presence of Gaines Ada, FNP.  Subjective:   Patient ID: [name] [name] , male    DOB: 1970/12/27 , 53 y.o.   MRN: [id number]  Chief Complaint  Patient presents with   Annual Exam    Patient presents today for HM, Patient reports compliance with medication. Patient denies any chest pain, SOB, or headaches. Patient has no concerns today.     HPI Discussed the use of AI scribe software for clinical note transcription with the patient, who gave verbal consent to proceed.  History of Present Illness [name] [name] is a 53 year old male who presents for an annual physical exam.  He had a severe cold at the beginning of the month but is now symptom-free. He initially postponed his flu shot due to illness but plans to receive it now.  His physical activity has slightly increased as he walks more at work, needing to traverse the campus and street. However, he acknowledges the need for further increase in physical activity due to prolonged sitting from his full-time and part-time jobs.  He finds it challenging to cook during the week because of his work schedule, so he prepares meals on weekends to last until mid-week. His wife usually cooks, but her recent illness has affected his meal preparation.  He has not visited the gastroenterologist recently but reports stable gastrointestinal health. His last visit was approximately three years ago, coinciding with his last colonoscopy in 2021.  He experiences dental sensitivity to cold foods and drinks and has not been visiting the dentist regularly.  No swelling in his feet or ankles. He experiences nocturia, waking once per night to urinate, which he attributes to disrupted sleep patterns.  He continues to take metoprolol , pantoprazole  daily, and vitamin D  once a  week.    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HCC)    6mp since 2007   Hemorrhoids    Hypertension    Prehypertension 03/09/2013   Pulmonary embolism (HCC)    Rectal discomfort 05/10/2019     Family History  Problem Relation Age of Onset   Breast cancer Mother    Diabetes Father    COPD Father    Crohn's disease Other    Colon cancer Neg Hx    Rectal cancer Neg Hx    Stomach cancer Neg Hx      Current Outpatient Medications:    aspirin EC 81 MG tablet, Take 81 mg by mouth daily., Disp: , Rfl:    Multiple Vitamin (MULTIVITAMIN WITH MINERALS) TABS tablet, Take 1 tablet by mouth once a week., Disp: , Rfl:    metoprolol  succinate (TOPROL -XL) 25 MG 24 hr tablet, Take 1 tablet (25 mg total) by mouth daily., Disp: 90 tablet, Rfl: 1   pantoprazole  (PROTONIX ) 40 MG tablet, Take 1 tablet (40 mg total) by mouth daily., Disp: 90 tablet, Rfl: 1   Vitamin D , Ergocalciferol , (DRISDOL ) 1.25 MG (50000 UNIT) CAPS capsule, TAKE 1 CAPSULE BY MOUTH ONE TIME PER WEEK, Disp: 12 capsule, Rfl: 1   No Known Allergies   Men's preventive visit. Patient Health Questionnaire (PHQ-2) is  Flowsheet Row Office Visit from 09/22/2023 in [facility]  PHQ-2  Total Score 0  Patient is on a Regular diet; admits it needs to be better. Exercising is minimal but will walk at work to go and get lunch. Marital status: Married. Relevant history for alcohol use is:  Social History   Substance and Sexual Activity  Alcohol Use No  Relevant history for tobacco use is:  Social History   Tobacco Use  Smoking Status Never  Smokeless Tobacco Never  .   Review of Systems  Constitutional: Negative.   HENT: Negative.    Eyes: Negative.   Respiratory: Negative.    Cardiovascular: Negative.   Gastrointestinal: Negative.   Endocrine: Negative.   Genitourinary: Negative.   Musculoskeletal: Negative.   Skin:  Negative.   Allergic/Immunologic: Negative.   Neurological: Negative.   Hematological: Negative.   Psychiatric/Behavioral: Negative.       Today's Vitals   09/22/23 0833  BP: 130/80  Pulse: 81  Temp: 98.6 F (37 C)  TempSrc: Oral  Weight: 256 lb 12.8 oz (116.5 kg)  Height: 5' 9 (1.753 m)  PainSc: 0-No pain   Body mass index is 37.92 kg/m.  Wt Readings from Last 3 Encounters:  09/22/23 256 lb 12.8 oz (116.5 kg)  03/21/23 259 lb (117.5 kg)  09/21/22 255 lb (115.7 kg)    Objective:  Physical Exam Vitals and nursing note reviewed.  Constitutional:      General: He is not in acute distress.    Appearance: Normal appearance. He is obese.  HENT:     Head: Normocephalic and atraumatic.     Right Ear: Tympanic membrane, ear canal and external ear normal. There is no impacted cerumen.     Left Ear: Tympanic membrane, ear canal and external ear normal. There is no impacted cerumen.     Nose: Nose normal.     Mouth/Throat:     Mouth: Mucous membranes are moist.  Eyes:     Extraocular Movements: Extraocular movements intact.     Conjunctiva/sclera: Conjunctivae normal.     Pupils: Pupils are equal, round, and reactive to light.  Cardiovascular:     Rate and Rhythm: Normal rate and regular rhythm.     Pulses: Normal pulses.     Heart sounds: Normal heart sounds. No murmur heard. Pulmonary:     Effort: Pulmonary effort is normal. No respiratory distress.     Breath sounds: Normal breath sounds. No wheezing.  Abdominal:     General: Abdomen is flat. Bowel sounds are normal. There is no distension.     Palpations: Abdomen is soft. There is no mass.     Tenderness: There is no abdominal tenderness.  Genitourinary:    Prostate: Normal.     Rectum: Guaiac result negative.  Musculoskeletal:        General: No swelling or tenderness. Normal range of motion.     Cervical back: Normal range of motion and neck supple. No tenderness.  Skin:    General: Skin is warm and dry.      Capillary Refill: Capillary refill takes less than 2 seconds.     Comments: Scarring and keloids noted to abdomen from previous surgery  Neurological:     General: No focal deficit present.     Mental Status: He is alert and oriented to person, place, and time.     Cranial Nerves: No cranial nerve deficit.     Motor: No weakness.  Psychiatric:        Mood and Affect: Mood normal.  Behavior: Behavior normal.        Thought Content: Thought content normal.        Judgment: Judgment normal.      Assessment And Plan:    Encounter for annual health examination Assessment & Plan: Behavior modifications discussed and diet history reviewed.   Pt will continue to exercise regularly and modify diet with low GI, plant based foods and decrease intake of processed foods.  Recommend intake of daily multivitamin, Vitamin D , and calcium.  Recommend colonoscopy for preventive screenings, as well as recommend immunizations that include influenza, TDAP, and Shingles    Need for influenza vaccination Assessment & Plan: Influenza vaccine administered Encouraged to take Tylenol  as needed for fever or muscle aches.   Orders: -     Flu vaccine trivalent PF, 6mos and older(Flulaval,Afluria,Fluarix,Fluzone)  Crohn's disease of both small and large intestine without complication (HCC) Assessment & Plan: No recent exacerbations, I have advised him to call his GI to ensure he does not need to be seen on a yearly basis. His next colonoscopy is due in 2026   Elevated cholesterol Assessment & Plan: Cholesterol levels were elevated at last visit, will check lipid panel  Orders: -     Lipid panel  Essential hypertension Assessment & Plan: Blood pressure is well controlled, continue current medications. EKG done no change from previous EKG  Orders: -     EKG 12-Lead -     POCT URINALYSIS DIP (CLINITEK) -     CMP14+EGFR -     Microalbumin / creatinine urine ratio  Abnormal  glucose Assessment & Plan: HgbA1c has been stable. Will check HgbA1c levels  Orders: -     Hemoglobin A1c  Vitamin D  deficiency Assessment & Plan: Continue vitamin d  supplementation, levels are normal    Also encouraged to spend 15 minutes in the sun daily.    Orders: -     VITAMIN D  25 Hydroxy (Vit-D Deficiency, Fractures) -     Vitamin D  (Ergocalciferol ); TAKE 1 CAPSULE BY MOUTH ONE TIME PER WEEK  Dispense: 12 capsule; Refill: 1  COVID-19 vaccination declined  Class 2 obesity due to excess calories with body mass index (BMI) of 37.0 to 37.9 in adult, unspecified whether serious comorbidity present Assessment & Plan: He is encouraged to strive for BMI less than 30 to decrease cardiac risk. Advised to aim for at least 150 minutes of exercise per week.    Other long term (current) drug therapy -     CBC with Differential/Platelet  Encounter for screening -     Hepatitis B surface antibody,qualitative  Encounter for prostate cancer screening -     PSA  Vitamin D  deficiency Assessment & Plan: Continue vitamin d  supplementation, levels are normal    Also encouraged to spend 15 minutes in the sun daily.    Orders: -     VITAMIN D  25 Hydroxy (Vit-D Deficiency, Fractures) -     Vitamin D  (Ergocalciferol ); TAKE 1 CAPSULE BY MOUTH ONE TIME PER WEEK  Dispense: 12 capsule; Refill: 1  Gastroesophageal reflux disease without esophagitis -     Pantoprazole  Sodium; Take 1 tablet (40 mg total) by mouth daily.  Dispense: 90 tablet; Refill: 1  Dental caries Assessment & Plan: Suspected dental caries with cold sensitivity and has noticeable dental caries to his upper wisdom molars - He can pick up the dentist list at the front desck for evaluation and management.   Other orders -     Metoprolol  Succinate  ER; Take 1 tablet (25 mg total) by mouth daily.  Dispense: 90 tablet; Refill: 1     Return for 1 year physical, 6 month bp check. Patient was given opportunity to ask  questions. Patient verbalized understanding of the plan and was able to repeat key elements of the plan. All questions were answered to their satisfaction.   Gaines Ada, FNP  I, Gaines Ada, FNP, have reviewed all documentation for this visit. The documentation on 09/22/23 for the exam, diagnosis, procedures, and orders are all accurate and complete.

## 2023-09-22 NOTE — Assessment & Plan Note (Signed)
 Suspected dental caries with cold sensitivity and has noticeable dental caries to his upper wisdom molars - He can pick up the dentist list at the front desck for evaluation and management.

## 2023-09-22 NOTE — Patient Instructions (Addendum)
 Health Maintenance  Topic Date Due   COVID-19 Vaccine (6 - Moderna risk 2024-25 season) 10/08/2023*   Pneumococcal Vaccine for age over 57 (1 of 1 - PCV) 09/21/2024*   Hepatitis B Vaccine (1 of 3 - 19+ 3-dose series) 09/21/2024*   Colon Cancer Screening  05/28/2024   DTaP/Tdap/Td vaccine (2 - Td or Tdap) 07/10/2025   Flu Shot  Completed   Hepatitis C Screening  Completed   HIV Screening  Completed   Zoster (Shingles) Vaccine  Completed   HPV Vaccine  Aged Out   Meningitis B Vaccine  Aged Out  *Topic was postponed. The date shown is not the original due date.

## 2023-09-22 NOTE — Assessment & Plan Note (Signed)
 Influenza vaccine administered Encouraged to take Tylenol as needed for fever or muscle aches.

## 2023-09-22 NOTE — Assessment & Plan Note (Signed)
 Cholesterol levels were elevated at last visit, will check lipid panel

## 2023-09-22 NOTE — Assessment & Plan Note (Signed)
 He is encouraged to strive for BMI less than 30 to decrease cardiac risk. Advised to aim for at least 150 minutes of exercise per week.

## 2023-09-22 NOTE — Assessment & Plan Note (Signed)
 Continue vitamin d supplementation, levels are normal    Also encouraged to spend 15 minutes in the sun daily.

## 2023-09-22 NOTE — Assessment & Plan Note (Signed)
 HgbA1c has been stable. Will check HgbA1c levels

## 2023-09-22 NOTE — Assessment & Plan Note (Addendum)
 Blood pressure is well controlled, continue current medications. EKG done no change from previous EKG

## 2023-09-22 NOTE — Assessment & Plan Note (Signed)
 No recent exacerbations, I have advised him to call his GI to ensure he does not need to be seen on a yearly basis. His next colonoscopy is due in 2026

## 2023-09-23 LAB — LIPID PANEL
Chol/HDL Ratio: 5.1 ratio — ABNORMAL HIGH (ref 0.0–5.0)
Cholesterol, Total: 194 mg/dL (ref 100–199)
HDL: 38 mg/dL — ABNORMAL LOW (ref >39)
LDL Chol Calc (NIH): 137 mg/dL — ABNORMAL HIGH (ref 0–99)
Triglycerides: 105 mg/dL (ref 0–149)
VLDL Cholesterol Cal: 19 mg/dL (ref 5–40)

## 2023-09-23 LAB — CBC WITH DIFFERENTIAL/PLATELET
Basophils Absolute: 0 x10E3/uL (ref 0.0–0.2)
Basos: 1 %
EOS (ABSOLUTE): 0.1 x10E3/uL (ref 0.0–0.4)
Eos: 2 %
Hematocrit: 44.7 % (ref 37.5–51.0)
Hemoglobin: 13.9 g/dL (ref 13.0–17.7)
Immature Grans (Abs): 0 x10E3/uL (ref 0.0–0.1)
Immature Granulocytes: 0 %
Lymphocytes Absolute: 1.2 x10E3/uL (ref 0.7–3.1)
Lymphs: 25 %
MCH: 26.7 pg (ref 26.6–33.0)
MCHC: 31.1 g/dL — ABNORMAL LOW (ref 31.5–35.7)
MCV: 86 fL (ref 79–97)
Monocytes Absolute: 0.5 x10E3/uL (ref 0.1–0.9)
Monocytes: 10 %
Neutrophils Absolute: 2.8 x10E3/uL (ref 1.4–7.0)
Neutrophils: 62 %
Platelets: 327 x10E3/uL (ref 150–450)
RBC: 5.21 x10E6/uL (ref 4.14–5.80)
RDW: 13.2 % (ref 11.6–15.4)
WBC: 4.6 x10E3/uL (ref 3.4–10.8)

## 2023-09-23 LAB — CMP14+EGFR
ALT: 26 IU/L (ref 0–44)
AST: 21 IU/L (ref 0–40)
Albumin: 4.5 g/dL (ref 3.8–4.9)
Alkaline Phosphatase: 83 IU/L (ref 47–123)
BUN/Creatinine Ratio: 9 (ref 9–20)
BUN: 13 mg/dL (ref 6–24)
Bilirubin Total: 0.8 mg/dL (ref 0.0–1.2)
CO2: 23 mmol/L (ref 20–29)
Calcium: 9.7 mg/dL (ref 8.7–10.2)
Chloride: 103 mmol/L (ref 96–106)
Creatinine, Ser: 1.39 mg/dL — ABNORMAL HIGH (ref 0.76–1.27)
Globulin, Total: 2.6 g/dL (ref 1.5–4.5)
Glucose: 90 mg/dL (ref 70–99)
Potassium: 4.8 mmol/L (ref 3.5–5.2)
Sodium: 142 mmol/L (ref 134–144)
Total Protein: 7.1 g/dL (ref 6.0–8.5)
eGFR: 61 mL/min/1.73 (ref >59)

## 2023-09-23 LAB — HEPATITIS B SURFACE ANTIBODY,QUALITATIVE: Hep B Surface Ab, Qual: NONREACTIVE

## 2023-09-23 LAB — MICROALBUMIN / CREATININE URINE RATIO
Creatinine, Urine: 258.1 mg/dL
Microalb/Creat Ratio: 3 mg/g{creat} (ref 0–29)
Microalbumin, Urine: 6.7 ug/mL

## 2023-09-23 LAB — HEMOGLOBIN A1C
Est. average glucose Bld gHb Est-mCnc: 126 mg/dL
Hgb A1c MFr Bld: 6 % — ABNORMAL HIGH (ref 4.8–5.6)

## 2023-09-23 LAB — VITAMIN D 25 HYDROXY (VIT D DEFICIENCY, FRACTURES): Vit D, 25-Hydroxy: 54.2 ng/mL (ref 30.0–100.0)

## 2023-09-23 LAB — PSA: Prostate Specific Ag, Serum: 1.1 ng/mL (ref 0.0–4.0)

## 2023-10-31 ENCOUNTER — Encounter: Payer: Self-pay | Admitting: Gastroenterology

## 2023-10-31 ENCOUNTER — Ambulatory Visit: Admitting: Internal Medicine

## 2023-10-31 ENCOUNTER — Encounter: Payer: Self-pay | Admitting: Internal Medicine

## 2023-10-31 VITALS — BP 110/80 | HR 78 | Temp 97.9°F | Ht 69.0 in | Wt 250.4 lb

## 2023-10-31 DIAGNOSIS — Z6836 Body mass index (BMI) 36.0-36.9, adult: Secondary | ICD-10-CM | POA: Diagnosis not present

## 2023-10-31 DIAGNOSIS — R197 Diarrhea, unspecified: Secondary | ICD-10-CM

## 2023-10-31 DIAGNOSIS — E66812 Obesity, class 2: Secondary | ICD-10-CM

## 2023-10-31 DIAGNOSIS — K5080 Crohn's disease of both small and large intestine without complications: Secondary | ICD-10-CM | POA: Diagnosis not present

## 2023-10-31 LAB — POC HEMOCCULT BLD/STL (OFFICE/1-CARD/DIAGNOSTIC): Fecal Occult Blood, POC: NEGATIVE

## 2023-10-31 NOTE — Patient Instructions (Signed)
 Diarrhea, Adult Diarrhea is when you pass loose and sometimes watery poop (stool) often. Diarrhea can make you feel weak and cause you to lose water in your body (get dehydrated). Losing water in your body can cause you to: Feel tired and thirsty. Have a dry mouth. Go pee (urinate) less often. Diarrhea often lasts 2-3 days. It can last longer if it is a sign of something more serious. Be sure to treat your diarrhea as told by your doctor. Follow these instructions at home: Eating and drinking     Follow these instructions as told by your doctor: Take an ORS (oral rehydration solution). This is a drink that helps you replace fluids and minerals your body lost. It is sold at pharmacies and stores. Drink enough fluid to keep your pee (urine) pale yellow. Drink fluids such as: Water. You can also get fluids by sucking on ice chips. Diluted fruit juice. Low-calorie sports drinks. Milk. Avoid drinking fluids that have a lot of sugar or caffeine in them. These include soda, energy drinks, and regular sports drinks. Avoid alcohol. Eat bland, easy-to-digest foods in small amounts as you are able. These foods include: Bananas. Applesauce. Rice. Low-fat (lean) meats. Toast. Crackers. Avoid spicy or fatty foods.  Medicines Take over-the-counter and prescription medicines only as told by your doctor. If you were prescribed antibiotics, take them as told by your doctor. Do not stop taking them even if you start to feel better. General instructions  Wash your hands often using soap and water for 20 seconds. If soap and water are not available, use hand sanitizer. Others in your home should wash their hands as well. Wash your hands: After using the toilet or changing a diaper. Before preparing, cooking, or serving food. While caring for a sick person. While visiting someone in a hospital. Rest at home while you get better. Take a warm bath to help with any burning or pain from having  diarrhea. Watch your condition for any changes. Contact a doctor if: You have a fever. Your diarrhea gets worse. You have new symptoms. You vomit every time you eat or drink. You feel light-headed, dizzy, or you have a headache. You have muscle cramps. You have signs of losing too much water in your body, such as: Dark pee, very little pee, or no pee. Cracked lips. Dry mouth. Sunken eyes. Sleepiness. Weakness. You have bloody or black poop or poop that looks like tar. You have very bad pain, cramping, or bloating in your belly (abdomen). Your skin feels cold and clammy. You feel confused. Get help right away if: You have chest pain. Your heart is beating very quickly. You have trouble breathing or you are breathing very quickly. You feel very weak or you faint. These symptoms may be an emergency. Get help right away. Call 911. Do not wait to see if the symptoms will go away. Do not drive yourself to the hospital. This information is not intended to replace advice given to you by your health care provider. Make sure you discuss any questions you have with your health care provider. Document Revised: 06/09/2021 Document Reviewed: 06/09/2021 Elsevier Patient Education  2024 [employer].

## 2023-10-31 NOTE — Progress Notes (Signed)
 I,Victoria T Emmitt, CMA,acting as a neurosurgeon for [name] LOISE Slocumb, MD.,have documented all relevant documentation on the behalf of [name] LOISE Slocumb, MD,as directed by  [name] LOISE Slocumb, MD while in the presence of [name] LOISE Slocumb, MD.  Subjective:  Patient ID: [name] [name] , male    DOB: [birth year] , 53 y.o.   MRN: [id number]  Chief Complaint  Patient presents with   Diarrhea    Patient presents today for diarrhea. He reports this issue started Thursday. He reports taking pepto bismol on Friday & then Imodium on Saturday, Sunday & this morning. He admits having another diarrhea episode on this morning.  Denies fever/ chills.  Denies noticing blood in stool.    HPI Discussed the use of AI scribe software for clinical note transcription with the patient, who gave verbal consent to proceed.  History of Present Illness [name] [name] is a 53 year old male with Crohn's disease who presents with diarrhea.  He has been experiencing diarrhea since Thursday, initially with a frequency of four to five times in the morning, increasing to five to seven times throughout the day. He was unable to work on Thursday due to the symptoms. By Friday, the frequency decreased to about three times, and on Saturday, he had one regular bowel movement after taking Imodium D. He took Imodium again on Sunday, resulting in two regular bowel movements. However, on Monday morning, the diarrhea returned with two back-to-back episodes. The stool is described as dark brown. No fever, chills, or blood in the stool.  He has a history of Crohn's disease. During flares, he has experienced fever, abdominal pain, anemia, and sometimes diarrhea, particularly during a severe episode in childhood that required surgery and a temporary colostomy bag. He has not had a flare in a while and last saw his GI doctor in 2022. During past flares, he has not typically experienced diarrhea, except during a severe episode in childhood that required  surgery and a temporary colostomy bag.  He works part-time at the [employer], primarily at the [employer], and has been involved in a project at [employer]. He mentions eating at [facility] on Wednesday, prior to the onset of symptoms, and has been there frequently without previous issues. He has not consumed much water recently and has been drinking juice and soda instead.  His current medications include Protonix  (pantoprazole ) for reflux. He also has a history of high blood pressure and prediabetes, with a recent blood test in September showing dehydration and slightly decreased kidney function.    Past Medical History:  Diagnosis Date   Anemia    iron def   Anxiety    B12 deficiency    BMI 38.0-38.9,adult 03/21/2023   Crohn's ileocolitis (HCC)    6mp since 2007   Hemorrhoids    Hypertension    Prehypertension 03/09/2013   Pulmonary embolism (HCC)    Rectal discomfort 05/10/2019     Family History  Problem Relation Age of Onset   Breast cancer Mother    Diabetes Father    COPD Father    Crohn's disease Other    Colon cancer Neg Hx    Rectal cancer Neg Hx    Stomach cancer Neg Hx      Current Outpatient Medications:    aspirin EC 81 MG tablet, Take 81 mg by mouth daily., Disp: , Rfl:    metoprolol  succinate (TOPROL -XL) 25 MG 24 hr tablet, Take 1 tablet (25 mg total) by mouth daily.,  Disp: 90 tablet, Rfl: 1   Multiple Vitamin (MULTIVITAMIN WITH MINERALS) TABS tablet, Take 1 tablet by mouth once a week., Disp: , Rfl:    pantoprazole  (PROTONIX ) 40 MG tablet, Take 1 tablet (40 mg total) by mouth daily., Disp: 90 tablet, Rfl: 1   Vitamin D , Ergocalciferol , (DRISDOL ) 1.25 MG (50000 UNIT) CAPS capsule, TAKE 1 CAPSULE BY MOUTH ONE TIME PER WEEK, Disp: 12 capsule, Rfl: 1   No Known Allergies   Review of Systems  Constitutional: Negative.   HENT: Negative.    Respiratory: Negative.    Cardiovascular: Negative.   Gastrointestinal:  Positive for diarrhea.  Endocrine:  Negative.   Skin: Negative.   Allergic/Immunologic: Negative.   Hematological: Negative.      Today's Vitals   10/31/23 1438  BP: 110/80  Pulse: 78  Temp: 97.9 F (36.6 C)  SpO2: 98%  Weight: 250 lb 6.4 oz (113.6 kg)  Height: 5' 9 (1.753 m)   Body mass index is 36.98 kg/m.  Wt Readings from Last 3 Encounters:  10/31/23 250 lb 6.4 oz (113.6 kg)  09/22/23 256 lb 12.8 oz (116.5 kg)  03/21/23 259 lb (117.5 kg)     Objective:  Physical Exam Vitals and nursing note reviewed. Exam conducted with a chaperone present.  Constitutional:      Appearance: Normal appearance. He is obese.  HENT:     Head: Normocephalic and atraumatic.  Eyes:     Extraocular Movements: Extraocular movements intact.  Cardiovascular:     Rate and Rhythm: Normal rate and regular rhythm.     Heart sounds: Normal heart sounds.  Pulmonary:     Effort: Pulmonary effort is normal.     Breath sounds: Normal breath sounds.  Abdominal:     General: Bowel sounds are normal.     Palpations: Abdomen is soft.  Genitourinary:    Rectum: Guaiac result negative.  Musculoskeletal:     Cervical back: Normal range of motion.  Skin:    General: Skin is warm.  Neurological:     General: No focal deficit present.     Mental Status: He is alert.  Psychiatric:        Mood and Affect: Mood normal.         Assessment And Plan:  Acute diarrhea Assessment & Plan: Acute diarrhea likely dietary-related, not typical of patient's usual Crohn's flare symptoms. - Perform rectal exam for blood in stool. - Order CBC and electrolytes. - Advise increased fluid intake, including water and Gatorade. - Recommend dietary modifications, including baked or mashed potatoes for potassium. - Contact GI specialist Dr. Gwendlyn Buddy if blood in stool or symptoms worsen. - Stool is hemoccult negative  Orders: -     CBC -     CMP14+EGFR -     POC Hemoccult Bld/Stl (1-Cd Office Dx)  Crohn's disease of both small and large  intestine without complication (HCC) Assessment & Plan: Chronic, most recent GI note reviewed.  - He agrees to GI eval if sx persist/worsen or if blood in stool   Class 2 severe obesity due to excess calories with serious comorbidity and body mass index (BMI) of 36.0 to 36.9 in adult Assessment & Plan: He is encouraged to strive for BMI less than 30 to decrease cardiac risk. Advised to aim for at least 150 minutes of exercise per week.     Return if symptoms worsen or fail to improve.  Patient was given opportunity to ask questions. Patient verbalized understanding of the  plan and was able to repeat key elements of the plan. All questions were answered to their satisfaction.   I, [name] LOISE Slocumb, MD, have reviewed all documentation for this visit. The documentation on 10/31/23 for the exam, diagnosis, procedures, and orders are all accurate and complete.   IF YOU HAVE BEEN REFERRED TO A SPECIALIST, IT MAY TAKE 1-2 WEEKS TO SCHEDULE/PROCESS THE REFERRAL. IF YOU HAVE NOT HEARD FROM US /SPECIALIST IN TWO WEEKS, PLEASE GIVE US  A CALL AT [phone].   THE PATIENT IS ENCOURAGED TO PRACTICE SOCIAL DISTANCING DUE TO THE COVID-19 PANDEMIC.

## 2023-11-01 LAB — CBC
Hematocrit: 46.1 % (ref 37.5–51.0)
Hemoglobin: 14.5 g/dL (ref 13.0–17.7)
MCH: 27 pg (ref 26.6–33.0)
MCHC: 31.5 g/dL (ref 31.5–35.7)
MCV: 86 fL (ref 79–97)
Platelets: 345 x10E3/uL (ref 150–450)
RBC: 5.37 x10E6/uL (ref 4.14–5.80)
RDW: 13.3 % (ref 11.6–15.4)
WBC: 5.6 x10E3/uL (ref 3.4–10.8)

## 2023-11-01 LAB — CMP14+EGFR
ALT: 74 IU/L — ABNORMAL HIGH (ref 0–44)
AST: 35 IU/L (ref 0–40)
Albumin: 4.3 g/dL (ref 3.8–4.9)
Alkaline Phosphatase: 106 IU/L (ref 47–123)
BUN/Creatinine Ratio: 9 (ref 9–20)
BUN: 12 mg/dL (ref 6–24)
Bilirubin Total: 0.6 mg/dL (ref 0.0–1.2)
CO2: 24 mmol/L (ref 20–29)
Calcium: 9.5 mg/dL (ref 8.7–10.2)
Chloride: 102 mmol/L (ref 96–106)
Creatinine, Ser: 1.29 mg/dL — ABNORMAL HIGH (ref 0.76–1.27)
Globulin, Total: 2.9 g/dL (ref 1.5–4.5)
Glucose: 77 mg/dL (ref 70–99)
Potassium: 4.5 mmol/L (ref 3.5–5.2)
Sodium: 141 mmol/L (ref 134–144)
Total Protein: 7.2 g/dL (ref 6.0–8.5)
eGFR: 67 mL/min/1.73 (ref >59)

## 2023-11-06 DIAGNOSIS — R197 Diarrhea, unspecified: Secondary | ICD-10-CM | POA: Insufficient documentation

## 2023-11-06 NOTE — Assessment & Plan Note (Signed)
 He is encouraged to strive for BMI less than 30 to decrease cardiac risk. Advised to aim for at least 150 minutes of exercise per week.

## 2023-11-06 NOTE — Assessment & Plan Note (Signed)
 Chronic, most recent GI note reviewed.  - He agrees to GI eval if sx persist/worsen or if blood in stool

## 2023-11-06 NOTE — Assessment & Plan Note (Signed)
 Acute diarrhea likely dietary-related, not typical of patient's usual Crohn's flare symptoms. - Perform rectal exam for blood in stool. - Order CBC and electrolytes. - Advise increased fluid intake, including water and Gatorade. - Recommend dietary modifications, including baked or mashed potatoes for potassium. - Contact GI specialist Dr. Gwendlyn Buddy if blood in stool or symptoms worsen. - Stool is hemoccult negative

## 2023-11-16 ENCOUNTER — Ambulatory Visit: Payer: Self-pay | Admitting: Internal Medicine

## 2023-11-16 DIAGNOSIS — R748 Abnormal levels of other serum enzymes: Secondary | ICD-10-CM

## 2023-12-15 ENCOUNTER — Ambulatory Visit: Admitting: Gastroenterology

## 2023-12-15 ENCOUNTER — Other Ambulatory Visit (INDEPENDENT_AMBULATORY_CARE_PROVIDER_SITE_OTHER)

## 2023-12-15 ENCOUNTER — Encounter: Payer: Self-pay | Admitting: Gastroenterology

## 2023-12-15 VITALS — BP 118/78 | HR 81 | Ht 69.0 in | Wt 252.0 lb

## 2023-12-15 DIAGNOSIS — K50819 Crohn's disease of both small and large intestine with unspecified complications: Secondary | ICD-10-CM | POA: Diagnosis not present

## 2023-12-15 DIAGNOSIS — R7989 Other specified abnormal findings of blood chemistry: Secondary | ICD-10-CM | POA: Diagnosis not present

## 2023-12-15 DIAGNOSIS — K5080 Crohn's disease of both small and large intestine without complications: Secondary | ICD-10-CM

## 2023-12-15 LAB — COMPREHENSIVE METABOLIC PANEL WITH GFR
ALT: 25 U/L (ref 0–53)
AST: 18 U/L (ref 0–37)
Albumin: 4.3 g/dL (ref 3.5–5.2)
Alkaline Phosphatase: 67 U/L (ref 39–117)
BUN: 11 mg/dL (ref 6–23)
CO2: 32 meq/L (ref 19–32)
Calcium: 9.4 mg/dL (ref 8.4–10.5)
Chloride: 104 meq/L (ref 96–112)
Creatinine, Ser: 1.32 mg/dL (ref 0.40–1.50)
GFR: 62.05 mL/min (ref >60.00)
Glucose, Bld: 89 mg/dL (ref 70–99)
Potassium: 4.1 meq/L (ref 3.5–5.1)
Sodium: 141 meq/L (ref 135–145)
Total Bilirubin: 1.2 mg/dL (ref 0.2–1.2)
Total Protein: 7.2 g/dL (ref 6.0–8.3)

## 2023-12-15 LAB — CBC WITH DIFFERENTIAL/PLATELET
Basophils Absolute: 0.1 K/uL (ref 0.0–0.1)
Basophils Relative: 1.1 % (ref 0.0–3.0)
Eosinophils Absolute: 0.1 K/uL (ref 0.0–0.7)
Eosinophils Relative: 1.7 % (ref 0.0–5.0)
HCT: 41.6 % (ref 39.0–52.0)
Hemoglobin: 13.9 g/dL (ref 13.0–17.0)
Lymphocytes Relative: 29.7 % (ref 12.0–46.0)
Lymphs Abs: 1.5 K/uL (ref 0.7–4.0)
MCHC: 33.5 g/dL (ref 30.0–36.0)
MCV: 81.5 fl (ref 78.0–100.0)
Monocytes Absolute: 0.5 K/uL (ref 0.1–1.0)
Monocytes Relative: 11 % (ref 3.0–12.0)
Neutro Abs: 2.8 K/uL (ref 1.4–7.7)
Neutrophils Relative %: 56.5 % (ref 43.0–77.0)
Platelets: 274 K/uL (ref 150.0–400.0)
RBC: 5.11 Mil/uL (ref 4.22–5.81)
RDW: 14.4 % (ref 11.5–15.5)
WBC: 4.9 K/uL (ref 4.0–10.5)

## 2023-12-15 LAB — C-REACTIVE PROTEIN: CRP: 0.7 mg/dL (ref 0.5–20.0)

## 2023-12-15 LAB — SEDIMENTATION RATE: Sed Rate: 11 mm/h (ref 0–20)

## 2023-12-15 NOTE — Patient Instructions (Signed)
 _______________________________________________________  If your blood pressure at your visit was 140/90 or greater, please contact your primary care physician to follow up on this.  _______________________________________________________  If you are age 52 or older, your body mass index should be between 23-30. Your Body mass index is 37.21 kg/m. If this is out of the aforementioned range listed, please consider follow up with your Primary Care Provider.  If you are age 13 or younger, your body mass index should be between 19-25. Your Body mass index is 37.21 kg/m. If this is out of the aformentioned range listed, please consider follow up with your Primary Care Provider.   ________________________________________________________  The [facility] providers would like to encourage you to use MYCHART to communicate with providers for non-urgent requests or questions.  Due to long hold times on the telephone, sending your provider a message by [facility] may be a faster and more efficient way to get a response.  Please allow 48 business hours for a response.  Please remember that this is for non-urgent requests.  _______________________________________________________  [facility] is using a team-based approach to care.  Your team is made up of your doctor and two to three APPS. Our APPS (Nurse Practitioners and Physician Assistants) work with your physician to ensure care continuity for you. They are fully qualified to address your health concerns and develop a treatment plan. They communicate directly with your gastroenterologist to care for you. Seeing the Advanced Practice Practitioners on your physician's team can help you by facilitating care more promptly, often allowing for earlier appointments, access to diagnostic testing, procedures, and other specialty referrals.   Your provider has requested that you go to the basement level for lab work before leaving today. Press B on the  elevator. The lab is located at the first door on the left as you exit the elevator.  Due to recent changes in healthcare laws, you may see the results of your imaging and laboratory studies on MyChart before your provider has had a chance to review them.  We understand that in some cases there may be results that are confusing or concerning to you. Not all laboratory results come back in the same time frame and the provider may be waiting for multiple results in order to interpret others.  Please give us  48 hours in order for your provider to thoroughly review all the results before contacting the office for clarification of your results.

## 2023-12-15 NOTE — Progress Notes (Signed)
 Chief Complaint: Crohn's disease Primary GI MD: Dr. Aneita  HPI: 53 year old male history of Crohn's ileocolitis s/p ileocecectomy with colostomy and then reversal in 2007 presents for Crohn's flare  Last seen July 2022 by Dr. Aneita and at that time he was having 3 days of diarrhea thought to be viral/food related  Colonoscopy 05/2019 with minimally active ileitis.  He had been off IBD medications for years and was recommended to continue to observe off medications.  Due for repeat colonoscopy 05/2024.  Discussed the use of AI scribe software for clinical note transcription with the patient, who gave verbal consent to proceed.  History of Present Illness  He has been experiencing gastrointestinal symptoms, initially suspecting a Crohn's disease flare-up. He describes alternating episodes of diarrhea and constipation, followed by a significant amount of diarrhea. He attributes these symptoms to consuming something bad. The symptoms persisted for a couple of weeks before resolving spontaneously.  During this episode, he did not experience the usual symptoms associated with his Crohn's disease flare-ups, such as anemia or extreme fatigue. No blood was observed in his stool, and he only experienced minimal abdominal pain.  His current medications include pantoprazole  and metoprolol , which are not specifically for Crohn's disease. He has not been on any specific treatment for Crohn's since 2007.   PREVIOUS GI WORKUP   Colonoscopy 05/2019 - Patent end-to-side ileo-colonic anastomosis, characterized by healthy appearing mucosa. - A few erosions in the neo-terminal ileum. Biopsied. Minimally active ileitis.  - Internal hemorrhoids. - The examination was otherwise normal on direct and retroflexion views. Random biopsies obtained. Normal.  Past Medical History:  Diagnosis Date   Anemia    iron def   Anxiety    B12 deficiency    BMI 38.0-38.9,adult 03/21/2023   Crohn's ileocolitis (HCC)     6mp since 2007   Hemorrhoids    Hypertension    Prehypertension 03/09/2013   Pulmonary embolism (HCC)    Rectal discomfort 05/10/2019    Past Surgical History:  Procedure Laterality Date   COLONOSCOPY     EXTERNAL FIXATION LEG Left 02/27/2013   Procedure: EXTERNAL FIXATION LEFT KNEE ;  Surgeon: Reyes JAYSON Billing, MD;  Location: [facility] OR;  Service: Orthopedics;  Laterality: Left;   EXTERNAL FIXATION LEG Left 03/01/2013   Procedure: REVISION OF EXTERNAL FIXATOR LEFT LEG;  Surgeon: Ozell VEAR Bruch, MD;  Location: [facility] OR;  Service: Orthopedics;  Laterality: Left;   ileocecal resection  03/2005   ILEOSTOMY CLOSURE  08/2005   ileostomy/anastomotic resection for anastomotic leak  03/2005   KNEE ARTHROSCOPY Left 06/25/2013   Procedure: LYSIS OF ADHESION LEFT KNEE;  Surgeon: Ozell VEAR Bruch, MD;  Location: [facility] OR;  Service: Orthopedics;  Laterality: Left;   LYSIS OF ADHESION Left 06/25/2013   WITH MANIPULATION     DR HANDY   ORIF TIBIA FRACTURE Left 03/08/2013   Procedure: LEFT OPEN REDUCTION INTERNAL FIXATION (ORIF) TIBIA FRACTURE;  Surgeon: Ozell VEAR Bruch, MD;  Location: [facility] OR;  Service: Orthopedics;  Laterality: Left;    Current Outpatient Medications  Medication Sig Dispense Refill   aspirin EC 81 MG tablet Take 81 mg by mouth daily.     ferrous sulfate  325 (65 FE) MG tablet Take 325 mg by mouth daily with breakfast.     metoprolol  succinate (TOPROL -XL) 25 MG 24 hr tablet Take 1 tablet (25 mg total) by mouth daily. 90 tablet 1   Multiple Vitamin (MULTIVITAMIN WITH MINERALS) TABS tablet Take 1 tablet by mouth once a  week.     pantoprazole  (PROTONIX ) 40 MG tablet Take 1 tablet (40 mg total) by mouth daily. 90 tablet 1   Vitamin D , Ergocalciferol , (DRISDOL ) 1.25 MG (50000 UNIT) CAPS capsule TAKE 1 CAPSULE BY MOUTH ONE TIME PER WEEK 12 capsule 1   No current facility-administered medications for this visit.    Allergies as of 12/15/2023   (No Known Allergies)    Family History  Problem Relation  Age of Onset   Breast cancer Mother    Diabetes Father    COPD Father    Crohn's disease Other    Colon cancer Neg Hx    Rectal cancer Neg Hx    Stomach cancer Neg Hx     Social History   Socioeconomic History   Marital status: Married    Spouse name: Not on file   Number of children: 0   Years of education: Not on file   Highest education level: Not on file  Occupational History   Occupation: Radiographer, Therapeutic: [employer]  Tobacco Use   Smoking status: Never   Smokeless tobacco: Never  Vaping Use   Vaping status: Never Used  Substance and Sexual Activity   Alcohol use: No   Drug use: No   Sexual activity: Yes    Birth control/protection: None, Condom  Other Topics Concern   Not on file  Social History Narrative   Patient does not get regular exercise. Daily caffeine use sodas-4 cups daily.   Social Drivers of Health   Tobacco Use: Low Risk (12/15/2023)   Patient History    Smoking Tobacco Use: Never    Smokeless Tobacco Use: Never    Passive Exposure: Not on file  Financial Resource Strain: Not on file  Food Insecurity: Not on file  Transportation Needs: Not on file  Physical Activity: Not on file  Stress: Not on file  Social Connections: Not on file  Intimate Partner Violence: Not on file  Depression (PHQ2-9): Low Risk (10/31/2023)   Depression (PHQ2-9)    PHQ-2 Score: 0  Alcohol Screen: Not on file  Housing: Not on file  Utilities: Not on file  Health Literacy: Not on file    Review of Systems:    Constitutional: No weight loss, fever, chills, weakness or fatigue HEENT: Eyes: No change in vision               Ears, Nose, Throat:  No change in hearing or congestion Skin: No rash or itching Cardiovascular: No chest pain, chest pressure or palpitations   Respiratory: No SOB or cough Gastrointestinal: See HPI and otherwise negative Genitourinary: No dysuria or change in urinary frequency Neurological: No headache, dizziness  or syncope Musculoskeletal: No new muscle or joint pain Hematologic: No bleeding or bruising Psychiatric: No history of depression or anxiety    Physical Exam:  Vital signs: BP 118/78   Pulse 81   Ht 5' 9 (1.753 m)   Wt 252 lb (114.3 kg)   BMI 37.21 kg/m   Constitutional: NAD, alert and cooperative Head:  Normocephalic and atraumatic. Eyes:   PEERL, EOMI. No icterus. Conjunctiva pink. Respiratory: Respirations even and unlabored. Lungs clear to auscultation bilaterally.   No wheezes, crackles, or rhonchi.  Cardiovascular:  Regular rate and rhythm. No peripheral edema, cyanosis or pallor.  Gastrointestinal:  Soft, nondistended, nontender. No rebound or guarding. Normal bowel sounds. No appreciable masses or hepatomegaly. Rectal:  Declines Msk:  Symmetrical without gross deformities. Without edema,  no deformity or joint abnormality.  Neurologic:  Alert and  oriented x4;  grossly normal neurologically.  Skin:   Dry and intact without significant lesions or rashes. Psychiatric: Oriented to person, place and time. Demonstrates good judgement and reason without abnormal affect or behaviors.  Physical Exam    RELEVANT LABS AND IMAGING: CBC    Component Value Date/Time   WBC 4.9 12/15/2023 1432   RBC 5.11 12/15/2023 1432   HGB 13.9 12/15/2023 1432   HGB 14.5 10/31/2023 1512   HCT 41.6 12/15/2023 1432   HCT 46.1 10/31/2023 1512   PLT 274.0 12/15/2023 1432   PLT 345 10/31/2023 1512   MCV 81.5 12/15/2023 1432   MCV 86 10/31/2023 1512   MCH 27.0 10/31/2023 1512   MCH 26.5 04/20/2017 0815   MCHC 33.5 12/15/2023 1432   RDW 14.4 12/15/2023 1432   RDW 13.3 10/31/2023 1512   LYMPHSABS 1.5 12/15/2023 1432   LYMPHSABS 1.2 09/22/2023 0923   MONOABS 0.5 12/15/2023 1432   EOSABS 0.1 12/15/2023 1432   EOSABS 0.1 09/22/2023 0923   BASOSABS 0.1 12/15/2023 1432   BASOSABS 0.0 09/22/2023 0923    CMP     Component Value Date/Time   NA 141 12/15/2023 1432   NA 141 10/31/2023 1512    K 4.1 12/15/2023 1432   CL 104 12/15/2023 1432   CO2 32 12/15/2023 1432   GLUCOSE 89 12/15/2023 1432   BUN 11 12/15/2023 1432   BUN 12 10/31/2023 1512   CREATININE 1.32 12/15/2023 1432   CALCIUM 9.4 12/15/2023 1432   CALCIUM 8.5 03/09/2013 0930   PROT 7.2 12/15/2023 1432   PROT 7.2 10/31/2023 1512   ALBUMIN 4.3 12/15/2023 1432   ALBUMIN 4.3 10/31/2023 1512   AST 18 12/15/2023 1432   ALT 25 12/15/2023 1432   ALKPHOS 67 12/15/2023 1432   BILITOT 1.2 12/15/2023 1432   BILITOT 0.6 10/31/2023 1512   GFRNONAA 64 02/06/2020 1229   GFRAA 74 02/06/2020 1229     Assessment/Plan:   History of Crohn's ileocolitis s/p ileocecectomy with colostomy and colostomy reversal 2007 Off IBD medications for years.  Colonoscopy 2021 with minimally active ileitis.  Has not been seen since 2022.  He thought he was having a flare early November when he started having diarrhea and abdominal pain for a few days but this resolved and he attributed it to bad food he had consumed.  He is feeling good at this time with no issues.  Unremarkable lab work 10/31/2023 and negative fecal occult - CBC, CMP, CRP/ESR - Fecal calprotectin - Can be seen on an as needed basis as we will have baseline labs per above.  If no issues his next appointment will be next year for his screening colonoscopy which will be May 2026  Elevated LFTs Lab work October 2025 with ALT 74.  We will draw liver labs today and if still elevated we will pursue elevated LFT workup including RUQ ultrasound - CMP - CBC  assigned to Dr. Albertus today  Nestor Blower, PA-C [facility] 12/15/2023, 3:55 PM  Cc: Georgina Speaks, FNP

## 2023-12-16 ENCOUNTER — Ambulatory Visit: Payer: Self-pay | Admitting: Gastroenterology

## 2024-03-21 ENCOUNTER — Ambulatory Visit: Payer: Self-pay | Admitting: Nurse Practitioner

## 2024-09-27 ENCOUNTER — Encounter: Payer: Self-pay | Admitting: Nurse Practitioner
